# Patient Record
Sex: Male | Born: 1960 | Race: White | Hispanic: No | Smoking: Current every day smoker
Health system: Southern US, Community
[De-identification: ages and names within clinical notes are randomized; demographics above are authoritative.]

## PROBLEM LIST (undated history)

## (undated) DIAGNOSIS — F4310 Post-traumatic stress disorder, unspecified: Secondary | ICD-10-CM

## (undated) DIAGNOSIS — I639 Cerebral infarction, unspecified: Secondary | ICD-10-CM

## (undated) DIAGNOSIS — J449 Chronic obstructive pulmonary disease, unspecified: Secondary | ICD-10-CM

## (undated) DIAGNOSIS — F319 Bipolar disorder, unspecified: Secondary | ICD-10-CM

## (undated) HISTORY — PX: HIATAL HERNIA REPAIR: SHX195

## (undated) HISTORY — PX: BACK SURGERY: SHX140

## (undated) HISTORY — PX: APPENDECTOMY: SHX54

---

## 2000-09-18 ENCOUNTER — Emergency Department (HOSPITAL_COMMUNITY): Admission: EM | Admit: 2000-09-18 | Discharge: 2000-09-18 | Payer: Self-pay | Admitting: Emergency Medicine

## 2002-08-19 ENCOUNTER — Encounter: Payer: Self-pay | Admitting: Emergency Medicine

## 2002-08-19 ENCOUNTER — Emergency Department (HOSPITAL_COMMUNITY): Admission: EM | Admit: 2002-08-19 | Discharge: 2002-08-19 | Payer: Self-pay | Admitting: Emergency Medicine

## 2002-09-26 ENCOUNTER — Encounter: Payer: Self-pay | Admitting: Family Medicine

## 2002-09-26 ENCOUNTER — Ambulatory Visit (HOSPITAL_COMMUNITY): Admission: RE | Admit: 2002-09-26 | Discharge: 2002-09-26 | Payer: Self-pay | Admitting: Family Medicine

## 2003-05-04 ENCOUNTER — Inpatient Hospital Stay (HOSPITAL_COMMUNITY): Admission: AC | Admit: 2003-05-04 | Discharge: 2003-05-04 | Payer: Self-pay

## 2003-08-27 ENCOUNTER — Ambulatory Visit (HOSPITAL_COMMUNITY): Admission: RE | Admit: 2003-08-27 | Discharge: 2003-08-27 | Payer: Self-pay | Admitting: Family Medicine

## 2004-06-23 ENCOUNTER — Emergency Department (HOSPITAL_COMMUNITY): Admission: EM | Admit: 2004-06-23 | Discharge: 2004-06-23 | Payer: Self-pay | Admitting: Emergency Medicine

## 2005-02-04 ENCOUNTER — Emergency Department (HOSPITAL_COMMUNITY): Admission: EM | Admit: 2005-02-04 | Discharge: 2005-02-04 | Payer: Self-pay | Admitting: *Deleted

## 2006-12-02 ENCOUNTER — Emergency Department (HOSPITAL_COMMUNITY): Admission: EM | Admit: 2006-12-02 | Discharge: 2006-12-02 | Payer: Self-pay | Admitting: Emergency Medicine

## 2007-01-22 ENCOUNTER — Emergency Department (HOSPITAL_COMMUNITY): Admission: EM | Admit: 2007-01-22 | Discharge: 2007-01-22 | Payer: Self-pay | Admitting: Emergency Medicine

## 2007-09-22 ENCOUNTER — Emergency Department (HOSPITAL_COMMUNITY): Admission: EM | Admit: 2007-09-22 | Discharge: 2007-09-22 | Payer: Self-pay | Admitting: Emergency Medicine

## 2007-09-25 ENCOUNTER — Emergency Department (HOSPITAL_COMMUNITY): Admission: EM | Admit: 2007-09-25 | Discharge: 2007-09-26 | Payer: Self-pay | Admitting: Emergency Medicine

## 2007-09-26 ENCOUNTER — Inpatient Hospital Stay (HOSPITAL_COMMUNITY): Admission: AD | Admit: 2007-09-26 | Discharge: 2007-10-02 | Payer: Self-pay | Admitting: Psychiatry

## 2007-09-26 ENCOUNTER — Ambulatory Visit: Payer: Self-pay | Admitting: Psychiatry

## 2008-05-29 ENCOUNTER — Emergency Department (HOSPITAL_COMMUNITY): Admission: EM | Admit: 2008-05-29 | Discharge: 2008-05-29 | Payer: Self-pay | Admitting: Emergency Medicine

## 2008-06-02 ENCOUNTER — Emergency Department (HOSPITAL_COMMUNITY): Admission: EM | Admit: 2008-06-02 | Discharge: 2008-06-02 | Payer: Self-pay | Admitting: Emergency Medicine

## 2008-06-10 ENCOUNTER — Emergency Department (HOSPITAL_COMMUNITY): Admission: EM | Admit: 2008-06-10 | Discharge: 2008-06-11 | Payer: Self-pay | Admitting: Emergency Medicine

## 2008-07-01 ENCOUNTER — Emergency Department (HOSPITAL_COMMUNITY): Admission: EM | Admit: 2008-07-01 | Discharge: 2008-07-01 | Payer: Self-pay | Admitting: Emergency Medicine

## 2008-10-03 ENCOUNTER — Emergency Department (HOSPITAL_COMMUNITY): Admission: EM | Admit: 2008-10-03 | Discharge: 2008-10-03 | Payer: Self-pay | Admitting: Emergency Medicine

## 2009-02-05 ENCOUNTER — Emergency Department (HOSPITAL_COMMUNITY): Admission: EM | Admit: 2009-02-05 | Discharge: 2009-02-05 | Payer: Self-pay | Admitting: Emergency Medicine

## 2009-04-05 ENCOUNTER — Emergency Department (HOSPITAL_COMMUNITY): Admission: EM | Admit: 2009-04-05 | Discharge: 2009-04-05 | Payer: Self-pay | Admitting: Emergency Medicine

## 2009-04-30 ENCOUNTER — Emergency Department (HOSPITAL_COMMUNITY): Admission: EM | Admit: 2009-04-30 | Discharge: 2009-04-30 | Payer: Self-pay | Admitting: Emergency Medicine

## 2009-08-01 ENCOUNTER — Emergency Department (HOSPITAL_COMMUNITY): Admission: EM | Admit: 2009-08-01 | Discharge: 2009-08-01 | Payer: Self-pay | Admitting: Emergency Medicine

## 2009-08-01 ENCOUNTER — Ambulatory Visit (HOSPITAL_COMMUNITY): Admission: RE | Admit: 2009-08-01 | Discharge: 2009-08-01 | Payer: Self-pay | Admitting: Emergency Medicine

## 2009-09-29 ENCOUNTER — Inpatient Hospital Stay (HOSPITAL_COMMUNITY): Admission: AD | Admit: 2009-09-29 | Discharge: 2009-10-02 | Payer: Self-pay | Admitting: Family Medicine

## 2009-11-12 ENCOUNTER — Emergency Department (HOSPITAL_COMMUNITY): Admission: EM | Admit: 2009-11-12 | Discharge: 2009-11-12 | Payer: Self-pay | Admitting: Emergency Medicine
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
## 2010-03-26 ENCOUNTER — Emergency Department (HOSPITAL_COMMUNITY)
Admission: EM | Admit: 2010-03-26 | Discharge: 2010-03-26 | Payer: Self-pay | Source: Home / Self Care | Admitting: Emergency Medicine

## 2010-05-14 ENCOUNTER — Emergency Department (HOSPITAL_COMMUNITY): Admission: EM | Admit: 2010-05-14 | Discharge: 2010-04-04 | Payer: Self-pay | Admitting: Emergency Medicine

## 2010-06-02 ENCOUNTER — Emergency Department (HOSPITAL_COMMUNITY)
Admission: EM | Admit: 2010-06-02 | Discharge: 2010-06-02 | Payer: Self-pay | Source: Home / Self Care | Admitting: Emergency Medicine

## 2010-06-02 ENCOUNTER — Ambulatory Visit (HOSPITAL_COMMUNITY): Admission: RE | Admit: 2010-06-02 | Payer: Self-pay | Source: Home / Self Care | Admitting: Family Medicine

## 2010-06-28 ENCOUNTER — Encounter: Payer: Self-pay | Admitting: Family Medicine

## 2010-08-19 ENCOUNTER — Emergency Department (HOSPITAL_COMMUNITY)
Admission: EM | Admit: 2010-08-19 | Discharge: 2010-08-19 | Disposition: A | Discharge status: Other Acute Inpatient Hospital | Payer: Medicare Other | Attending: Emergency Medicine | Admitting: Emergency Medicine

## 2010-08-19 DIAGNOSIS — F1010 Alcohol abuse, uncomplicated: Secondary | ICD-10-CM | POA: Insufficient documentation

## 2010-08-19 DIAGNOSIS — G8929 Other chronic pain: Secondary | ICD-10-CM | POA: Insufficient documentation

## 2010-08-19 DIAGNOSIS — F3289 Other specified depressive episodes: Secondary | ICD-10-CM | POA: Insufficient documentation

## 2010-08-19 DIAGNOSIS — F329 Major depressive disorder, single episode, unspecified: Secondary | ICD-10-CM | POA: Insufficient documentation

## 2010-08-19 LAB — COMPREHENSIVE METABOLIC PANEL
ALT: 13 U/L (ref 0–53)
ALT: 24 U/L (ref 0–53)
AST: 17 U/L (ref 0–37)
AST: 28 U/L (ref 0–37)
Alkaline Phosphatase: 60 U/L (ref 39–117)
Alkaline Phosphatase: 62 U/L (ref 39–117)
CO2: 21 mEq/L (ref 19–32)
Calcium: 8.9 mg/dL (ref 8.4–10.5)
GFR calc Af Amer: >60 mL/min (ref >60)
GFR calc Af Amer: >60 mL/min (ref >60)
GFR calc non Af Amer: >60 mL/min (ref >60)
Glucose, Bld: 81 mg/dL (ref 70–99)
Glucose, Bld: 96 mg/dL (ref 70–99)
Potassium: 3.8 mEq/L (ref 3.5–5.1)
Potassium: 3.9 mEq/L (ref 3.5–5.1)
Sodium: 134 mEq/L — ABNORMAL LOW (ref 135–145)
Sodium: 140 mEq/L (ref 135–145)
Total Protein: 6.9 g/dL (ref 6.0–8.3)

## 2010-08-19 LAB — DIFFERENTIAL
Basophils Relative: 1 % (ref 0–1)
Eosinophils Absolute: 0.3 10*3/uL (ref 0.0–0.7)
Eosinophils Relative: 4 % (ref 0–5)
Lymphocytes Relative: 20 % (ref 12–46)
Lymphs Abs: 1.4 10*3/uL (ref 0.7–4.0)
Lymphs Abs: 1.8 10*3/uL (ref 0.7–4.0)
Monocytes Absolute: 0.6 10*3/uL (ref 0.1–1.0)
Monocytes Relative: 8 % (ref 3–12)
Neutro Abs: 6.3 10*3/uL (ref 1.7–7.7)
Neutrophils Relative %: 66 % (ref 43–77)
Neutrophils Relative %: 70 % (ref 43–77)

## 2010-08-19 LAB — URINALYSIS, ROUTINE W REFLEX MICROSCOPIC
Bilirubin Urine: NEGATIVE
Nitrite: NEGATIVE
Protein, ur: NEGATIVE mg/dL
Specific Gravity, Urine: 1.01 (ref 1.005–1.030)
Urobilinogen, UA: 0.2 mg/dL (ref 0.0–1.0)

## 2010-08-19 LAB — CBC
HCT: 35.2 % — ABNORMAL LOW (ref 39.0–52.0)
HCT: 43.2 % (ref 39.0–52.0)
Hemoglobin: 12.1 g/dL — ABNORMAL LOW (ref 13.0–17.0)
Hemoglobin: 15.4 g/dL (ref 13.0–17.0)
MCHC: 34.5 g/dL (ref 30.0–36.0)
MCV: 84 fL (ref 78.0–100.0)
RBC: 5.14 MIL/uL (ref 4.22–5.81)
RDW: 13.1 % (ref 11.5–15.5)
WBC: 6.9 10*3/uL (ref 4.0–10.5)
WBC: 9.1 10*3/uL (ref 4.0–10.5)

## 2010-08-19 LAB — RAPID URINE DRUG SCREEN, HOSP PERFORMED
Amphetamines: NOT DETECTED
Benzodiazepines: POSITIVE — AB
Benzodiazepines: NOT DETECTED
Opiates: NOT DETECTED
Tetrahydrocannabinol: POSITIVE — AB
Tetrahydrocannabinol: POSITIVE — AB

## 2010-08-19 LAB — LIPASE, BLOOD: Lipase: 40 U/L (ref 11–59)

## 2010-08-19 LAB — PROTIME-INR: Prothrombin Time: 14.3 seconds (ref 11.6–15.2)

## 2010-08-19 LAB — LITHIUM LEVEL: Lithium Lvl: <0.25 mEq/L — ABNORMAL LOW (ref 0.80–1.40)

## 2010-08-19 LAB — ETHANOL: Alcohol, Ethyl (B): <5 mg/dL (ref 0–10)

## 2010-08-25 LAB — CBC
Hemoglobin: 12.4 g/dL — ABNORMAL LOW (ref 13.0–17.0)
MCHC: 35.3 g/dL (ref 30.0–36.0)
RBC: 3.91 MIL/uL — ABNORMAL LOW (ref 4.22–5.81)
WBC: 13.6 10*3/uL — ABNORMAL HIGH (ref 4.0–10.5)

## 2010-08-25 LAB — COMPREHENSIVE METABOLIC PANEL
ALT: 17 U/L (ref 0–53)
AST: 23 U/L (ref 0–37)
Alkaline Phosphatase: 91 U/L (ref 39–117)
CO2: 28 mEq/L (ref 19–32)
Calcium: 8.6 mg/dL (ref 8.4–10.5)
Chloride: 90 mEq/L — ABNORMAL LOW (ref 96–112)
GFR calc Af Amer: >60 mL/min (ref >60)
GFR calc non Af Amer: >60 mL/min (ref >60)
Glucose, Bld: 114 mg/dL — ABNORMAL HIGH (ref 70–99)
Potassium: 3.5 mEq/L (ref 3.5–5.1)
Sodium: 128 mEq/L — ABNORMAL LOW (ref 135–145)

## 2010-08-25 LAB — DIFFERENTIAL
Basophils Relative: 0 % (ref 0–1)
Eosinophils Absolute: 0.2 10*3/uL (ref 0.0–0.7)
Eosinophils Relative: 1 % (ref 0–5)
Lymphs Abs: 0.8 10*3/uL (ref 0.7–4.0)
Monocytes Relative: 8 % (ref 3–12)
Neutrophils Relative %: 85 % — ABNORMAL HIGH (ref 43–77)

## 2010-08-25 LAB — CULTURE, BLOOD (ROUTINE X 2)
Culture: NO GROWTH
Report Status: 4302011
Report Status: 4302011

## 2010-08-25 LAB — TSH: TSH: 2.062 u[IU]/mL (ref 0.350–4.500)

## 2010-08-25 LAB — URINALYSIS, ROUTINE W REFLEX MICROSCOPIC
Glucose, UA: NEGATIVE mg/dL
Ketones, ur: NEGATIVE mg/dL
Leukocytes, UA: NEGATIVE
Nitrite: NEGATIVE
Specific Gravity, Urine: 1.025 (ref 1.005–1.030)
pH: 6 (ref 5.0–8.0)

## 2010-08-25 LAB — APTT: aPTT: 42 seconds — ABNORMAL HIGH (ref 24–37)

## 2010-08-25 LAB — URINE MICROSCOPIC-ADD ON

## 2010-08-26 LAB — BASIC METABOLIC PANEL
BUN: 23 mg/dL (ref 6–23)
Calcium: 8.6 mg/dL (ref 8.4–10.5)
Creatinine, Ser: 0.86 mg/dL (ref 0.4–1.5)
GFR calc non Af Amer: >60 mL/min (ref >60)
Glucose, Bld: 91 mg/dL (ref 70–99)

## 2010-08-26 LAB — DIFFERENTIAL
Basophils Absolute: 0.1 10*3/uL (ref 0.0–0.1)
Eosinophils Relative: 4 % (ref 0–5)
Lymphocytes Relative: 19 % (ref 12–46)
Neutro Abs: 6.4 10*3/uL (ref 1.7–7.7)
Neutrophils Relative %: 67 % (ref 43–77)

## 2010-08-26 LAB — CBC
Platelets: 236 10*3/uL (ref 150–400)
RDW: 14.1 % (ref 11.5–15.5)

## 2010-08-26 LAB — ETHANOL: Alcohol, Ethyl (B): <5 mg/dL (ref 0–10)

## 2010-08-26 LAB — RAPID URINE DRUG SCREEN, HOSP PERFORMED
Amphetamines: NOT DETECTED
Cocaine: NOT DETECTED
Opiates: POSITIVE — AB
Tetrahydrocannabinol: POSITIVE — AB
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
## 2011-03-02 LAB — CBC
Hemoglobin: 14.4
MCHC: 34.2
MCV: 91.4
RBC: 4.61

## 2011-03-02 LAB — PROTIME-INR
INR: 1.7 — ABNORMAL HIGH
INR: 1.1
INR: 1.1
INR: 1.3
Prothrombin Time: 14.3
Prothrombin Time: 14.8
Prothrombin Time: 16.3 — ABNORMAL HIGH

## 2011-03-02 LAB — RAPID URINE DRUG SCREEN, HOSP PERFORMED
Barbiturates: NOT DETECTED
Benzodiazepines: POSITIVE — AB
Cocaine: NOT DETECTED
Opiates: POSITIVE — AB

## 2011-03-02 LAB — DIFFERENTIAL
Basophils Absolute: 0
Basophils Relative: 0
Lymphocytes Relative: 9 — ABNORMAL LOW
Monocytes Absolute: 1.1 — ABNORMAL HIGH
Neutro Abs: 9.8 — ABNORMAL HIGH
Neutrophils Relative %: 81 — ABNORMAL HIGH

## 2011-03-02 LAB — BASIC METABOLIC PANEL
CO2: 26
Chloride: 99
Creatinine, Ser: 0.76
GFR calc Af Amer: >60

## 2011-11-08 ENCOUNTER — Emergency Department (HOSPITAL_COMMUNITY): Payer: Medicare Other

## 2011-11-08 ENCOUNTER — Encounter (HOSPITAL_COMMUNITY): Payer: Self-pay | Admitting: *Deleted

## 2011-11-08 ENCOUNTER — Emergency Department (HOSPITAL_COMMUNITY)
Admission: EM | Admit: 2011-11-08 | Discharge: 2011-11-09 | Disposition: A | Discharge status: Other Acute Inpatient Hospital | Payer: Medicare Other | Attending: Emergency Medicine | Admitting: Emergency Medicine

## 2011-11-08 DIAGNOSIS — R4182 Altered mental status, unspecified: Secondary | ICD-10-CM | POA: Insufficient documentation

## 2011-11-08 DIAGNOSIS — R45851 Suicidal ideations: Secondary | ICD-10-CM | POA: Insufficient documentation

## 2011-11-08 DIAGNOSIS — J3489 Other specified disorders of nose and nasal sinuses: Secondary | ICD-10-CM | POA: Insufficient documentation

## 2011-11-08 DIAGNOSIS — S51809A Unspecified open wound of unspecified forearm, initial encounter: Secondary | ICD-10-CM | POA: Insufficient documentation

## 2011-11-08 DIAGNOSIS — M79609 Pain in unspecified limb: Secondary | ICD-10-CM | POA: Insufficient documentation

## 2011-11-08 DIAGNOSIS — X789XXA Intentional self-harm by unspecified sharp object, initial encounter: Secondary | ICD-10-CM | POA: Insufficient documentation

## 2011-11-08 DIAGNOSIS — IMO0002 Reserved for concepts with insufficient information to code with codable children: Secondary | ICD-10-CM | POA: Insufficient documentation

## 2011-11-08 DIAGNOSIS — F22 Delusional disorders: Secondary | ICD-10-CM | POA: Insufficient documentation

## 2011-11-08 LAB — COMPREHENSIVE METABOLIC PANEL
ALT: 17 U/L (ref 0–53)
Alkaline Phosphatase: 85 U/L (ref 39–117)
BUN: 9 mg/dL (ref 6–23)
Chloride: 100 mEq/L (ref 96–112)
GFR calc Af Amer: >90 mL/min (ref >90)
Glucose, Bld: 111 mg/dL — ABNORMAL HIGH (ref 70–99)
Potassium: 3.5 mEq/L (ref 3.5–5.1)
Sodium: 136 mEq/L (ref 135–145)
Total Bilirubin: 0.5 mg/dL (ref 0.3–1.2)
Total Protein: 7.1 g/dL (ref 6.0–8.3)

## 2011-11-08 LAB — URINALYSIS, ROUTINE W REFLEX MICROSCOPIC
Ketones, ur: NEGATIVE mg/dL
Leukocytes, UA: NEGATIVE
Nitrite: NEGATIVE
Protein, ur: NEGATIVE mg/dL
pH: 6.5 (ref 5.0–8.0)

## 2011-11-08 LAB — DIFFERENTIAL
Eosinophils Absolute: 0.2 10*3/uL (ref 0.0–0.7)
Lymphocytes Relative: 23 % (ref 12–46)
Lymphs Abs: 1.7 10*3/uL (ref 0.7–4.0)
Monocytes Relative: 9 % (ref 3–12)
Neutro Abs: 4.7 10*3/uL (ref 1.7–7.7)
Neutrophils Relative %: 65 % (ref 43–77)

## 2011-11-08 LAB — URINE MICROSCOPIC-ADD ON

## 2011-11-08 LAB — CBC
Hemoglobin: 13.5 g/dL (ref 13.0–17.0)
Platelets: 269 10*3/uL (ref 150–400)
RBC: 4.53 MIL/uL (ref 4.22–5.81)
WBC: 7.3 10*3/uL (ref 4.0–10.5)

## 2011-11-08 LAB — RAPID URINE DRUG SCREEN, HOSP PERFORMED
Amphetamines: NOT DETECTED
Barbiturates: NOT DETECTED
Benzodiazepines: NOT DETECTED

## 2011-11-08 LAB — ACETAMINOPHEN LEVEL: Acetaminophen (Tylenol), Serum: <15 ug/mL (ref 10–30)

## 2011-11-08 MED ORDER — HALOPERIDOL LACTATE 5 MG/ML IJ SOLN
5.0000 mg | Freq: Once | INTRAMUSCULAR | Status: AC
  Administered 2011-11-08: 5 mg via INTRAMUSCULAR
  Filled 2011-11-08 (×2): qty 1

## 2011-11-08 MED ORDER — TETANUS-DIPHTH-ACELL PERTUSSIS 5-2.5-18.5 LF-MCG/0.5 IM SUSP
0.5000 mL | Freq: Once | INTRAMUSCULAR | Status: AC
  Administered 2011-11-08: 0.5 mL via INTRAMUSCULAR
  Filled 2011-11-08: qty 0.5

## 2011-11-08 MED ORDER — ZIPRASIDONE MESYLATE 20 MG IM SOLR
20.0000 mg | Freq: Once | INTRAMUSCULAR | Status: AC
  Administered 2011-11-08: 20 mg via INTRAMUSCULAR
  Filled 2011-11-08: qty 20

## 2011-11-08 MED ORDER — LORAZEPAM 2 MG/ML IJ SOLN
2.0000 mg | Freq: Once | INTRAMUSCULAR | Status: AC
  Administered 2011-11-08: 2 mg via INTRAMUSCULAR
  Filled 2011-11-08: qty 1

## 2011-11-08 MED ORDER — IBUPROFEN 400 MG PO TABS
600.0000 mg | ORAL_TABLET | Freq: Three times a day (TID) | ORAL | Status: DC | PRN
  Administered 2011-11-09: 600 mg via ORAL
  Filled 2011-11-08: qty 2

## 2011-11-08 MED ORDER — LORAZEPAM 1 MG PO TABS: 1.0000 mg | ORAL_TABLET | Freq: Three times a day (TID) | ORAL | Status: DC | PRN
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
## 2011-11-08 NOTE — ED Notes (Signed)
Pt calmer, aroused by voice and responds back at times, able to state that he is at hospital, states that month is May, when asked what year it is, pt will not answer
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
## 2011-11-08 NOTE — ED Notes (Signed)
Pt woke up and went into hallway into another pt's room, pt. Sat down in the other pt's room, [employer] assisted pt back to pt's room, pt became very verbal and combative towards officer, pt now back in room and is in bed

## 2011-11-08 NOTE — ED Notes (Signed)
Pt continuously talking nonstop, at times verbally threatening towards staff and to officer

## 2011-11-08 NOTE — ED Notes (Signed)
Pt much more calmer and resting in bed at this time, lab in room for blood work, [employer] remains at bedside
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
## 2011-11-08 NOTE — ED Notes (Signed)
Tommy with ACT in at pt's bedside
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
## 2011-11-09 LAB — PROTIME-INR: INR: 1.04 (ref 0.00–1.49)
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
## 2011-11-09 NOTE — ED Provider Notes (Signed)
Pt accepted to [facility].  Will transfer stable.   Laray Anger, DO 11/09/11 1045

## 2011-11-09 NOTE — Clinical Social Work Psych Note (Signed)
Craig [name] declined by Dr. Allena Katz as too acute for the unit at [facility].
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
## 2011-11-09 NOTE — Clinical Social Work Psych Note (Signed)
Mr. Grayling Congress accepted to [facility] by Dr. Darra Lis. Transport will be by [facility]. Dr. Clarene Duke in agreement with this disposition.

## 2011-11-09 NOTE — Clinical Social Work Psych Note (Signed)
Called [facility] and spoke with [place] in intake regarding the status of Craig [name]. Jill Alexanders informed Clinical research associate that Dr. Betti Cruz had requested more lab work concerning clotting  Values before he would make a decision. Information was obtained from the chart and faxed to [facility].

## 2013-01-27 DIAGNOSIS — M549 Dorsalgia, unspecified: Secondary | ICD-10-CM

## 2013-02-08 ENCOUNTER — Emergency Department (HOSPITAL_COMMUNITY)
Admission: EM | Admit: 2013-02-08 | Discharge: 2013-02-09 | Disposition: A | Discharge status: Home / Self Care | Payer: Medicare Other | Attending: Emergency Medicine | Admitting: Emergency Medicine

## 2013-02-08 ENCOUNTER — Encounter (HOSPITAL_COMMUNITY): Payer: Self-pay | Admitting: Emergency Medicine

## 2013-02-08 DIAGNOSIS — F17200 Nicotine dependence, unspecified, uncomplicated: Secondary | ICD-10-CM | POA: Insufficient documentation

## 2013-02-08 DIAGNOSIS — L237 Allergic contact dermatitis due to plants, except food: Secondary | ICD-10-CM

## 2013-02-08 DIAGNOSIS — L255 Unspecified contact dermatitis due to plants, except food: Secondary | ICD-10-CM | POA: Insufficient documentation

## 2013-02-08 DIAGNOSIS — R21 Rash and other nonspecific skin eruption: Secondary | ICD-10-CM | POA: Insufficient documentation

## 2013-02-08 DIAGNOSIS — Z79899 Other long term (current) drug therapy: Secondary | ICD-10-CM | POA: Insufficient documentation

## 2013-02-08 MED ORDER — PREDNISONE 10 MG PO TABS: 20.0000 mg | ORAL_TABLET | Freq: Every day | ORAL | Status: DC

## 2013-02-08 MED ORDER — DIPHENHYDRAMINE HCL 25 MG PO TABS: 25.0000 mg | ORAL_TABLET | Freq: Four times a day (QID) | ORAL | Status: DC

## 2013-02-08 MED ORDER — PREDNISONE 50 MG PO TABS
60.0000 mg | ORAL_TABLET | Freq: Once | ORAL | Status: AC
  Administered 2013-02-08: 60 mg via ORAL
  Filled 2013-02-08: qty 1

## 2013-02-08 MED ORDER — DIPHENHYDRAMINE HCL 25 MG PO CAPS
25.0000 mg | ORAL_CAPSULE | Freq: Once | ORAL | Status: AC
Start: 2013-02-08 — End: 2013-02-08
  Administered 2013-02-08: 25 mg via ORAL
  Filled 2013-02-08: qty 1

## 2013-02-08 MED ORDER — FAMOTIDINE 20 MG PO TABS: 20.0000 mg | ORAL_TABLET | Freq: Two times a day (BID) | ORAL | Status: DC

## 2013-02-08 MED ORDER — FAMOTIDINE 20 MG PO TABS
20.0000 mg | ORAL_TABLET | Freq: Once | ORAL | Status: AC
  Administered 2013-02-08: 20 mg via ORAL
  Filled 2013-02-08: qty 1

## 2013-02-08 NOTE — ED Notes (Signed)
Patient c/o poison ivy/oak x 1 week.  Patient has rash to neck, bilateral arms and legs.

## 2013-02-08 NOTE — ED Provider Notes (Signed)
CSN: [id number]     Arrival date & time 02/08/13  2312 History   First MD Initiated Contact with Patient 02/08/13 2319     Chief Complaint  Patient presents with  . Poison Ivy   (Consider location/radiation/quality/duration/timing/severity/associated sxs/prior Treatment) Patient is a 52 y.o. male presenting with poison ivy. The history is provided by the patient.  Poison Craig [name] This is a new problem. The current episode started in the past 7 days. The problem occurs constantly. The problem has been gradually worsening. Associated symptoms include a rash. Pertinent negatives include no chest pain, chills, coughing, fever, headaches, nausea, neck pain, sore throat, swollen glands or vomiting.   Craig [name] is a 52 y.o. male who presents to the ED with a rash that started a week ago. He states he was working in the yard cutting shrubs and got into poison ivy. He has been itching around his neck, on his arms and legs.   History reviewed. No pertinent past medical history. Past Surgical History  Procedure Laterality Date  . Back surgery     No family history on file. History  Substance Use Topics  . Smoking status: Current Every Day Smoker  . Smokeless tobacco: Not on file  . Alcohol Use: No    Review of Systems  Constitutional: Negative for fever and chills.  HENT: Negative for sore throat, trouble swallowing and neck pain.   Eyes: Negative for itching.  Respiratory: Negative for cough.   Cardiovascular: Negative for chest pain.  Gastrointestinal: Negative for nausea and vomiting.  Skin: Positive for rash.  Neurological: Negative for headaches.  Psychiatric/Behavioral: The patient is not nervous/anxious.     Allergies  Review of patient's allergies indicates no known allergies.  Home Medications   Current Outpatient Rx  Name  Route  Sig  Dispense  Refill  . Aspirin-Salicylamide-Caffeine (BC HEADACHE POWDER PO)   Oral   Take 1 Package by mouth as needed. For headache        . citalopram (CELEXA) 40 MG tablet   Oral   Take 40 mg by mouth 2 (two) times daily.         . clonazePAM (KLONOPIN) 1 MG tablet   Oral   Take 1 mg by mouth 2 (two) times daily as needed. For nerves         . fexofenadine (ALLEGRA) 60 MG tablet   Oral   Take 60 mg by mouth daily as needed. For allergies         . GABAPENTIN PO   Oral   Take 1 capsule by mouth 2 (two) times daily.         Marland Kitchen ibuprofen (ADVIL,MOTRIN) 200 MG tablet   Oral   Take 600 mg by mouth every 6 (six) hours as needed. For pain         . methadone (DOLOPHINE) 10 MG tablet   Oral   Take 10 mg by mouth 3 (three) times daily.         . methocarbamol (ROBAXIN) 500 MG tablet   Oral   Take 500 mg by mouth 2 (two) times daily.          BP 114/76  Pulse 79  Temp(Src) 97.3 F (36.3 C) (Oral)  Resp 18  Ht 5\' 7"  (1.702 m)  Wt 154 lb (69.854 kg)  BMI 24.11 kg/m2  SpO2 96% Physical Exam  Nursing note and vitals reviewed. Constitutional: He is oriented to person, place, and time. He appears well-developed and  well-nourished. No distress.  HENT:  Head: Normocephalic.  Mouth/Throat: Uvula is midline, oropharynx is clear and moist and mucous membranes are normal.  Eyes: Conjunctivae and EOM are normal.  Neck: Neck supple.  Cardiovascular: Normal rate and regular rhythm.   Pulmonary/Chest: Effort normal and breath sounds normal. He has no wheezes.  Abdominal: Soft. There is no tenderness.  Musculoskeletal: Normal range of motion.  Neurological: He is alert and oriented to person, place, and time. No cranial nerve deficit.  Skin: Rash noted.  Rash noted to neck, arms and legs. Linear and vesicular.   Psychiatric: He has a normal mood and affect.    ED Course: Patient given Prednisone, benadryl and Pepcid here in the ED  Procedures  MDM  52 y.o. male with rash and itching after exposure to poison ivy. Will treat for poison ivy. Discussed with the patient and all questioned fully answered.  He will return if any problems arise.    Medication List    TAKE these medications       diphenhydrAMINE 25 MG tablet  Commonly known as:  BENADRYL  Take 1 tablet (25 mg total) by mouth every 6 (six) hours.     famotidine 20 MG tablet  Commonly known as:  PEPCID  Take 1 tablet (20 mg total) by mouth 2 (two) times daily.     predniSONE 10 MG tablet  Commonly known as:  DELTASONE  Take 2 tablets (20 mg total) by mouth daily.      ASK your doctor about these medications       BC HEADACHE POWDER PO  Take 1 Package by mouth as needed. For headache     citalopram 40 MG tablet  Commonly known as:  CELEXA  Take 40 mg by mouth 2 (two) times daily.     clonazePAM 1 MG tablet  Commonly known as:  KLONOPIN  Take 1 mg by mouth 2 (two) times daily as needed. For nerves     fexofenadine 60 MG tablet  Commonly known as:  ALLEGRA  Take 60 mg by mouth daily as needed. For allergies     GABAPENTIN PO  Take 1 capsule by mouth 2 (two) times daily.     ibuprofen 200 MG tablet  Commonly known as:  ADVIL,MOTRIN  Take 600 mg by mouth every 6 (six) hours as needed. For pain     methadone 10 MG tablet  Commonly known as:  DOLOPHINE  Take 10 mg by mouth 3 (three) times daily.     methocarbamol 500 MG tablet  Commonly known as:  ROBAXIN  Take 500 mg by mouth 2 (two) times daily.           Janne Napoleon, [place] 02/08/13 [phone]

## 2013-02-08 NOTE — ED Provider Notes (Signed)
Medical screening examination/treatment/procedure(s) were performed by non-physician practitioner and as supervising physician I was immediately available for consultation/collaboration.   Joya Gaskins, MD 02/08/13 2351

## 2013-02-09 ENCOUNTER — Emergency Department (HOSPITAL_COMMUNITY)
Admission: EM | Admit: 2013-02-09 | Discharge: 2013-02-09 | Disposition: A | Discharge status: Home / Self Care | Payer: Medicare Other | Attending: Emergency Medicine | Admitting: Emergency Medicine

## 2013-02-09 ENCOUNTER — Encounter (HOSPITAL_COMMUNITY): Payer: Self-pay

## 2013-02-09 DIAGNOSIS — J449 Chronic obstructive pulmonary disease, unspecified: Secondary | ICD-10-CM | POA: Insufficient documentation

## 2013-02-09 DIAGNOSIS — F319 Bipolar disorder, unspecified: Secondary | ICD-10-CM | POA: Insufficient documentation

## 2013-02-09 DIAGNOSIS — Z79899 Other long term (current) drug therapy: Secondary | ICD-10-CM | POA: Insufficient documentation

## 2013-02-09 DIAGNOSIS — Z8673 Personal history of transient ischemic attack (TIA), and cerebral infarction without residual deficits: Secondary | ICD-10-CM | POA: Insufficient documentation

## 2013-02-09 DIAGNOSIS — J4489 Other specified chronic obstructive pulmonary disease: Secondary | ICD-10-CM | POA: Insufficient documentation

## 2013-02-09 DIAGNOSIS — IMO0002 Reserved for concepts with insufficient information to code with codable children: Secondary | ICD-10-CM | POA: Insufficient documentation

## 2013-02-09 DIAGNOSIS — F4310 Post-traumatic stress disorder, unspecified: Secondary | ICD-10-CM | POA: Insufficient documentation

## 2013-02-09 DIAGNOSIS — F17200 Nicotine dependence, unspecified, uncomplicated: Secondary | ICD-10-CM | POA: Insufficient documentation

## 2013-02-09 HISTORY — DX: Post-traumatic stress disorder, unspecified: F43.10

## 2013-02-09 HISTORY — DX: Chronic obstructive pulmonary disease, unspecified: J44.9

## 2013-02-09 HISTORY — DX: Bipolar disorder, unspecified: F31.9

## 2013-02-09 HISTORY — DX: Cerebral infarction, unspecified: I63.9

## 2013-02-09 LAB — CBC WITH DIFFERENTIAL/PLATELET
Basophils Absolute: 0 10*3/uL (ref 0.0–0.1)
HCT: 40.3 % (ref 39.0–52.0)
Lymphocytes Relative: 25 % (ref 12–46)
Lymphs Abs: 2.3 10*3/uL (ref 0.7–4.0)
Monocytes Absolute: 0.9 10*3/uL (ref 0.1–1.0)
Neutro Abs: 5.7 10*3/uL (ref 1.7–7.7)
RBC: 4.57 MIL/uL (ref 4.22–5.81)
RDW: 13.5 % (ref 11.5–15.5)
WBC: 9.1 10*3/uL (ref 4.0–10.5)

## 2013-02-09 LAB — COMPREHENSIVE METABOLIC PANEL
ALT: 19 U/L (ref 0–53)
AST: 26 U/L (ref 0–37)
CO2: 26 mEq/L (ref 19–32)
Chloride: 101 mEq/L (ref 96–112)
GFR calc non Af Amer: >90 mL/min (ref >90)
Sodium: 137 mEq/L (ref 135–145)
Total Bilirubin: 0.5 mg/dL (ref 0.3–1.2)

## 2013-02-09 MED ORDER — METHOCARBAMOL 500 MG PO TABS: 500.0000 mg | ORAL_TABLET | Freq: Two times a day (BID) | ORAL | Status: DC

## 2013-02-09 MED ORDER — LITHIUM CARBONATE 300 MG PO TABS: ORAL_TABLET | ORAL | Status: DC

## 2013-02-09 MED ORDER — CLONAZEPAM 1 MG PO TABS: ORAL_TABLET | ORAL | Status: DC

## 2013-02-09 MED ORDER — GABAPENTIN 300 MG PO CAPS: ORAL_CAPSULE | ORAL | Status: DC

## 2013-02-09 MED ORDER — CITALOPRAM HYDROBROMIDE 40 MG PO TABS: 40.0000 mg | ORAL_TABLET | Freq: Every day | ORAL | Status: DC

## 2013-02-09 MED ORDER — QUETIAPINE FUMARATE 100 MG PO TABS: 100.0000 mg | ORAL_TABLET | Freq: Two times a day (BID) | ORAL | Status: DC

## 2013-02-09 NOTE — ED Notes (Signed)
Pt given by d/c instructions given multiple scripts, pt left walking un-aided out of department.

## 2013-02-09 NOTE — ED Notes (Signed)
Discharge instructions given and reviewed with patient.  Patient verbalized understanding to take medications as directed and follow up with PMD as needed.  Patient ambulatory; discharged home in good condition.
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
## 2014-12-24 ENCOUNTER — Other Ambulatory Visit (HOSPITAL_COMMUNITY): Payer: Self-pay | Admitting: Family Medicine

## 2014-12-24 ENCOUNTER — Ambulatory Visit (HOSPITAL_COMMUNITY)
Admission: RE | Admit: 2014-12-24 | Discharge: 2014-12-24 | Disposition: A | Discharge status: Home / Self Care | Payer: Medicare Other | Source: Ambulatory Visit | Attending: Family Medicine | Admitting: Family Medicine

## 2014-12-24 DIAGNOSIS — R0602 Shortness of breath: Secondary | ICD-10-CM | POA: Insufficient documentation

## 2014-12-24 DIAGNOSIS — R079 Chest pain, unspecified: Secondary | ICD-10-CM

## 2015-04-29 NOTE — Patient Instructions (Addendum)
Your procedure is scheduled on: 05/05/2015  Report to [facility] at  1130  AM.  Call this number if you have problems the morning of surgery: [phone]   Do not eat food or drink liquids :After Midnight.      Take these medicines the morning of surgery with A SIP OF WATER: celexa, klonopin, pepcid, neurontin, lithium, methadone,robaxin, prednisone, seroquel.   Do not wear jewelry, make-up or nail polish.  Do not wear lotions, powders, or perfumes. You may wear deodorant.  Do not shave 48 hours prior to surgery.  Do not bring valuables to the hospital.  Contacts, dentures or bridgework may not be worn into surgery.  Leave suitcase in the car. After surgery it may be brought to your room.  For patients admitted to the hospital, checkout time is 11:00 AM the day of discharge.   Patients discharged the day of surgery will not be allowed to drive home.  :     Please read over the following fact sheets that you were given: Coughing and Deep Breathing, Surgical Site Infection Prevention, Anesthesia Post-op Instructions and Care and Recovery After Surgery    Cataract A cataract is a clouding of the lens of the eye. When a lens becomes cloudy, vision is reduced based on the degree and nature of the clouding. Many cataracts reduce vision to some degree. Some cataracts make people more near-sighted as they develop. Other cataracts increase glare. Cataracts that are ignored and become worse can sometimes look white. The white color can be seen through the pupil. CAUSES   Aging. However, cataracts may occur at any age, even in newborns.   Certain drugs.   Trauma to the eye.   Certain diseases such as diabetes.   Specific eye diseases such as chronic inflammation inside the eye or a sudden attack of a rare form of glaucoma.   Inherited or acquired medical problems.  SYMPTOMS   Gradual, progressive drop in vision in the affected eye.   Severe, rapid visual loss. This most often happens  when trauma is the cause.  DIAGNOSIS  To detect a cataract, an eye doctor examines the lens. Cataracts are best diagnosed with an exam of the eyes with the pupils enlarged (dilated) by drops.  TREATMENT  For an early cataract, vision may improve by using different eyeglasses or stronger lighting. If that does not help your vision, surgery is the only effective treatment. A cataract needs to be surgically removed when vision loss interferes with your everyday activities, such as driving, reading, or watching TV. A cataract may also have to be removed if it prevents examination or treatment of another eye problem. Surgery removes the cloudy lens and usually replaces it with a substitute lens (intraocular lens, IOL).  At a time when both you and your doctor agree, the cataract will be surgically removed. If you have cataracts in both eyes, only one is usually removed at a time. This allows the operated eye to heal and be out of danger from any possible problems after surgery (such as infection or poor wound healing). In rare cases, a cataract may be doing damage to your eye. In these cases, your caregiver may advise surgical removal right away. The vast majority of people who have cataract surgery have better vision afterward. HOME CARE INSTRUCTIONS  If you are not planning surgery, you may be asked to do the following:  Use different eyeglasses.   Use stronger or brighter lighting.   Ask  your eye doctor about reducing your medicine dose or changing medicines if it is thought that a medicine caused your cataract. Changing medicines does not make the cataract go away on its own.   Become familiar with your surroundings. Poor vision can lead to injury. Avoid bumping into things on the affected side. You are at a higher risk for tripping or falling.   Exercise extreme care when driving or operating machinery.   Wear sunglasses if you are sensitive to bright light or experiencing problems with glare.   SEEK IMMEDIATE MEDICAL CARE IF:   You have a worsening or sudden vision loss.   You notice redness, swelling, or increasing pain in the eye.   You have a fever.  Document Released: 05/24/2005 Document Revised: 05/13/2011 Document Reviewed: 01/15/2011 [facility] Patient Information 2012 [place], [place].PATIENT INSTRUCTIONS POST-ANESTHESIA  IMMEDIATELY FOLLOWING SURGERY:  Do not drive or operate machinery for the first twenty four hours after surgery.  Do not make any important decisions for twenty four hours after surgery or while taking narcotic pain medications or sedatives.  If you develop intractable nausea and vomiting or a severe headache please notify your doctor immediately.  FOLLOW-UP:  Please make an appointment with your surgeon as instructed. You do not need to follow up with anesthesia unless specifically instructed to do so.  WOUND CARE INSTRUCTIONS (if applicable):  Keep a dry clean dressing on the anesthesia/puncture wound site if there is drainage.  Once the wound has quit draining you may leave it open to air.  Generally you should leave the bandage intact for twenty four hours unless there is drainage.  If the epidural site drains for more than 36-48 hours please call the anesthesia department.  QUESTIONS?:  Please feel free to call your physician or the hospital operator if you have any questions, and they will be happy to assist you.

## 2015-04-30 ENCOUNTER — Encounter (HOSPITAL_COMMUNITY)
Admission: RE | Admit: 2015-04-30 | Discharge: 2015-04-30 | Disposition: A | Discharge status: Home / Self Care | Payer: Medicare Other | Source: Ambulatory Visit | Attending: Ophthalmology | Admitting: Ophthalmology

## 2015-05-05 ENCOUNTER — Ambulatory Visit (HOSPITAL_COMMUNITY): Admission: RE | Admit: 2015-05-05 | Payer: Medicare Other | Source: Ambulatory Visit | Admitting: Ophthalmology

## 2015-05-05 ENCOUNTER — Encounter (HOSPITAL_COMMUNITY): Admission: RE | Payer: Self-pay | Source: Ambulatory Visit

## 2015-05-05 SURGERY — PHACOEMULSIFICATION, CATARACT, WITH IOL INSERTION
Anesthesia: Monitor Anesthesia Care | Site: Eye | Laterality: Right

## 2015-05-05 MED ORDER — EPINEPHRINE HCL 1 MG/ML IJ SOLN
INTRAMUSCULAR | Status: AC
  Filled 2015-05-05: qty 1

## 2016-01-01 ENCOUNTER — Emergency Department (HOSPITAL_COMMUNITY)

## 2016-01-01 ENCOUNTER — Encounter (HOSPITAL_COMMUNITY): Payer: Self-pay

## 2016-01-01 ENCOUNTER — Inpatient Hospital Stay (HOSPITAL_COMMUNITY)
Admission: EM | Admit: 2016-01-01 | Discharge: 2016-01-06 | DRG: 190 | Attending: Family Medicine | Admitting: Family Medicine

## 2016-01-01 DIAGNOSIS — K5900 Constipation, unspecified: Secondary | ICD-10-CM | POA: Diagnosis present

## 2016-01-01 DIAGNOSIS — J9601 Acute respiratory failure with hypoxia: Secondary | ICD-10-CM | POA: Diagnosis present

## 2016-01-01 DIAGNOSIS — F4310 Post-traumatic stress disorder, unspecified: Secondary | ICD-10-CM | POA: Diagnosis present

## 2016-01-01 DIAGNOSIS — F319 Bipolar disorder, unspecified: Secondary | ICD-10-CM | POA: Diagnosis present

## 2016-01-01 DIAGNOSIS — Z8673 Personal history of transient ischemic attack (TIA), and cerebral infarction without residual deficits: Secondary | ICD-10-CM | POA: Diagnosis not present

## 2016-01-01 DIAGNOSIS — Z87891 Personal history of nicotine dependence: Secondary | ICD-10-CM

## 2016-01-01 DIAGNOSIS — J441 Chronic obstructive pulmonary disease with (acute) exacerbation: Secondary | ICD-10-CM | POA: Diagnosis present

## 2016-01-01 DIAGNOSIS — D72829 Elevated white blood cell count, unspecified: Secondary | ICD-10-CM | POA: Diagnosis present

## 2016-01-01 LAB — BASIC METABOLIC PANEL
ANION GAP: 4 — AB (ref 5–15)
BUN: 15 mg/dL (ref 6–20)
CHLORIDE: 104 mmol/L (ref 101–111)
CO2: 28 mmol/L (ref 22–32)
Calcium: 9.3 mg/dL (ref 8.9–10.3)
Creatinine, Ser: 0.69 mg/dL (ref 0.61–1.24)
GFR calc non Af Amer: >60 mL/min (ref >60)
Glucose, Bld: 102 mg/dL — ABNORMAL HIGH (ref 65–99)
POTASSIUM: 4 mmol/L (ref 3.5–5.1)
Sodium: 136 mmol/L (ref 135–145)

## 2016-01-01 LAB — HEPATIC FUNCTION PANEL
ALBUMIN: 4.4 g/dL (ref 3.5–5.0)
ALK PHOS: 83 U/L (ref 38–126)
ALT: 24 U/L (ref 17–63)
AST: 18 U/L (ref 15–41)
BILIRUBIN INDIRECT: 0.4 mg/dL (ref 0.3–0.9)
Bilirubin, Direct: 0.1 mg/dL (ref 0.1–0.5)
TOTAL PROTEIN: 7.9 g/dL (ref 6.5–8.1)
Total Bilirubin: 0.5 mg/dL (ref 0.3–1.2)

## 2016-01-01 LAB — CBC WITH DIFFERENTIAL/PLATELET
BASOS ABS: 0.1 10*3/uL (ref 0.0–0.1)
BASOS PCT: 1 %
Eosinophils Absolute: 1 10*3/uL — ABNORMAL HIGH (ref 0.0–0.7)
Eosinophils Relative: 9 %
HEMATOCRIT: 42.1 % (ref 39.0–52.0)
HEMOGLOBIN: 13.7 g/dL (ref 13.0–17.0)
LYMPHS PCT: 15 %
Lymphs Abs: 1.7 10*3/uL (ref 0.7–4.0)
MCH: 29.8 pg (ref 26.0–34.0)
MCHC: 32.5 g/dL (ref 30.0–36.0)
MCV: 91.5 fL (ref 78.0–100.0)
MONO ABS: 0.7 10*3/uL (ref 0.1–1.0)
Monocytes Relative: 7 %
NEUTROS ABS: 7.6 10*3/uL (ref 1.7–7.7)
NEUTROS PCT: 68 %
Platelets: 289 10*3/uL (ref 150–400)
RBC: 4.6 MIL/uL (ref 4.22–5.81)
RDW: 13.5 % (ref 11.5–15.5)
WBC: 11.1 10*3/uL — AB (ref 4.0–10.5)

## 2016-01-01 LAB — MRSA PCR SCREENING: MRSA BY PCR: NEGATIVE

## 2016-01-01 LAB — BRAIN NATRIURETIC PEPTIDE: B Natriuretic Peptide: 8 pg/mL (ref 0.0–100.0)

## 2016-01-01 LAB — PROCALCITONIN: Procalcitonin: <0.1 ng/mL

## 2016-01-01 LAB — TROPONIN I: Troponin I: <0.03 ng/mL (ref <0.03)

## 2016-01-01 MED ORDER — IPRATROPIUM-ALBUTEROL 0.5-2.5 (3) MG/3ML IN SOLN
3.0000 mL | RESPIRATORY_TRACT | Status: DC | PRN
  Administered 2016-01-02 – 2016-01-03 (×3): 3 mL via RESPIRATORY_TRACT
  Filled 2016-01-01 (×4): qty 3

## 2016-01-01 MED ORDER — ALBUTEROL SULFATE (2.5 MG/3ML) 0.083% IN NEBU
2.5000 mg | INHALATION_SOLUTION | Freq: Once | RESPIRATORY_TRACT | Status: AC
  Administered 2016-01-01: 2.5 mg via RESPIRATORY_TRACT
  Filled 2016-01-01: qty 3

## 2016-01-01 MED ORDER — SODIUM CHLORIDE 0.9 % IV SOLN: 250.0000 mL | INTRAVENOUS | Status: DC | PRN

## 2016-01-01 MED ORDER — PNEUMOCOCCAL VAC POLYVALENT 25 MCG/0.5ML IJ INJ
0.5000 mL | INJECTION | INTRAMUSCULAR | Status: AC
  Administered 2016-01-02: 0.5 mL via INTRAMUSCULAR
  Filled 2016-01-01: qty 0.5

## 2016-01-01 MED ORDER — LEVOFLOXACIN IN D5W 750 MG/150ML IV SOLN
750.0000 mg | Freq: Once | INTRAVENOUS | Status: AC
  Administered 2016-01-01: 750 mg via INTRAVENOUS
  Filled 2016-01-01: qty 150

## 2016-01-01 MED ORDER — GABAPENTIN 300 MG PO CAPS
300.0000 mg | ORAL_CAPSULE | Freq: Two times a day (BID) | ORAL | Status: DC
  Administered 2016-01-01 – 2016-01-06 (×10): 300 mg via ORAL
  Filled 2016-01-01 (×10): qty 1

## 2016-01-01 MED ORDER — ONDANSETRON HCL 4 MG/2ML IJ SOLN: 4.0000 mg | Freq: Four times a day (QID) | INTRAMUSCULAR | Status: DC | PRN

## 2016-01-01 MED ORDER — ACETAMINOPHEN 650 MG RE SUPP: 650.0000 mg | Freq: Four times a day (QID) | RECTAL | Status: DC | PRN

## 2016-01-01 MED ORDER — ACETAMINOPHEN 325 MG PO TABS
650.0000 mg | ORAL_TABLET | Freq: Four times a day (QID) | ORAL | Status: DC | PRN
  Administered 2016-01-03 – 2016-01-06 (×9): 650 mg via ORAL
  Filled 2016-01-01 (×9): qty 2

## 2016-01-01 MED ORDER — ENOXAPARIN SODIUM 40 MG/0.4ML ~~LOC~~ SOLN
SUBCUTANEOUS | Status: AC
  Filled 2016-01-01: qty 0.4

## 2016-01-01 MED ORDER — SODIUM CHLORIDE 0.9 % IV BOLUS (SEPSIS)
1000.0000 mL | Freq: Once | INTRAVENOUS | Status: AC
  Administered 2016-01-01: 1000 mL via INTRAVENOUS

## 2016-01-01 MED ORDER — ENOXAPARIN SODIUM 40 MG/0.4ML ~~LOC~~ SOLN
40.0000 mg | SUBCUTANEOUS | Status: DC
  Administered 2016-01-01 – 2016-01-05 (×5): 40 mg via SUBCUTANEOUS
  Filled 2016-01-01 (×4): qty 0.4

## 2016-01-01 MED ORDER — METHYLPREDNISOLONE SODIUM SUCC 125 MG IJ SOLR
60.0000 mg | Freq: Four times a day (QID) | INTRAMUSCULAR | Status: DC
  Administered 2016-01-01 – 2016-01-06 (×19): 60 mg via INTRAVENOUS
  Filled 2016-01-01 (×19): qty 2

## 2016-01-01 MED ORDER — ALBUTEROL (5 MG/ML) CONTINUOUS INHALATION SOLN
10.0000 mg/h | INHALATION_SOLUTION | Freq: Once | RESPIRATORY_TRACT | Status: AC
  Administered 2016-01-01: 10 mg/h via RESPIRATORY_TRACT
  Filled 2016-01-01: qty 20

## 2016-01-01 MED ORDER — IPRATROPIUM BROMIDE 0.02 % IN SOLN
1.0000 mg | Freq: Once | RESPIRATORY_TRACT | Status: AC
  Administered 2016-01-01: 1 mg via RESPIRATORY_TRACT
  Filled 2016-01-01: qty 5

## 2016-01-01 MED ORDER — METHYLPREDNISOLONE SODIUM SUCC 125 MG IJ SOLR
125.0000 mg | Freq: Once | INTRAMUSCULAR | Status: AC
Start: 2016-01-01 — End: 2016-01-01
  Administered 2016-01-01: 125 mg via INTRAVENOUS
  Filled 2016-01-01: qty 2

## 2016-01-01 MED ORDER — SODIUM CHLORIDE 0.9% FLUSH
3.0000 mL | INTRAVENOUS | Status: DC | PRN
  Administered 2016-01-03 (×2): 3 mL via INTRAVENOUS
  Filled 2016-01-01 (×2): qty 3

## 2016-01-01 MED ORDER — LEVOFLOXACIN IN D5W 750 MG/150ML IV SOLN
750.0000 mg | INTRAVENOUS | Status: DC
  Administered 2016-01-02 – 2016-01-05 (×4): 750 mg via INTRAVENOUS
  Filled 2016-01-01 (×4): qty 150

## 2016-01-01 MED ORDER — IOPAMIDOL (ISOVUE-370) INJECTION 76%
100.0000 mL | Freq: Once | INTRAVENOUS | Status: AC | PRN
  Administered 2016-01-01: 100 mL via INTRAVENOUS

## 2016-01-01 MED ORDER — SODIUM CHLORIDE 0.9% FLUSH
3.0000 mL | Freq: Two times a day (BID) | INTRAVENOUS | Status: DC
  Administered 2016-01-01 – 2016-01-06 (×10): 3 mL via INTRAVENOUS

## 2016-01-01 MED ORDER — ONDANSETRON HCL 4 MG PO TABS: 4.0000 mg | ORAL_TABLET | Freq: Four times a day (QID) | ORAL | Status: DC | PRN

## 2016-01-01 MED ORDER — LITHIUM CARBONATE 300 MG PO CAPS
300.0000 mg | ORAL_CAPSULE | Freq: Two times a day (BID) | ORAL | Status: DC
  Administered 2016-01-01 – 2016-01-06 (×10): 300 mg via ORAL
  Filled 2016-01-01 (×2): qty 1
  Filled 2016-01-01: qty 2
  Filled 2016-01-01: qty 1
  Filled 2016-01-01 (×4): qty 2
  Filled 2016-01-01: qty 1
  Filled 2016-01-01 (×2): qty 2
  Filled 2016-01-01 (×3): qty 1
  Filled 2016-01-01: qty 2
  Filled 2016-01-01: qty 1
  Filled 2016-01-01: qty 2
  Filled 2016-01-01 (×4): qty 1

## 2016-01-01 NOTE — Progress Notes (Signed)
Checked back with pt post continuous neb. Pt currently without audible wheeze, no obvious respiratory distress noted. Appears pt tolerated treatment well at this time. RT will continue to monitor.

## 2016-01-01 NOTE — H&P (Signed)
History and Physical    Craig [name]:[id number] DOB: [birth year] DOA: 01/01/2016  PCP: Isabella Stalling, MD   [facility]   Chief Complaint: SOB, cough   HPI: Craig [name] is a 55 y.o. male with medical history significant for bipolar 1 disorder and COPD who presents [facility] the ED from [facility] for evaluation of dyspnea and cough. Patient has history of tobacco abuse, having quit approximately 18 months ago, and is managed for COPD with albuterol MDI as needed only. He reports that over the past 2 months, he has noted insidious worsening in his dyspnea and has been using the inhaler with increasing frequency. Over this interval, he has completed 2 courses of antibiotics with prednisone, but with only minimal and transient relief. She last completed such a course approximately 2 weeks ago, and has been progressively worsening again over the past week and a half. Over the past 2 days, he has been requesting his inhaler almost every hour, and so he is brought into the ED for further evaluation. Patient denies any recent fevers, chills, chest pain, palpitations, abdominal pain, nausea, vomiting, or diarrhea. There is been no lower extremity edema, orthopnea, or PND.  ED Course: Upon arrival to the ED, patient is found to be afebrile, saturating low 90s on room air, and with vitals otherwise stable. EKG demonstrates sinus tachycardia with rate 101 and left anterior fascicular block. Troponin is undetectable and BNP is 8.0. Chest x-ray is negative for acute cardiopulmonary disease. Chemistry panel is unremarkable and CBC features a mild leukocytosis to 11,100. Patient was given nebulizer and 1 L normal saline in the emergency department but continued to be in significant respiratory distress. CT angiogram PE study was performed and negative for acute PE, but notable for bronchial wall thickening in the lower lungs suggestive of acute on chronic  bronchitis. Patient was treated with 125 mg IV Solu-Medrol and Levaquin and eventually began to improve from a respiratory standpoint in the emergency department. He remained hemodynamically stable and will be observed on the medical-surgical unit for ongoing evaluation and management of respiratory distress suspected secondary to acute exacerbation in COPD.  Review of Systems:  All other systems reviewed and apart from HPI, are negative.  Past Medical History:  Diagnosis Date  . Bipolar 1 disorder (HCC)   . COPD (chronic obstructive pulmonary disease) (HCC)   . PTSD (post-traumatic stress disorder)   . Stroke [facility])     Past Surgical History:  Procedure Laterality Date  . BACK SURGERY       reports that he has been smoking Cigarettes.  He has never used smokeless tobacco. He reports that he does not drink alcohol or use drugs.  No Known Allergies  History reviewed. No pertinent family history.   Prior to Admission medications   Medication Sig Start Date End Date Taking? Authorizing Provider  gabapentin (NEURONTIN) 300 MG capsule One tablet bid Patient taking differently: Take 300 mg by mouth 2 (two) times daily. One tablet bid 02/09/13  Yes Bethann Berkshire, MD  lithium carbonate 300 MG capsule Take 300 mg by mouth 2 (two) times daily with a meal.   Yes Historical Provider, MD    Physical Exam: Vitals:   01/01/16 1630 01/01/16 1632 01/01/16 1700 01/01/16 1748  BP: 106/86  125/74 140/72  Pulse: 104  107 103  Resp: (!) 27  24 24   Temp:      TempSrc:      SpO2: 93% 95%  93% 99%  Weight:      Height:          Constitutional: In mild respiratory distress with increased work of breathing. No pallor. Appears well-nourished, well-hydrated.  Eyes: PERTLA, lids and conjunctivae normal ENMT: Mucous membranes are moist. Posterior pharynx clear of any exudate or lesions.   Neck: normal, supple, no masses, no thyromegaly Respiratory: Diminished b/l with coarse rhonchi and occasional  expiratory wheeze. Accessory muscle recruitment.  Cardiovascular: S1 & S2 heard, regular rate and rhythm, no significant murmurs. No extremity edema. No significant JVD. Abdomen: No distension, no tenderness, no masses palpated. Bowel sounds normal.  Musculoskeletal: no clubbing / cyanosis. No joint deformity upper and lower extremities. Normal muscle tone.  Skin: no significant rashes, lesions, ulcers. Warm, dry, well-perfused. Neurologic: CN 2-12 grossly intact. Sensation intact, DTR normal. Strength 5/5 in all 4 limbs.  Psychiatric: Normal judgment and insight. Alert and oriented x 3. Normal mood and affect.     Labs on Admission: I have personally reviewed following labs and imaging studies  CBC:  Recent Labs Lab 01/01/16 1143  WBC 11.1*  NEUTROABS 7.6  HGB 13.7  HCT 42.1  MCV 91.5  PLT 289   Basic Metabolic Panel:  Recent Labs Lab 01/01/16 1143  NA 136  K 4.0  CL 104  CO2 28  GLUCOSE 102*  BUN 15  CREATININE 0.69  CALCIUM 9.3   GFR: Estimated Creatinine Clearance: 105.9 mL/min (by C-G formula based on SCr of 0.8 mg/dL). Liver Function Tests:  Recent Labs Lab 01/01/16 1219  AST 18  ALT 24  ALKPHOS 83  BILITOT 0.5  PROT 7.9  ALBUMIN 4.4   No results for input(s): LIPASE, AMYLASE in the last 168 hours. No results for input(s): AMMONIA in the last 168 hours. Coagulation Profile: No results for input(s): INR, PROTIME in the last 168 hours. Cardiac Enzymes:  Recent Labs Lab 01/01/16 1219  TROPONINI <0.03   BNP (last 3 results) No results for input(s): PROBNP in the last 8760 hours. HbA1C: No results for input(s): HGBA1C in the last 72 hours. CBG: No results for input(s): GLUCAP in the last 168 hours. Lipid Profile: No results for input(s): CHOL, HDL, LDLCALC, TRIG, CHOLHDL, LDLDIRECT in the last 72 hours. Thyroid Function Tests: No results for input(s): TSH, T4TOTAL, FREET4, T3FREE, THYROIDAB in the last 72 hours. Anemia Panel: No results for  input(s): VITAMINB12, FOLATE, FERRITIN, TIBC, IRON, RETICCTPCT in the last 72 hours. Urine analysis:    Component Value Date/Time   COLORURINE YELLOW 11/08/2011 1330   APPEARANCEUR CLEAR 11/08/2011 1330   LABSPEC 1.015 11/08/2011 1330   PHURINE 6.5 11/08/2011 1330   GLUCOSEU NEGATIVE 11/08/2011 1330   HGBUR SMALL (A) 11/08/2011 1330   BILIRUBINUR NEGATIVE 11/08/2011 1330   KETONESUR NEGATIVE 11/08/2011 1330   PROTEINUR NEGATIVE 11/08/2011 1330   UROBILINOGEN 0.2 11/08/2011 1330   NITRITE NEGATIVE 11/08/2011 1330   LEUKOCYTESUR NEGATIVE 11/08/2011 1330   Sepsis Labs: (procalcitonin:4,lacticidven:4) )No results found for this or any previous visit (from the past 240 hour(s)).   Radiological Exams on Admission: Ct Angio Chest Pe W And/or Wo Contrast  Result Date: 01/01/2016 CLINICAL DATA:  ANTERIOR CHEST PAIN X 2 DAYS WITH SHORTNESS OF BREATH AND COUGH, HX COPD EXAM: CT ANGIOGRAPHY CHEST WITH CONTRAST TECHNIQUE: Multidetector CT imaging of the chest was performed using the standard protocol during bolus administration of intravenous contrast. Multiplanar CT image reconstructions and MIPs were obtained to evaluate the vascular anatomy. CONTRAST:  100 mL of Isovue  370 intravenous contrast COMPARISON:  Current chest radiograph FINDINGS: Angiographic study: Study is degraded by motion which limits the assessment of the segmental and subsegmental vessels for pulmonary emboli. Allowing for this, there is no convincing pulmonary embolus. The great vessels are normal in caliber. No aortic dissection or plaque. Aortic branch vessels are widely patent. Neck base and axilla:  No mass or adenopathy. Mediastinum and hila: Heart is normal in size and configuration. There are minor coronary artery calcifications. Small hiatal hernia is noted. No mediastinal or hilar masses. There are several prominent shotty mediastinal and hilar lymph nodes with no discrete pathologically enlarged lymph nodes. Lungs  and pleura: There is mild bronchial wall thickening in the lower lobes, right middle lobe and left upper lobe lingula. There is no lung consolidation to suggest pneumonia. No evidence of pulmonary edema. Mild scarring is noted at the apices. No lung mass or suspicious nodule. No pleural effusion or pneumothorax. Limited upper abdomen: Low-density upper pole right renal mass measuring 5.3 cm consistent with a cyst. Otherwise unremarkable. Musculoskeletal: Moderate compression fracture of a lower thoracic vertebra with marked narrowing and partial acquired fusion across the interspace. Posterior fusion hardware noted from the visualized thoracolumbar spine to below the included field of view. No osteoblastic or osteolytic lesions. Review of the MIP images confirms the above findings. IMPRESSION: 1. Somewhat limited exam due to respiratory motion. No convincing pulmonary embolus. 2. Mild bronchial wall thickening in the lower lungs. This may be chronic. Acute bronchitis should be considered given the patient's history. 3. No evidence of pneumonia or pulmonary edema. Electronically Signed   By: Amie Portland M.D.   On: 01/01/2016 17:47  Dg Chest Portable 1 View  Result Date: 01/01/2016 CLINICAL DATA:  Shortness of breath and cough for 2 months EXAM: PORTABLE CHEST 1 VIEW COMPARISON:  December 24, 2014 FINDINGS: There is no appreciable edema or consolidation. The heart size and pulmonary vascularity are normal. No adenopathy. There is lower thoracic dextroscoliosis. There is postoperative change in the visualized upper lumbar region. IMPRESSION: No edema or consolidation. Electronically Signed   By: Bretta Bang III M.D.   On: 01/01/2016 11:55   EKG: Independently reviewed. Sinus tachycardia (rate 101), LaFB  Assessment/Plan  1. COPD with acute exacerbation  - Quit smoking in November 2015 and commended for this; managed with albuterol MDI prn only at home   - Increased dyspnea and cough over past 2 months,  only transiently improved with courses or abx and steroid, most recently 2 wks ago - CXR no infiltrate or edema; CTA PE study findings consistent with acute on chronic bronchitis  - Treated with DuoNeb, 125 mg IV Solu-Medrol, and Levaquin in ED - Continue neb treatments q4h prn, Solu-Medrol 60 mg IV q6h, and Levaquin 750 mg IV q24h  - Monitor with continuous pulse oximetry and titration FiO2 to maintain sat >92%    2. Bipolar 1 disorder  - Stable, pt denies SI, HI, or hallucinations  - Continue current management with lithium    DVT prophylaxis: sq Lovenox  Code Status: Full  Family Communication: Discussed with patient Disposition Plan: Admit to med-surg  Consults called: None  Admission status: Inpatient    Briscoe Deutscher, MD [facility] Pager [phone]  If 7PM-7AM, please contact night-coverage [email or web address] Password [facility]  01/01/2016, 6:43 PM

## 2016-01-01 NOTE — ED Notes (Signed)
Report to Morrison Old, RN third floor

## 2016-01-01 NOTE — Progress Notes (Signed)
Albuterol 2.5mg  tx currently being administered. Pt still has bilateral exp wheeze, but breath sounds are much improved from previously. No obvious respiratory distress noted at this time. RT will continue to monitor.

## 2016-01-01 NOTE — Progress Notes (Signed)
Audible wheezing heard, pt reports productive cough, white/clear thick mucus. Pt tolerating CAT at this time. RT Will continue to monitor.

## 2016-01-01 NOTE — ED Provider Notes (Signed)
[facility]-EMERGENCY DEPT Provider Note   CSN: [id number] Arrival date & time: 01/01/16  1107  First Provider Contact:  None    By signing my name below, I, Doreatha Martin, attest that this documentation has been prepared under the direction and in the presence of Shaune Pollack, MD. Electronically Signed: Doreatha Martin, ED Scribe. 01/01/16. 12:01 PM.    History   Chief Complaint Chief Complaint  Patient presents with  . Shortness of Breath    HPI Craig [name] is a 55 y.o. male with h/o COPD who presents to the Emergency Department complaining of moderate, gradually worsening productive cough for 2 months with associated rhinorrhea, SOB, RUQ pain, right-sided chest discomfort, wheezing, post-tussive emesis. Pt is incarcerated and was placed on a Z-pac and steroids, and reports this treatment provided temporary relief. He also has a PRN inhaler which provides him temporary relief. Per pt, his CP and abdominal pain is worsened with coughing. He denies leg swelling, fever.     The history is provided by the patient. No language interpreter was used.    Past Medical History:  Diagnosis Date  . Bipolar 1 disorder (HCC)   . COPD (chronic obstructive pulmonary disease) (HCC)   . PTSD (post-traumatic stress disorder)   . Stroke [facility])     Patient Active Problem List   Diagnosis Date Noted  . COPD with acute exacerbation (HCC) 01/01/2016  . Bipolar 1 disorder (HCC) 01/01/2016  . Leukocytosis 01/01/2016    Past Surgical History:  Procedure Laterality Date  . BACK SURGERY         Home Medications    Prior to Admission medications   Medication Sig Start Date End Date Taking? Authorizing Provider  gabapentin (NEURONTIN) 300 MG capsule One tablet bid Patient taking differently: Take 300 mg by mouth 2 (two) times daily. One tablet bid 02/09/13  Yes Bethann Berkshire, MD  lithium carbonate 300 MG capsule Take 300 mg by mouth 2 (two) times daily with a meal.   Yes Historical Provider, MD     Family History History reviewed. No pertinent family history.  Social History Social History  Substance Use Topics  . Smoking status: Current Every Day Smoker    Types: Cigarettes  . Smokeless tobacco: Never Used  . Alcohol use No     Comment: occ     Allergies   Review of patient's allergies indicates no known allergies.   Review of Systems Review of Systems  Constitutional: Negative for chills, fatigue and fever.  HENT: Positive for rhinorrhea. Negative for congestion.   Eyes: Negative for visual disturbance.  Respiratory: Positive for cough, shortness of breath and wheezing.   Cardiovascular: Positive for chest pain. Negative for leg swelling.  Gastrointestinal: Positive for abdominal pain and vomiting. Negative for diarrhea and nausea.  Genitourinary: Negative for dysuria and flank pain.  Musculoskeletal: Negative for neck pain and neck stiffness.  Skin: Negative for rash and wound.  Allergic/Immunologic: Negative for immunocompromised state.  Neurological: Negative for syncope, weakness and headaches.     Physical Exam Updated Vital Signs BP 138/76 (BP Location: Right Arm)   Pulse 96   Temp 98.3 F (36.8 C) (Oral)   Resp 20   Ht  (1.676 m)   Wt 186 lb 9.6 oz (84.6 kg)   SpO2 93%   BMI 30.12 kg/m   Physical Exam  Constitutional: He appears well-developed and well-nourished.  HENT:  Head: Normocephalic.  Mouth/Throat: Oropharynx is clear and moist.  Eyes: Conjunctivae are normal.  Neck: Normal range of motion.  Cardiovascular: Normal rate, regular rhythm and normal heart sounds.   No murmur heard. Pulmonary/Chest: No respiratory distress. He has wheezes. He has rhonchi.  Increased work of breathing; suprasternal retraction; diffuse wheezing and bilateral rhonchi.   Abdominal: Soft. He exhibits no distension. There is no tenderness.  Musculoskeletal: Normal range of motion.  Neurological: He is alert.  Skin: Skin is warm and dry.   Psychiatric: He has a normal mood and affect. His behavior is normal.  Nursing note and vitals reviewed.    ED Treatments / Results  Labs (all labs ordered are listed, but only abnormal results are displayed) Labs Reviewed  CBC WITH DIFFERENTIAL/PLATELET - Abnormal; Notable for the following:       Result Value   WBC 11.1 (*)    Eosinophils Absolute 1.0 (*)    All other components within normal limits  BASIC METABOLIC PANEL - Abnormal; Notable for the following:    Glucose, Bld 102 (*)    Anion gap 4 (*)    All other components within normal limits  HEPATIC FUNCTION PANEL  BRAIN NATRIURETIC PEPTIDE  TROPONIN I  PROCALCITONIN  BASIC METABOLIC PANEL    EKG  EKG Interpretation None       Radiology Ct Angio Chest Pe W And/or Wo Contrast  Result Date: 01/01/2016 CLINICAL DATA:  ANTERIOR CHEST PAIN X 2 DAYS WITH SHORTNESS OF BREATH AND COUGH, HX COPD EXAM: CT ANGIOGRAPHY CHEST WITH CONTRAST TECHNIQUE: Multidetector CT imaging of the chest was performed using the standard protocol during bolus administration of intravenous contrast. Multiplanar CT image reconstructions and MIPs were obtained to evaluate the vascular anatomy. CONTRAST:  100 mL of Isovue 370 intravenous contrast COMPARISON:  Current chest radiograph FINDINGS: Angiographic study: Study is degraded by motion which limits the assessment of the segmental and subsegmental vessels for pulmonary emboli. Allowing for this, there is no convincing pulmonary embolus. The great vessels are normal in caliber. No aortic dissection or plaque. Aortic branch vessels are widely patent. Neck base and axilla:  No mass or adenopathy. Mediastinum and hila: Heart is normal in size and configuration. There are minor coronary artery calcifications. Small hiatal hernia is noted. No mediastinal or hilar masses. There are several prominent shotty mediastinal and hilar lymph nodes with no discrete pathologically enlarged lymph nodes. Lungs and  pleura: There is mild bronchial wall thickening in the lower lobes, right middle lobe and left upper lobe lingula. There is no lung consolidation to suggest pneumonia. No evidence of pulmonary edema. Mild scarring is noted at the apices. No lung mass or suspicious nodule. No pleural effusion or pneumothorax. Limited upper abdomen: Low-density upper pole right renal mass measuring 5.3 cm consistent with a cyst. Otherwise unremarkable. Musculoskeletal: Moderate compression fracture of a lower thoracic vertebra with marked narrowing and partial acquired fusion across the interspace. Posterior fusion hardware noted from the visualized thoracolumbar spine to below the included field of view. No osteoblastic or osteolytic lesions. Review of the MIP images confirms the above findings. IMPRESSION: 1. Somewhat limited exam due to respiratory motion. No convincing pulmonary embolus. 2. Mild bronchial wall thickening in the lower lungs. This may be chronic. Acute bronchitis should be considered given the patient's history. 3. No evidence of pneumonia or pulmonary edema. Electronically Signed   By: Amie Portland M.D.   On: 01/01/2016 17:47  Dg Chest Portable 1 View  Result Date: 01/01/2016 CLINICAL DATA:  Shortness of breath and cough for 2 months EXAM: PORTABLE CHEST  1 VIEW COMPARISON:  December 24, 2014 FINDINGS: There is no appreciable edema or consolidation. The heart size and pulmonary vascularity are normal. No adenopathy. There is lower thoracic dextroscoliosis. There is postoperative change in the visualized upper lumbar region. IMPRESSION: No edema or consolidation. Electronically Signed   By: Bretta Bang III M.D.   On: 01/01/2016 11:55   Procedures Procedures (including critical care time)  DIAGNOSTIC STUDIES: Oxygen Saturation is 93% on RA, adequate by my interpretation.    COORDINATION OF CARE: 11:59 AM Discussed treatment plan with pt at bedside which includes CXR, lab work and pt agreed to plan.     Medications Ordered in ED Medications  gabapentin (NEURONTIN) capsule 300 mg (not administered)  lithium carbonate capsule 300 mg (not administered)  enoxaparin (LOVENOX) injection 40 mg (40 mg Subcutaneous Given 01/01/16 1920)  sodium chloride flush (NS) 0.9 % injection 3 mL (not administered)  sodium chloride flush (NS) 0.9 % injection 3 mL (not administered)  0.9 %  sodium chloride infusion (not administered)  acetaminophen (TYLENOL) tablet 650 mg (not administered)    Or  acetaminophen (TYLENOL) suppository 650 mg (not administered)  ondansetron (ZOFRAN) tablet 4 mg (not administered)    Or  ondansetron (ZOFRAN) injection 4 mg (not administered)  ipratropium-albuterol (DUONEB) 0.5-2.5 (3) MG/3ML nebulizer solution 3 mL (not administered)  methylPREDNISolone sodium succinate (SOLU-MEDROL) 125 mg/2 mL injection 60 mg (not administered)  levofloxacin (LEVAQUIN) IVPB 750 mg (not administered)  enoxaparin (LOVENOX) 40 MG/0.4ML injection (  Not Given 01/01/16 1930)  pneumococcal 23 valent vaccine (PNU-IMMUNE) injection 0.5 mL (not administered)  albuterol (PROVENTIL,VENTOLIN) solution continuous neb (10 mg/hr Nebulization Given 01/01/16 1224)  ipratropium (ATROVENT) nebulizer solution 1 mg (1 mg Nebulization Given 01/01/16 1223)  methylPREDNISolone sodium succinate (SOLU-MEDROL) 125 mg/2 mL injection 125 mg (125 mg Intravenous Given 01/01/16 1235)  levofloxacin (LEVAQUIN) IVPB 750 mg (0 mg Intravenous Stopped 01/01/16 1405)  albuterol (PROVENTIL) (2.5 MG/3ML) 0.083% nebulizer solution 2.5 mg (2.5 mg Nebulization Given 01/01/16 1631)  sodium chloride 0.9 % bolus 1,000 mL (0 mLs Intravenous Stopped 01/01/16 1909)  iopamidol (ISOVUE-370) 76 % injection 100 mL (100 mLs Intravenous Contrast Given 01/01/16 1720)     Initial Impression / Assessment and Plan / ED Course  I have reviewed the triage vital signs and the nursing notes.  Pertinent labs & imaging results that were available during my care  of the patient were reviewed by me and considered in my medical decision making (see chart for details).  Clinical Course    55 year old male with past medical history of COPD who presents with a several week history of progressively worsening cough and shortness of breath. Patient had mild improvement on steroids and antibiotics several weeks ago but did not have complete resolution. On arrival, patient is tachypneic with suprasternal retractions and bilateral wheezing and rhonchi. Concern for ongoing COPD exacerbation versus pneumonia. Patient also has reported pleuritic chest pain concerning for possible PE. Will start continuous nebulizer, steroids, and plan for CTA PE. Patient is in agreement with this plan.   Labs, imaging reviewed. CXR clear. CTA pe shows no PE or PNA. Pt remains tachypneic despite IV steroids, nebs. Labs otherwise unremarkable and EKG is non-ischemic. Will admit for COPD exacerbation. Levaquin given for COPD exacerbation with increased sputum production, SOB.  Final Clinical Impressions(s) / ED Diagnoses   Final diagnoses:  COPD exacerbation [facility])    New Prescriptions Current Discharge Medication List     I personally performed the services described in this  documentation, which was scribed in my presence. The recorded information has been reviewed and is accurate.      Shaune Pollack, MD 01/01/16 2017

## 2016-01-01 NOTE — ED Triage Notes (Signed)
Pt incarcerated and reports has had sob and cough x 2 months.  Reports has been taking steroids and z pack without relief.

## 2016-01-01 NOTE — Progress Notes (Signed)
Continuous pulse ox machine set up at bedside, O2 SAT 94% RA at this time.

## 2016-01-01 NOTE — ED Notes (Addendum)
Report received 

## 2016-01-01 NOTE — ED Notes (Signed)
Given Patient a Coke.

## 2016-01-02 LAB — BASIC METABOLIC PANEL
Anion gap: 9 (ref 5–15)
BUN: 11 mg/dL (ref 6–20)
CALCIUM: 9 mg/dL (ref 8.9–10.3)
CO2: 24 mmol/L (ref 22–32)
CREATININE: 0.59 mg/dL — AB (ref 0.61–1.24)
Chloride: 105 mmol/L (ref 101–111)
GFR calc non Af Amer: >60 mL/min (ref >60)
Glucose, Bld: 145 mg/dL — ABNORMAL HIGH (ref 65–99)
Potassium: 3.9 mmol/L (ref 3.5–5.1)
SODIUM: 138 mmol/L (ref 135–145)

## 2016-01-02 LAB — GLUCOSE, CAPILLARY: GLUCOSE-CAPILLARY: 231 mg/dL — AB (ref 65–99)

## 2016-01-02 MED ORDER — ALUM & MAG HYDROXIDE-SIMETH 200-200-20 MG/5ML PO SUSP
30.0000 mL | ORAL | Status: DC | PRN
  Administered 2016-01-02 – 2016-01-05 (×6): 30 mL via ORAL
  Filled 2016-01-02 (×6): qty 30

## 2016-01-02 MED ORDER — IPRATROPIUM-ALBUTEROL 0.5-2.5 (3) MG/3ML IN SOLN
3.0000 mL | Freq: Three times a day (TID) | RESPIRATORY_TRACT | Status: DC
  Administered 2016-01-02 – 2016-01-06 (×13): 3 mL via RESPIRATORY_TRACT
  Filled 2016-01-02 (×12): qty 3

## 2016-01-02 MED ORDER — LORAZEPAM 1 MG PO TABS
1.0000 mg | ORAL_TABLET | Freq: Every day | ORAL | Status: DC
  Administered 2016-01-02 – 2016-01-05 (×4): 1 mg via ORAL
  Filled 2016-01-02 (×4): qty 1

## 2016-01-02 NOTE — Progress Notes (Signed)
Patient incarcerated 7 months.smoking a COPD exacerbation with severe COPD no infiltrates currently on IV Levaquin and DuoNeb nebulizer and IV Solu-Medrol CT negative for PE Craig [name] [id number] DOB: [birth year] DOA: 01/01/2016 PCP: Isabella Stalling, MD   Physical Exam: Blood pressure 134/65, pulse (!) 106, temperature 99.4 F (37.4 C), temperature source Tympanic, resp. rate 20, height  (1.676 m), weight 84.6 kg (186 lb 9.6 oz), SpO2 92 %. Lungs show coarse rhonchi bilaterally moderate inspiratory and expiratory wheezes diminished breath sounds in the bases no rales audible heart regular rhythm no S3 or S4 no heaves thrills rubs   Investigations:  Recent Results (from the past 240 hour(s))  MRSA PCR Screening     Status: None   Collection Time: 01/01/16  8:00 PM  Result Value Ref Range Status   MRSA by PCR NEGATIVE NEGATIVE Final    Comment:        The GeneXpert MRSA Assay (FDA approved for NASAL specimens only), is one component of a comprehensive MRSA colonization surveillance program. It is not intended to diagnose MRSA infection nor to guide or monitor treatment for MRSA infections.      Basic Metabolic Panel:  Recent Labs  57/84/69 1143 01/02/16 0538  NA 136 138  K 4.0 3.9  CL 104 105  CO2 28 24  GLUCOSE 102* 145*  BUN 15 11  CREATININE 0.69 0.59*  CALCIUM 9.3 9.0   Liver Function Tests:  Recent Labs  01/01/16 1219  AST 18  ALT 24  ALKPHOS 83  BILITOT 0.5  PROT 7.9  ALBUMIN 4.4     CBC:  Recent Labs  01/01/16 1143  WBC 11.1*  NEUTROABS 7.6  HGB 13.7  HCT 42.1  MCV 91.5  PLT 289    Ct Angio Chest Pe W And/or Wo Contrast  Result Date: 01/01/2016 CLINICAL DATA:  ANTERIOR CHEST PAIN X 2 DAYS WITH SHORTNESS OF BREATH AND COUGH, HX COPD EXAM: CT ANGIOGRAPHY CHEST WITH CONTRAST TECHNIQUE: Multidetector CT imaging of the chest was performed using the standard protocol during bolus administration of intravenous contrast.  Multiplanar CT image reconstructions and MIPs were obtained to evaluate the vascular anatomy. CONTRAST:  100 mL of Isovue 370 intravenous contrast COMPARISON:  Current chest radiograph FINDINGS: Angiographic study: Study is degraded by motion which limits the assessment of the segmental and subsegmental vessels for pulmonary emboli. Allowing for this, there is no convincing pulmonary embolus. The great vessels are normal in caliber. No aortic dissection or plaque. Aortic branch vessels are widely patent. Neck base and axilla:  No mass or adenopathy. Mediastinum and hila: Heart is normal in size and configuration. There are minor coronary artery calcifications. Small hiatal hernia is noted. No mediastinal or hilar masses. There are several prominent shotty mediastinal and hilar lymph nodes with no discrete pathologically enlarged lymph nodes. Lungs and pleura: There is mild bronchial wall thickening in the lower lobes, right middle lobe and left upper lobe lingula. There is no lung consolidation to suggest pneumonia. No evidence of pulmonary edema. Mild scarring is noted at the apices. No lung mass or suspicious nodule. No pleural effusion or pneumothorax. Limited upper abdomen: Low-density upper pole right renal mass measuring 5.3 cm consistent with a cyst. Otherwise unremarkable. Musculoskeletal: Moderate compression fracture of a lower thoracic vertebra with marked narrowing and partial acquired fusion across the interspace. Posterior fusion hardware noted from the visualized thoracolumbar spine to below the included field of view. No osteoblastic or osteolytic lesions. Review of the MIP  images confirms the above findings. IMPRESSION: 1. Somewhat limited exam due to respiratory motion. No convincing pulmonary embolus. 2. Mild bronchial wall thickening in the lower lungs. This may be chronic. Acute bronchitis should be considered given the patient's history. 3. No evidence of pneumonia or pulmonary edema.  Electronically Signed   By: Amie Portland M.D.   On: 01/01/2016 17:47  Dg Chest Portable 1 View  Result Date: 01/01/2016 CLINICAL DATA:  Shortness of breath and cough for 2 months EXAM: PORTABLE CHEST 1 VIEW COMPARISON:  December 24, 2014 FINDINGS: There is no appreciable edema or consolidation. The heart size and pulmonary vascularity are normal. No adenopathy. There is lower thoracic dextroscoliosis. There is postoperative change in the visualized upper lumbar region. IMPRESSION: No edema or consolidation. Electronically Signed   By: Bretta Bang III M.D.   On: 01/01/2016 11:55     Medications:   Impression:  Principal Problem:   COPD with acute exacerbation (HCC) Active Problems:   Bipolar 1 disorder (HCC)   Leukocytosis     Plan: Add Ativan 1 mg at bedtime for sleep continue above therapy   Consultants:    Procedures   Antibiotics: Levaquin 750 IV daily         Time spent: 30 minutes   LOS: 1 day   [name] M   01/02/2016, 5:58 PM

## 2016-01-02 NOTE — Care Management Note (Signed)
Case Management Note  Patient Details  Name: Craig [name] MRN: [id number] Date of Birth: 1961/01/24  Subjective/Objective:                  Pt admitted with COPD exacerbation. Pt is from [facility] jail and will return at DC. When DC [facility] jail will need to be contacted and updated on pt needs and have DC summary faxed with updated pt meds.   Action/Plan: Will contact [facility] jail when DC'd.   Expected Discharge Date:    01/04/2016              Expected Discharge Plan:  [facility]rections Facility  In-House Referral:  NA  Discharge planning Services  CM Consult  Post Acute Care Choice:    Choice offered to:     DME Arranged:    DME Agency:     HH Arranged:    HH Agency:     Status of Service:  Completed, signed off  If discussed at [employer], dates discussed:    Additional Comments:  Malcolm Metro, RN 01/02/2016, 10:26 AM

## 2016-01-03 LAB — GLUCOSE, CAPILLARY: GLUCOSE-CAPILLARY: 155 mg/dL — AB (ref 65–99)

## 2016-01-03 LAB — PROCALCITONIN

## 2016-01-03 MED ORDER — POLYETHYLENE GLYCOL 3350 17 G PO PACK
17.0000 g | PACK | Freq: Every day | ORAL | Status: DC | PRN
  Administered 2016-01-03 – 2016-01-05 (×2): 17 g via ORAL
  Filled 2016-01-03 (×2): qty 1

## 2016-01-03 MED ORDER — DOCUSATE SODIUM 100 MG PO CAPS
100.0000 mg | ORAL_CAPSULE | Freq: Two times a day (BID) | ORAL | Status: DC
Start: 2016-01-03 — End: 2016-01-06
  Administered 2016-01-03 – 2016-01-06 (×6): 100 mg via ORAL
  Filled 2016-01-03 (×6): qty 1

## 2016-01-03 NOTE — Progress Notes (Signed)
Patient with COPD exacerbation and a 2-1/2 patient has been incarcerated not smoking as coarse rhonchi bilaterally has inspiratory and expiratory wheezes of moderate degree diminished breath sounds in the bases no rales audible patient currently improving upon Solu-Medrol 125 every 8 Levaquin 750 daily and DuoNeb nebulizer every 4 hours Will continue current therapy Craig [name] [id number] DOB: 10/09/1960 DOA: 01/01/2016 PCP: Craig Stalling, MD   Physical Exam: Blood pressure 114/73, pulse 90, temperature 98.5 F (36.9 C), temperature source Oral, resp. rate 20, height 5\' 6"  (1.676 [name]), weight 83.8 kg (184 lb 11.9 oz), SpO2 91 %. Lungs reveal coarse rhonchi bilaterally throughout lung fields moderate inspiratory and expiratory wheezes bilaterally diminished breath sounds in the bases no rales audible heart regular rhythm no S3-S4 no heaves thrills rubs   Investigations:  Recent Results (from the past 240 hour(s))  MRSA PCR Screening     Status: None   Collection Time: 01/01/16  8:00 PM  Result Value Ref Range Status   MRSA by PCR NEGATIVE NEGATIVE Final    Comment:        The GeneXpert MRSA Assay (FDA approved for NASAL specimens only), is one component of a comprehensive MRSA colonization surveillance program. It is not intended to diagnose MRSA infection nor to guide or monitor treatment for MRSA infections.      Basic Metabolic Panel:  Recent Labs  62/83/15 1143 01/02/16 0538  NA 136 138  K 4.0 3.9  CL 104 105  CO2 28 24  GLUCOSE 102* 145*  BUN 15 11  CREATININE 0.69 0.59*  CALCIUM 9.3 9.0   Liver Function Tests:  Recent Labs  01/01/16 1219  AST 18  ALT 24  ALKPHOS 83  BILITOT 0.5  PROT 7.9  ALBUMIN 4.4     CBC:  Recent Labs  01/01/16 1143  WBC 11.1*  NEUTROABS 7.6  HGB 13.7  HCT 42.1  MCV 91.5  PLT 289    Ct Angio Chest Pe W And/or Wo Contrast  Result Date: 01/01/2016 CLINICAL DATA:  ANTERIOR CHEST PAIN X 2 DAYS WITH SHORTNESS  OF BREATH AND COUGH, HX COPD EXAM: CT ANGIOGRAPHY CHEST WITH CONTRAST TECHNIQUE: Multidetector CT imaging of the chest was performed using the standard protocol during bolus administration of intravenous contrast. Multiplanar CT image reconstructions and MIPs were obtained to evaluate the vascular anatomy. CONTRAST:  100 mL of Isovue 370 intravenous contrast COMPARISON:  Current chest radiograph FINDINGS: Angiographic study: Study is degraded by motion which limits the assessment of the segmental and subsegmental vessels for pulmonary emboli. Allowing for this, there is no convincing pulmonary embolus. The great vessels are normal in caliber. No aortic dissection or plaque. Aortic branch vessels are widely patent. Neck base and axilla:  No mass or adenopathy. Mediastinum and hila: Heart is normal in size and configuration. There are minor coronary artery calcifications. Small hiatal hernia is noted. No mediastinal or hilar masses. There are several prominent shotty mediastinal and hilar lymph nodes with no discrete pathologically enlarged lymph nodes. Lungs and pleura: There is mild bronchial wall thickening in the lower lobes, right middle lobe and left upper lobe lingula. There is no lung consolidation to suggest pneumonia. No evidence of pulmonary edema. Mild scarring is noted at the apices. No lung mass or suspicious nodule. No pleural effusion or pneumothorax. Limited upper abdomen: Low-density upper pole right renal mass measuring 5.3 cm consistent with a cyst. Otherwise unremarkable. Musculoskeletal: Moderate compression fracture of a lower thoracic vertebra with marked narrowing and partial  acquired fusion across the interspace. Posterior fusion hardware noted from the visualized thoracolumbar spine to below the included field of view. No osteoblastic or osteolytic lesions. Review of the MIP images confirms the above findings. IMPRESSION: 1. Somewhat limited exam due to respiratory motion. No convincing  pulmonary embolus. 2. Mild bronchial wall thickening in the lower lungs. This may be chronic. Acute bronchitis should be considered given the patient's history. 3. No evidence of pneumonia or pulmonary edema. Electronically Signed   By: Amie Portland [name].D.   On: 01/01/2016 17:47  Dg Chest Portable 1 View  Result Date: 01/01/2016 CLINICAL DATA:  Shortness of breath and cough for 2 months EXAM: PORTABLE CHEST 1 VIEW COMPARISON:  December 24, 2014 FINDINGS: There is no appreciable edema or consolidation. The heart size and pulmonary vascularity are normal. No adenopathy. There is lower thoracic dextroscoliosis. There is postoperative change in the visualized upper lumbar region. IMPRESSION: No edema or consolidation. Electronically Signed   By: Bretta Bang III [name].D.   On: 01/01/2016 11:55     Medications:  Impression: COPD exacerbation Principal Problem:   COPD with acute exacerbation (HCC) Active Problems:   Bipolar 1 disorder (HCC)   Leukocytosis     Plan: Continue Solu-Medrol 125 IV every 8 hours. Continue DuoNeb nebulizer every 4 hours when necessary. Continue Levaquin 750 IV daily assess degree of bronchospasm and taper medicines accordingly  Consultants:    Procedures   Antibiotics: Levaquin 750 IV daily         Time spent:   LOS: 2 days   Craig [name]   01/03/2016, 8:06 AM

## 2016-01-04 LAB — GLUCOSE, CAPILLARY: Glucose-Capillary: 135 mg/dL — ABNORMAL HIGH (ref 65–99)

## 2016-01-04 NOTE — Progress Notes (Signed)
Subjective: This is a man who is in the hospital with COPD exacerbation. He says he feels a little better. He is on Solu-Medrol and Levaquin. He is still using nasal oxygen although it's off at this minute. He is still coughing but not coughing anything up. He complains of constipation. I am seeing him today for Dr. Delbert Harness  Objective: Vital signs in last 24 hours: Temp:  [98 F (36.7 C)-98.6 F (37 C)] 98.6 F (37 C) (07/29 2003) Pulse Rate:  [88-97] 97 (07/29 2003) Resp:  [20] 20 (07/29 2003) BP: (128-136)/(78-81) 136/78 (07/29 2003) SpO2:  [90 %-96 %] 92 % (07/30 0742) Weight:  [82.4 kg (181 lb 11.2 oz)] 82.4 kg (181 lb 11.2 oz) (07/30 0304) Weight change: -1.381 kg (-3 lb 0.7 oz) Last BM Date: 01/03/16  Intake/Output from previous day: 07/29 0701 - 07/30 0700 In: 630 [P.O.:480; IV Piggyback:150] Out: 1175 [Urine:1175]  PHYSICAL EXAM General appearance: alert, cooperative and mild distress Resp: rhonchi bilaterally Cardio: regular rate and rhythm, S1, S2 normal, no murmur, click, rub or gallop GI: soft, non-tender; bowel sounds normal; no masses,  no organomegaly Extremities: extremities normal, atraumatic, no cyanosis or edema  Lab Results:  Results for orders placed or performed during the hospital encounter of 01/01/16 (from the past 48 hour(s))  Procalcitonin     Status: None   Collection Time: 01/03/16  6:27 AM  Result Value Ref Range   Procalcitonin <0.10 ng/mL    Comment:        Interpretation: PCT (Procalcitonin) <= 0.5 ng/mL: Systemic infection (sepsis) is not likely. Local bacterial infection is possible. (NOTE)         ICU PCT Algorithm               Non ICU PCT Algorithm    ----------------------------     ------------------------------         PCT < 0.25 ng/mL                 PCT < 0.1 ng/mL     Stopping of antibiotics            Stopping of antibiotics       strongly encouraged.               strongly encouraged.    ----------------------------      ------------------------------       PCT level decrease by               PCT < 0.25 ng/mL       >= 80% from peak PCT       OR PCT 0.25 - 0.5 ng/mL          Stopping of antibiotics                                             encouraged.     Stopping of antibiotics           encouraged.    ----------------------------     ------------------------------       PCT level decrease by              PCT >= 0.25 ng/mL       < 80% from peak PCT        AND PCT >= 0.5 ng/mL            Continuin  g antibiotics                                              encouraged.       Continuing antibiotics            encouraged.    ----------------------------     ------------------------------     PCT level increase compared          PCT > 0.5 ng/mL         with peak PCT AND          PCT >= 0.5 ng/mL             Escalation of antibiotics                                          strongly encouraged.      Escalation of antibiotics        strongly encouraged.   Glucose, capillary     Status: Abnormal   Collection Time: 01/03/16  7:30 AM  Result Value Ref Range   Glucose-Capillary 155 (H) 65 - 99 mg/dL   Comment 1 Notify RN   Glucose, capillary     Status: Abnormal   Collection Time: 01/04/16  7:41 AM  Result Value Ref Range   Glucose-Capillary 135 (H) 65 - 99 mg/dL   Comment 1 Notify RN     ABGS No results for input(s): PHART, PO2ART, TCO2, HCO3 in the last 72 hours.  Invalid input(s): PCO2 CULTURES Recent Results (from the past 240 hour(s))  MRSA PCR Screening     Status: None   Collection Time: 01/01/16  8:00 PM  Result Value Ref Range Status   MRSA by PCR NEGATIVE NEGATIVE Final    Comment:        The GeneXpert MRSA Assay (FDA approved for NASAL specimens only), is one component of a comprehensive MRSA colonization surveillance program. It is not intended to diagnose MRSA infection nor to guide or monitor treatment for MRSA infections.    Studies/Results: No results  found.  Medications:  Prior to Admission:  Prescriptions Prior to Admission  Medication Sig Dispense Refill Last Dose  . gabapentin (NEURONTIN) 300 MG capsule One tablet bid (Patient taking differently: Take 300 mg by mouth 2 (two) times daily. One tablet bid) 60 capsule 0 01/01/2016 at Unknown time  . lithium carbonate 300 MG capsule Take 300 mg by mouth 2 (two) times daily with a meal.   01/01/2016 at Unknown time   Scheduled: . docusate sodium  100 mg Oral BID  . enoxaparin (LOVENOX) injection  40 mg Subcutaneous Q24H  . gabapentin  300 mg Oral BID  . ipratropium-albuterol  3 mL Nebulization TID  . levofloxacin (LEVAQUIN) IV  750 mg Intravenous Q24H  . lithium carbonate  300 mg Oral BID WC  . LORazepam  1 mg Oral QHS  . methylPREDNISolone (SOLU-MEDROL) injection  60 mg Intravenous Q6H  . sodium chloride flush  3 mL Intravenous Q12H   Continuous:  ZOX:WRUEAV chloride, acetaminophen **OR** acetaminophen, alum & mag hydroxide-simeth, ipratropium-albuterol, ondansetron **OR** ondansetron (ZOFRAN) IV, polyethylene glycol, sodium chloride flush  Assesment: He has COPD with exacerbation. He is doing better but not ready for discharge yet. He has acute hypoxic respiratory failure and  that's doing better as well.  His bipolar disease is stable  He complains of constipation and medications of been ordered Principal Problem:   COPD with acute exacerbation (HCC) Active Problems:   Bipolar 1 disorder (HCC)   Leukocytosis    Plan: Continue current treatments    LOS: 3 days   Craig [name] L 01/04/2016, 9:47 AM

## 2016-01-05 LAB — GLUCOSE, CAPILLARY: Glucose-Capillary: 118 mg/dL — ABNORMAL HIGH (ref 65–99)

## 2016-01-05 LAB — PROCALCITONIN

## 2016-01-05 MED ORDER — LINACLOTIDE 145 MCG PO CAPS
145.0000 ug | ORAL_CAPSULE | Freq: Every day | ORAL | Status: DC
  Administered 2016-01-06: 145 ug via ORAL
  Filled 2016-01-05: qty 1

## 2016-01-05 NOTE — Progress Notes (Signed)
Patient has improved by about 50% regarding bronchospasm has constipation we'll add Linzess appears anxious remeasured on 60 IV every 6 Craig [name] IRW:[id number] DOB: 05-Jul-1960 DOA: 01/01/2016 PCP: Isabella Stalling, MD   Physical Exam: Blood pressure 132/78, pulse 84, temperature 98.4 F (36.9 C), temperature source Oral, resp. rate 20, height 5\' 6"  (1.676 [name]), weight 81.9 kg (180 lb 9.6 oz), SpO2 (!) 89 %. Lung showed minimal to moderate intrauterine x-ray wheezes bilaterally instructor and expiratory scattered rhonchi no rales audible heart regular rhythm strips 1 rubs   Investigations:  Recent Results (from the past 240 hour(s))  MRSA PCR Screening     Status: None   Collection Time: 01/01/16  8:00 PM  Result Value Ref Range Status   MRSA by PCR NEGATIVE NEGATIVE Final    Comment:        The GeneXpert MRSA Assay (FDA approved for NASAL specimens only), is one component of a comprehensive MRSA colonization surveillance program. It is not intended to diagnose MRSA infection nor to guide or monitor treatment for MRSA infections.      Basic Metabolic Panel: No results for input(s): NA, K, CL, CO2, GLUCOSE, BUN, CREATININE, CALCIUM, MG, PHOS in the last 72 hours. Liver Function Tests: No results for input(s): AST, ALT, ALKPHOS, BILITOT, PROT, ALBUMIN in the last 72 hours.   CBC: No results for input(s): WBC, NEUTROABS, HGB, HCT, MCV, PLT in the last 72 hours.  No results found.    Medications:   Impression:  Principal Problem:   COPD with acute exacerbation (HCC) Active Problems:   Bipolar 1 disorder (HCC)   Leukocytosis     Plan: Continue Solu-Medrol 60 IV every 6 hours consider discharge in 24 hours  Consultants:    Procedures   Antibiotics: Levaquin     Time spent: 30 minutes   LOS: 4 days   Craig [name]   01/05/2016, 2:15 PM

## 2016-01-06 LAB — GLUCOSE, CAPILLARY: GLUCOSE-CAPILLARY: 130 mg/dL — AB (ref 65–99)

## 2016-01-06 MED ORDER — LEVOFLOXACIN 750 MG PO TABS
750.0000 mg | ORAL_TABLET | Freq: Every day | ORAL | Status: DC
  Administered 2016-01-06: 750 mg via ORAL
  Filled 2016-01-06: qty 1

## 2016-01-06 MED ORDER — PREDNISONE 20 MG PO TABS: ORAL_TABLET | ORAL | 1 refills | Status: DC

## 2016-01-06 MED ORDER — ALBUTEROL SULFATE HFA 108 (90 BASE) MCG/ACT IN AERS: 2.0000 | INHALATION_SPRAY | Freq: Four times a day (QID) | RESPIRATORY_TRACT | 2 refills | Status: AC | PRN

## 2016-01-06 MED ORDER — IPRATROPIUM-ALBUTEROL 0.5-2.5 (3) MG/3ML IN SOLN: 3.0000 mL | RESPIRATORY_TRACT | 1 refills | Status: DC | PRN

## 2016-01-06 NOTE — Progress Notes (Signed)
Pt ambulated approximately 500 ft without assistive device. Pt denied pain or discomfort during ambulation. O2 Saturation at rest 93% on room air and during ambulation 95%.

## 2016-01-06 NOTE — Progress Notes (Signed)
SATURATION QUALIFICATIONS: (This note is used to comply with regulatory documentation for home oxygen)  Patient Saturations on Room Air at Rest = 93%  Patient Saturations on Room Air while Ambulating = 95%   

## 2016-01-06 NOTE — Progress Notes (Signed)
PHARMACIST - PHYSICIAN COMMUNICATION DR:   Janna Arch CONCERNING: Antibiotic IV to Oral Route Change Policy  RECOMMENDATION: This patient is receiving LEVAQUIN by the intravenous route.  Based on criteria approved by the Pharmacy and Therapeutics Committee, the antibiotic(s) is/are being converted to the equivalent oral dose form(s).   DESCRIPTION: These criteria include:  Patient being treated for a respiratory tract infection, urinary tract infection, cellulitis or clostridium difficile associated diarrhea if on metronidazole  The patient is not neutropenic and does not exhibit a GI malabsorption state  The patient is eating (either orally or via tube) and/or has been taking other orally administered medications for a least 24 hours  The patient is improving clinically and has a Tmax < 100.5  If you have questions about this conversion, please contact the Pharmacy Department  [x]   [phone] )  Jeani Hawking    Valrie Hart, PharmD Clinical Pharmacist Pager:  [phone] 01/06/2016

## 2016-01-06 NOTE — Progress Notes (Signed)
Pt's IV catheter removed and intact. Pt's IV site clean dry and intact. Discharge instructions including medications were discussed with officer [facility] Department). Officer verbalized understanding of discharge instructions including medications. All questions were answered and no further questions at this time. AVS was given to officer and informed by case manager that discharge summary was faxed to the nurse at [facility]

## 2016-01-06 NOTE — Discharge Summary (Signed)
Physician Discharge Summary  Craig [name] QIH:[id number] DOB: [birth year] DOA: 01/01/2016  PCP: Isabella Stalling, MD  Admit date: 01/01/2016 Discharge date: 01/06/2016   Recommendations for Outpatient Follow-up:  Use pro-air HFA inhaler 2 puffs 4-5 times per day when necessary likewise he is instructed to take prednisone 20 mg tablets 2 tablets daily for 7 days then decrease to one tablet daily for 7 days then discontinue he likewise has DuoNeb nebulizer ordered to use 4 times a day when necessary as a supplement or a back up to the albuterol nebulizer should that not be sufficient. He is also likewise instructed to follow-up with the nurse in the jail to assess degree of bronchospasm. Discharge Diagnoses:  Principal Problem:   COPD with acute exacerbation [facility]) Active Problems:   Bipolar 1 disorder (HCC)   Leukocytosis   Discharge Condition: Good  Filed Weights   01/04/16 0304 01/05/16 0500 01/06/16 0519  Weight: 82.4 kg (181 lb 11.2 oz) 81.9 kg (180 lb 9.6 oz) 82.1 kg (181 lb)    History of present illness:  Patient is a 55 year old white male with extensive medical history fractured pelvis lumbar fusion cervical and lumbosacral 4 levels each he was admitted with a degree of significant bronchospasm has underlying severe COPD he had not been smoking for several months in incarceration he was treated with aggressive nebulizer therapy DuoNeb intravenous Solu-Medrol 125 IV every 6 hours and empiric IV Levaquin other was no distinct infiltrate on x-ray he continue improvement over 5-6 day period his steroids were weaned he was discharged on oral Proventil HFA inhaler 2 puffs 4-5 times per day when necessary with a backup of DuoNeb nebulizer should this become necessary as well as prednisone 20 mg tablets 2 tablets daily for 7 days then decrease to one tablet daily for the ensuing 7 days then discontinue is to follow-up with the nurse who sees patients while incarcerated  Hospital  Course:  See history of present illness  Procedures:    Consultations:    Discharge Instructions  Discharge Instructions    Discharge instructions    Complete by:  As directed   Discharge patient    Complete by:  As directed       Medication List    TAKE these medications   albuterol 108 (90 Base) MCG/ACT inhaler Commonly known as:  PROVENTIL HFA;VENTOLIN HFA Inhale 2 puffs into the lungs every 6 (six) hours as needed for wheezing or shortness of breath.   gabapentin 300 MG capsule Commonly known as:  NEURONTIN One tablet bid What changed:  how much to take  how to take this  when to take this  additional instructions   ipratropium-albuterol 0.5-2.5 (3) MG/3ML Soln Commonly known as:  DUONEB Take 3 mLs by nebulization every 4 (four) hours as needed.   lithium carbonate 300 MG capsule Take 300 mg by mouth 2 (two) times daily with a meal.   predniSONE 20 MG tablet Commonly known as:  DELTASONE Take 2 tabs P.O. Daily for 7 days then decrease to 1 tab P.O. Daily for 7 days then discontinue   predniSONE 20 MG tablet Commonly known as:  DELTASONE Two tabs P.O. Daily for 7 days then One tab P.O. Daily for the next 7 days then discontinue.      No Known Allergies    The results of significant diagnostics from this hospitalization (including imaging, microbiology, ancillary and laboratory) are listed below for reference.    Significant Diagnostic Studies: Ct Angio Chest  Pe W And/or Wo Contrast  Result Date: 01/01/2016 CLINICAL DATA:  ANTERIOR CHEST PAIN X 2 DAYS WITH SHORTNESS OF BREATH AND COUGH, HX COPD EXAM: CT ANGIOGRAPHY CHEST WITH CONTRAST TECHNIQUE: Multidetector CT imaging of the chest was performed using the standard protocol during bolus administration of intravenous contrast. Multiplanar CT image reconstructions and MIPs were obtained to evaluate the vascular anatomy. CONTRAST:  100 mL of Isovue 370 intravenous contrast COMPARISON:  Current chest  radiograph FINDINGS: Angiographic study: Study is degraded by motion which limits the assessment of the segmental and subsegmental vessels for pulmonary emboli. Allowing for this, there is no convincing pulmonary embolus. The great vessels are normal in caliber. No aortic dissection or plaque. Aortic branch vessels are widely patent. Neck base and axilla:  No mass or adenopathy. Mediastinum and hila: Heart is normal in size and configuration. There are minor coronary artery calcifications. Small hiatal hernia is noted. No mediastinal or hilar masses. There are several prominent shotty mediastinal and hilar lymph nodes with no discrete pathologically enlarged lymph nodes. Lungs and pleura: There is mild bronchial wall thickening in the lower lobes, right middle lobe and left upper lobe lingula. There is no lung consolidation to suggest pneumonia. No evidence of pulmonary edema. Mild scarring is noted at the apices. No lung mass or suspicious nodule. No pleural effusion or pneumothorax. Limited upper abdomen: Low-density upper pole right renal mass measuring 5.3 cm consistent with a cyst. Otherwise unremarkable. Musculoskeletal: Moderate compression fracture of a lower thoracic vertebra with marked narrowing and partial acquired fusion across the interspace. Posterior fusion hardware noted from the visualized thoracolumbar spine to below the included field of view. No osteoblastic or osteolytic lesions. Review of the MIP images confirms the above findings. IMPRESSION: 1. Somewhat limited exam due to respiratory motion. No convincing pulmonary embolus. 2. Mild bronchial wall thickening in the lower lungs. This may be chronic. Acute bronchitis should be considered given the patient's history. 3. No evidence of pneumonia or pulmonary edema. Electronically Signed   By: Amie Portland M.D.   On: 01/01/2016 17:47  Dg Chest Portable 1 View  Result Date: 01/01/2016 CLINICAL DATA:  Shortness of breath and cough for 2 months  EXAM: PORTABLE CHEST 1 VIEW COMPARISON:  December 24, 2014 FINDINGS: There is no appreciable edema or consolidation. The heart size and pulmonary vascularity are normal. No adenopathy. There is lower thoracic dextroscoliosis. There is postoperative change in the visualized upper lumbar region. IMPRESSION: No edema or consolidation. Electronically Signed   By: Bretta Bang III M.D.   On: 01/01/2016 11:55   Microbiology: Recent Results (from the past 240 hour(s))  MRSA PCR Screening     Status: None   Collection Time: 01/01/16  8:00 PM  Result Value Ref Range Status   MRSA by PCR NEGATIVE NEGATIVE Final    Comment:        The GeneXpert MRSA Assay (FDA approved for NASAL specimens only), is one component of a comprehensive MRSA colonization surveillance program. It is not intended to diagnose MRSA infection nor to guide or monitor treatment for MRSA infections.      Labs: Basic Metabolic Panel:  Recent Labs Lab 01/01/16 1143 01/02/16 0538  NA 136 138  K 4.0 3.9  CL 104 105  CO2 28 24  GLUCOSE 102* 145*  BUN 15 11  CREATININE 0.69 0.59*  CALCIUM 9.3 9.0   Liver Function Tests:  Recent Labs Lab 01/01/16 1219  AST 18  ALT 24  ALKPHOS 83  BILITOT 0.5  PROT 7.9  ALBUMIN 4.4   No results for input(s): LIPASE, AMYLASE in the last 168 hours. No results for input(s): AMMONIA in the last 168 hours. CBC:  Recent Labs Lab 01/01/16 1143  WBC 11.1*  NEUTROABS 7.6  HGB 13.7  HCT 42.1  MCV 91.5  PLT 289   Cardiac Enzymes:  Recent Labs Lab 01/01/16 1219  TROPONINI <0.03   BNP: BNP (last 3 results)  Recent Labs  01/01/16 1219  BNP 8.0    ProBNP (last 3 results) No results for input(s): PROBNP in the last 8760 hours.  CBG:  Recent Labs Lab 01/02/16 0908 01/03/16 0730 01/04/16 0741 01/05/16 0745 01/06/16 0752  GLUCAP 231* 155* 135* 118* 130*       Signed:  Alyanah Elliott M  [facility] Pager: [phone] 01/06/2016, 11:44 AM

## 2016-01-06 NOTE — Care Management Note (Signed)
Case Management Note  Patient Details  Name: Craig [name] MRN: [id number] Date of Birth: 1960-10-31  Expected Discharge Date:     01/06/2016             Expected Discharge Plan:  [facility]  In-House Referral:  NA  Discharge planning Services  CM Consult  Post Acute Care Choice:    Choice offered to:     DME Arranged:    DME Agency:     HH Arranged:    HH Agency:     Status of Service:  Completed, signed off  If discussed at [employer] of Stay Meetings, dates discussed:    Additional Comments: Pt discharging back to [facility]. Dennie Bible, RN at [facility] contacted, provided DC summary and gave clearance for return to jail. Offer in room will transport pt. No further CM needs.   Malcolm Metro, RN 01/06/2016, 12:30 PM

## 2017-02-22 ENCOUNTER — Encounter (HOSPITAL_COMMUNITY): Payer: Self-pay | Admitting: *Deleted

## 2017-02-22 ENCOUNTER — Emergency Department (HOSPITAL_COMMUNITY): Payer: Medicare Other

## 2017-02-22 ENCOUNTER — Inpatient Hospital Stay (HOSPITAL_COMMUNITY)
Admission: EM | Admit: 2017-02-22 | Discharge: 2017-03-03 | DRG: 418 | Payer: Medicare Other | Attending: Family Medicine | Admitting: Family Medicine

## 2017-02-22 DIAGNOSIS — E871 Hypo-osmolality and hyponatremia: Secondary | ICD-10-CM | POA: Diagnosis present

## 2017-02-22 DIAGNOSIS — Z79899 Other long term (current) drug therapy: Secondary | ICD-10-CM

## 2017-02-22 DIAGNOSIS — K811 Chronic cholecystitis: Secondary | ICD-10-CM

## 2017-02-22 DIAGNOSIS — I69951 Hemiplegia and hemiparesis following unspecified cerebrovascular disease affecting right dominant side: Secondary | ICD-10-CM

## 2017-02-22 DIAGNOSIS — R112 Nausea with vomiting, unspecified: Secondary | ICD-10-CM | POA: Diagnosis not present

## 2017-02-22 DIAGNOSIS — J449 Chronic obstructive pulmonary disease, unspecified: Secondary | ICD-10-CM | POA: Diagnosis present

## 2017-02-22 DIAGNOSIS — K859 Acute pancreatitis without necrosis or infection, unspecified: Secondary | ICD-10-CM

## 2017-02-22 DIAGNOSIS — F4310 Post-traumatic stress disorder, unspecified: Secondary | ICD-10-CM | POA: Diagnosis present

## 2017-02-22 DIAGNOSIS — Z981 Arthrodesis status: Secondary | ICD-10-CM

## 2017-02-22 DIAGNOSIS — R748 Abnormal levels of other serum enzymes: Secondary | ICD-10-CM | POA: Diagnosis present

## 2017-02-22 DIAGNOSIS — R740 Nonspecific elevation of levels of transaminase and lactic acid dehydrogenase [LDH]: Secondary | ICD-10-CM | POA: Diagnosis present

## 2017-02-22 DIAGNOSIS — K8010 Calculus of gallbladder with chronic cholecystitis without obstruction: Secondary | ICD-10-CM | POA: Diagnosis present

## 2017-02-22 DIAGNOSIS — F17210 Nicotine dependence, cigarettes, uncomplicated: Secondary | ICD-10-CM | POA: Diagnosis present

## 2017-02-22 DIAGNOSIS — Z23 Encounter for immunization: Secondary | ICD-10-CM

## 2017-02-22 DIAGNOSIS — R7401 Elevation of levels of liver transaminase levels: Secondary | ICD-10-CM

## 2017-02-22 DIAGNOSIS — K851 Biliary acute pancreatitis without necrosis or infection: Principal | ICD-10-CM | POA: Diagnosis present

## 2017-02-22 DIAGNOSIS — F319 Bipolar disorder, unspecified: Secondary | ICD-10-CM | POA: Diagnosis present

## 2017-02-22 DIAGNOSIS — R17 Unspecified jaundice: Secondary | ICD-10-CM

## 2017-02-22 DIAGNOSIS — K219 Gastro-esophageal reflux disease without esophagitis: Secondary | ICD-10-CM | POA: Diagnosis present

## 2017-02-22 DIAGNOSIS — R109 Unspecified abdominal pain: Secondary | ICD-10-CM | POA: Diagnosis present

## 2017-02-22 DIAGNOSIS — R944 Abnormal results of kidney function studies: Secondary | ICD-10-CM | POA: Diagnosis present

## 2017-02-22 DIAGNOSIS — J441 Chronic obstructive pulmonary disease with (acute) exacerbation: Secondary | ICD-10-CM

## 2017-02-22 LAB — URINALYSIS, ROUTINE W REFLEX MICROSCOPIC
Bacteria, UA: NONE SEEN
Bilirubin Urine: NEGATIVE
Glucose, UA: NEGATIVE mg/dL
Hgb urine dipstick: NEGATIVE
Ketones, ur: 80 mg/dL — AB
LEUKOCYTES UA: NEGATIVE
Nitrite: NEGATIVE
PH: 6 (ref 5.0–8.0)
Protein, ur: 100 mg/dL — AB
SPECIFIC GRAVITY, URINE: 1.027 (ref 1.005–1.030)

## 2017-02-22 LAB — COMPREHENSIVE METABOLIC PANEL
ALK PHOS: 115 U/L (ref 38–126)
ALT: 253 U/L — AB (ref 17–63)
AST: 127 U/L — AB (ref 15–41)
Albumin: 3.9 g/dL (ref 3.5–5.0)
Anion gap: 11 (ref 5–15)
BUN: 23 mg/dL — AB (ref 6–20)
CHLORIDE: 99 mmol/L — AB (ref 101–111)
CO2: 23 mmol/L (ref 22–32)
CREATININE: 0.74 mg/dL (ref 0.61–1.24)
Calcium: 8.1 mg/dL — ABNORMAL LOW (ref 8.9–10.3)
GFR calc Af Amer: >60 mL/min (ref >60)
GFR calc non Af Amer: >60 mL/min (ref >60)
Glucose, Bld: 116 mg/dL — ABNORMAL HIGH (ref 65–99)
Potassium: 3.6 mmol/L (ref 3.5–5.1)
Sodium: 133 mmol/L — ABNORMAL LOW (ref 135–145)
Total Bilirubin: 1.7 mg/dL — ABNORMAL HIGH (ref 0.3–1.2)
Total Protein: 7.3 g/dL (ref 6.5–8.1)

## 2017-02-22 LAB — LITHIUM LEVEL: Lithium Lvl: 0.25 mmol/L — ABNORMAL LOW (ref 0.60–1.20)

## 2017-02-22 LAB — LIPASE, BLOOD: Lipase: 212 U/L — ABNORMAL HIGH (ref 11–51)

## 2017-02-22 LAB — DIFFERENTIAL
Basophils Absolute: 0 10*3/uL (ref 0.0–0.1)
Basophils Relative: 0 %
EOS ABS: 0.1 10*3/uL (ref 0.0–0.7)
EOS PCT: 0 %
LYMPHS ABS: 1.4 10*3/uL (ref 0.7–4.0)
Lymphocytes Relative: 6 %
Monocytes Absolute: 1.6 10*3/uL — ABNORMAL HIGH (ref 0.1–1.0)
Monocytes Relative: 7 %
NEUTROS ABS: 21.8 10*3/uL — AB (ref 1.7–7.7)
NEUTROS PCT: 87 %

## 2017-02-22 LAB — CBC
HCT: 44.8 % (ref 39.0–52.0)
Hemoglobin: 15.2 g/dL (ref 13.0–17.0)
MCH: 30.1 pg (ref 26.0–34.0)
MCHC: 33.9 g/dL (ref 30.0–36.0)
MCV: 88.7 fL (ref 78.0–100.0)
PLATELETS: 247 10*3/uL (ref 150–400)
RBC: 5.05 MIL/uL (ref 4.22–5.81)
RDW: 13.3 % (ref 11.5–15.5)
WBC: 25.2 10*3/uL — AB (ref 4.0–10.5)

## 2017-02-22 LAB — BILIRUBIN, DIRECT: Bilirubin, Direct: 0.4 mg/dL (ref 0.1–0.5)

## 2017-02-22 LAB — MRSA PCR SCREENING: MRSA BY PCR: NEGATIVE

## 2017-02-22 MED ORDER — MORPHINE SULFATE (PF) 4 MG/ML IV SOLN
4.0000 mg | Freq: Once | INTRAVENOUS | Status: AC
Start: 2017-02-22 — End: 2017-02-22
  Administered 2017-02-22: 4 mg via INTRAVENOUS
  Filled 2017-02-22: qty 1

## 2017-02-22 MED ORDER — SODIUM CHLORIDE 0.9 % IV SOLN
INTRAVENOUS | Status: DC
  Administered 2017-02-22 – 2017-03-03 (×14): via INTRAVENOUS

## 2017-02-22 MED ORDER — INFLUENZA VAC SPLIT QUAD 0.5 ML IM SUSY
0.5000 mL | PREFILLED_SYRINGE | INTRAMUSCULAR | Status: AC
  Administered 2017-02-23: 0.5 mL via INTRAMUSCULAR
  Filled 2017-02-22: qty 0.5

## 2017-02-22 MED ORDER — HYDROMORPHONE HCL 1 MG/ML IJ SOLN
0.5000 mg | Freq: Four times a day (QID) | INTRAMUSCULAR | Status: DC
  Administered 2017-02-22 – 2017-03-01 (×26): 0.5 mg via INTRAVENOUS
  Filled 2017-02-22 (×26): qty 1

## 2017-02-22 MED ORDER — IOPAMIDOL (ISOVUE-300) INJECTION 61%
100.0000 mL | Freq: Once | INTRAVENOUS | Status: AC | PRN
  Administered 2017-02-22: 100 mL via INTRAVENOUS

## 2017-02-22 MED ORDER — IPRATROPIUM-ALBUTEROL 0.5-2.5 (3) MG/3ML IN SOLN: 3.0000 mL | RESPIRATORY_TRACT | Status: DC | PRN

## 2017-02-22 MED ORDER — LITHIUM CARBONATE 150 MG PO CAPS
300.0000 mg | ORAL_CAPSULE | Freq: Two times a day (BID) | ORAL | Status: DC
  Administered 2017-02-22 – 2017-03-03 (×17): 300 mg via ORAL
  Filled 2017-02-22 (×17): qty 2

## 2017-02-22 MED ORDER — SODIUM CHLORIDE 0.9 % IV BOLUS (SEPSIS)
1000.0000 mL | Freq: Once | INTRAVENOUS | Status: AC
  Administered 2017-02-22: 1000 mL via INTRAVENOUS

## 2017-02-22 MED ORDER — MORPHINE SULFATE (PF) 2 MG/ML IV SOLN
1.0000 mg | INTRAVENOUS | Status: DC | PRN
  Administered 2017-02-22 – 2017-02-23 (×2): 2 mg via INTRAVENOUS
  Administered 2017-02-23: 1 mg via INTRAVENOUS
  Administered 2017-02-25 – 2017-03-01 (×5): 2 mg via INTRAVENOUS
  Filled 2017-02-22 (×8): qty 1

## 2017-02-22 MED ORDER — OXYCODONE HCL 5 MG PO TABS
10.0000 mg | ORAL_TABLET | Freq: Four times a day (QID) | ORAL | Status: DC | PRN
  Administered 2017-02-23 – 2017-02-27 (×14): 10 mg via ORAL
  Filled 2017-02-22 (×15): qty 2

## 2017-02-22 MED ORDER — ONDANSETRON HCL 4 MG/2ML IJ SOLN
4.0000 mg | Freq: Four times a day (QID) | INTRAMUSCULAR | Status: DC | PRN
  Administered 2017-02-28: 4 mg via INTRAVENOUS
  Filled 2017-02-22: qty 2

## 2017-02-22 MED ORDER — ONDANSETRON HCL 4 MG/2ML IJ SOLN
4.0000 mg | Freq: Four times a day (QID) | INTRAMUSCULAR | Status: DC
  Administered 2017-02-22 – 2017-03-03 (×33): 4 mg via INTRAVENOUS
  Filled 2017-02-22 (×34): qty 2

## 2017-02-22 MED ORDER — PIPERACILLIN-TAZOBACTAM 3.375 G IVPB
3.3750 g | Freq: Three times a day (TID) | INTRAVENOUS | Status: DC
  Administered 2017-02-22 – 2017-03-02 (×24): 3.375 g via INTRAVENOUS
  Filled 2017-02-22 (×24): qty 50

## 2017-02-22 MED ORDER — ENOXAPARIN SODIUM 40 MG/0.4ML ~~LOC~~ SOLN
40.0000 mg | SUBCUTANEOUS | Status: DC
  Administered 2017-02-22 – 2017-03-03 (×9): 40 mg via SUBCUTANEOUS
  Filled 2017-02-22 (×10): qty 0.4

## 2017-02-22 MED ORDER — TRAMADOL HCL 50 MG PO TABS
50.0000 mg | ORAL_TABLET | Freq: Four times a day (QID) | ORAL | Status: DC | PRN
  Administered 2017-02-22 – 2017-03-03 (×3): 50 mg via ORAL
  Filled 2017-02-22 (×4): qty 1

## 2017-02-22 MED ORDER — ONDANSETRON HCL 4 MG/2ML IJ SOLN
4.0000 mg | Freq: Once | INTRAMUSCULAR | Status: AC
  Administered 2017-02-22: 4 mg via INTRAVENOUS
  Filled 2017-02-22: qty 2

## 2017-02-22 NOTE — H&P (Addendum)
History and Physical    Craig [name] ZOX:[id number] DOB: 11-29-1960 DOA: 02/22/2017  Referring MD/NP/PA: Dr. Preston Fleeting  PCP: Oval Linsey, MD   Patient coming from: jail   Chief Complaint: abd pain   HPI: Craig [name] is a 56 y.o. male with known bipolar disorder, PTSD, COPD, presented to [facility] emergency department with main concern of progressively worsening generalized abdominal pain, 3 days in duration. Patient reports pain initially intermittent and started in epigastric area however, progressed to constant and sharp pain in the past 24 hours, associated with nausea and nonbloody vomiting, inability to keep food down. Patient reports pain is occasionally radiating to the back and he denies any specific alleviating factors. Patient denies chest pain or shortness of breath, no recent similar episodes.  ED Course: Patient clinically stable, vital signs notable for temperature 99.6 F, heart rate up to 1 16 bpm, otherwise vital signs unremarkable. Blood work notable for WBC. 25K, sodium 133, BUN 23. LFTs elevated, lipase greater than 200. TRH asked to admit for further evaluation.  Review of Systems:  Constitutional: Negative for fever, chills, diaphoresis, activity change HENT: Negative for ear pain, nosebleeds, congestion, facial swelling, rhinorrhea, neck pain, neck stiffness and ear discharge.   Eyes: Negative for pain, discharge, redness, itching and visual disturbance.  Respiratory: Negative for cough, choking, chest tightness, shortness of breath, wheezing and stridor.   Cardiovascular: Negative for chest pain, palpitations and leg swelling.  Gastrointestinal: Per HPI Genitourinary: Negative for dysuria, urgency, frequency, hematuria, flank pain, decreased urine volume, difficulty urinating and dyspareunia.  Musculoskeletal: Negative for back pain, joint swelling, arthralgias and gait problem.  Neurological: Negative for dizziness, tremors, seizures, syncope, facial  asymmetry, speech difficulty, weakness, light-headedness, numbness and headaches.  Hematological: Negative for adenopathy. Does not bruise/bleed easily.  Psychiatric/Behavioral: Negative for hallucinations, behavioral problems, confusion, dysphoric mood, decreased concentration and agitation.   Past Medical History:  Diagnosis Date  . Bipolar 1 disorder (HCC)   . COPD (chronic obstructive pulmonary disease) (HCC)   . PTSD (post-traumatic stress disorder)   . Stroke [facility])     Past Surgical History:  Procedure Laterality Date  . BACK SURGERY     Social Hx:  reports that he has been smoking Cigarettes.  He has never used smokeless tobacco. He reports that he does not drink alcohol or use drugs.  No Known Allergies  Patient denise any family history of cardiac illnesses.   Prior to Admission medications   Medication Sig Start Date End Date Taking? Authorizing Provider  albuterol (PROVENTIL HFA;VENTOLIN HFA) 108 (90 Base) MCG/ACT inhaler Inhale 2 puffs into the lungs every 6 (six) hours as needed for wheezing or shortness of breath. 01/06/16  Yes Oval Linsey, MD  gabapentin (NEURONTIN) 300 MG capsule One tablet bid Patient taking differently: Take 300 mg by mouth 2 (two) times daily. One tablet bid 02/09/13  Yes Bethann Berkshire, MD  ipratropium-albuterol (DUONEB) 0.5-2.5 (3) MG/3ML SOLN Take 3 mLs by nebulization every 4 (four) hours as needed. 01/06/16  Yes Dondiego, Gerlene Burdock, MD  lithium carbonate 300 MG capsule Take 300 mg by mouth 2 (two) times daily with a meal.   Yes [provider]    Physical Exam: Vitals:   02/22/17 0630 02/22/17 0830 02/22/17 0854 02/22/17 0908  BP: 116/82 103/69 111/72   Pulse: (!) 107 100 (!) 106   Resp:   17   Temp:   98.2 F (36.8 C) 98.3 F (36.8 C)  TempSrc:   Oral Oral  SpO2:  96% 93% 93%   Weight:      Height:        Constitutional: NAD, calm, pleasant but disheveled  Vitals:   02/22/17 0630 02/22/17 0830 02/22/17 0854 02/22/17 0908   BP: 116/82 103/69 111/72   Pulse: (!) 107 100 (!) 106   Resp:   17   Temp:   98.2 F (36.8 C) 98.3 F (36.8 C)  TempSrc:   Oral Oral  SpO2: 96% 93% 93%   Weight:      Height:       Eyes: PERRL, lids and conjunctivae normal ENMT: Mucous membranes are dry. Posterior pharynx clear of any exudate or lesions.Normal dentition.  Neck: normal, supple, no masses, no thyromegaly Respiratory: clear to auscultation bilaterally, no wheezing, no crackles. Normal respiratory effort. No accessory muscle use.  Cardiovascular: Regular rate and rhythm, no murmurs / rubs / gallops. No carotid bruits.  Abdomen: tender in epigastric area with voluntary guarding, normal BS  Musculoskeletal: no clubbing / cyanosis. No joint deformity upper and lower extremities. Good ROM, no contractures. Normal muscle tone.  Skin: normal turgor  Neurologic: CN 2-12 grossly intact. Sensation intact, DTR normal. Strength 5/5 in all 4.  Psychiatric: Alert and oriented x 3. Normal mood.   Labs on Admission: I have personally reviewed following labs and imaging studies  CBC:  Recent Labs Lab 02/22/17 0205  WBC 25.2*  NEUTROABS 21.8*  HGB 15.2  HCT 44.8  MCV 88.7  PLT 247   Basic Metabolic Panel:  Recent Labs Lab 02/22/17 0205  NA 133*  K 3.6  CL 99*  CO2 23  GLUCOSE 116*  BUN 23*  CREATININE 0.74  CALCIUM 8.1*   Liver Function Tests:  Recent Labs Lab 02/22/17 0205  AST 127*  ALT 253*  ALKPHOS 115  BILITOT 1.7*  PROT 7.3  ALBUMIN 3.9    Recent Labs Lab 02/22/17 0205  LIPASE 212*   Urine analysis:    Component Value Date/Time   COLORURINE AMBER (A) 02/22/2017 0206   APPEARANCEUR CLEAR 02/22/2017 0206   LABSPEC 1.027 02/22/2017 0206   PHURINE 6.0 02/22/2017 0206   GLUCOSEU NEGATIVE 02/22/2017 0206   HGBUR NEGATIVE 02/22/2017 0206   BILIRUBINUR NEGATIVE 02/22/2017 0206   KETONESUR 80 (A) 02/22/2017 0206   PROTEINUR 100 (A) 02/22/2017 0206   UROBILINOGEN 0.2 11/08/2011 1330   NITRITE  NEGATIVE 02/22/2017 0206   LEUKOCYTESUR NEGATIVE 02/22/2017 0206   Radiological Exams on Admission: Ct Abdomen Pelvis W Contrast Result Date: 02/22/2017 Findings most consistent with pancreatitis. No evidence of calcified obstructing common bile duct stone or pancreatic mass. Evaluation for pancreatic lesion limited secondary to the degree of inflammation. Fluid throughout portions of the abdomen and pelvis felt most likely related to the pancreatitis. Secondary moderate inflammation of the duodenum. No venous thrombosis noted. Aortic Atherosclerosis (ICD10-I70.0). Right renal cysts largest 6 cm. Renal scarring greater on the right. No hydronephrosis. Small hiatal hernia. Circumferential thickening distal esophagus may be related to under distension/reflux. Limited for evaluating for mass secondary to under distension. Extensive surgery with fusion T12 through S1 and corpectomy L4 level. Remote T10 compression fracture with anterior wedge configuration with 90% loss height anteriorly with kyphosis centered at this level with the cord draped over this region. Prominent T11-12 degenerative changes.   EKG: pending  Assessment/Plan Active Problems:   Acute pancreatitis, leukocytosis - unclear etiology at this time, patient denies alcohol use, no clear acute findings on CT abdomen that would explain this  - I wonder  if abdominal ultrasound would offer better view didn't CT abdomen with contrast - I am worried with significantly elevated WBC 25,000 as well as low-grade fevers 99.6 F - I will consult with gastroenterologist on call - Will also place on empiric antibiotic Zosyn for now and narrow down as clinically indicated - We'll place on IV fluids, provide analgesia and antiemetics as needed - Slowly advance diet based on clinical progress - Repeat lipase, CBC, LFTs in the morning      Transaminitis - Suspect this is related to the above processes but etiology remains unclear - We'll check  hepatitis panel, repeat CMP in the morning - Follow-up on gastroenterologist recommendations    Hyponatremia, elevated BUN - Suspect prerenal etiology in the setting of dehydration, poor oral intake, acute pancreatitis - Place on IV fluids and slowly advance diet as patient able to tolerate - Repeat BMP in the morning    Bipolar disorder - continue Lithium, check level  DVT prophylaxis: Lovenox SQ Code Status: Full  Family Communication: Pt updated at bedside Disposition Plan: admit to medical unit, will likely go back to jail  Consults called: GI  Admission status: Inpatient    Debbora Presto MD [facility] Pager [phone]  If 7PM-7AM, please contact night-coverage [email or web address] Password [facility]  02/22/2017, 9:36 AM

## 2017-02-22 NOTE — ED Provider Notes (Signed)
[facility]ENCY DEPT Provider Note   CSN: [id number] Arrival date & time: 02/22/17  0154     History   Chief Complaint Chief Complaint  Patient presents with  . Emesis    HPI Craig [name] is a 56 y.o. male.  The history is provided by the patient.  Emesis    He Complains of generalized abdominal pain with vomiting for the last 3 days. He has not been having a hold anything down. He denies fever or chills. Denies constipation or diarrhea. Abdominal pain is severe and he rates it at 8/10. Pain does radiate to the back, but not to the chest or shoulder. He has never had anything like this before. He has not treated it with anything.  Past Medical History:  Diagnosis Date  . Bipolar 1 disorder (HCC)   . COPD (chronic obstructive pulmonary disease) (HCC)   . PTSD (post-traumatic stress disorder)   . Stroke [facility])     Patient Active Problem List   Diagnosis Date Noted  . COPD with acute exacerbation (HCC) 01/01/2016  . Bipolar 1 disorder (HCC) 01/01/2016  . Leukocytosis 01/01/2016    Past Surgical History:  Procedure Laterality Date  . BACK SURGERY         Home Medications    Prior to Admission medications   Medication Sig Start Date End Date Taking? Authorizing Provider  gabapentin (NEURONTIN) 300 MG capsule One tablet bid Patient taking differently: Take 300 mg by mouth 2 (two) times daily. One tablet bid 02/09/13  Yes Bethann Berkshire, MD  lithium carbonate 300 MG capsule Take 300 mg by mouth 2 (two) times daily with a meal.   Yes [provider]  albuterol (PROVENTIL HFA;VENTOLIN HFA) 108 (90 Base) MCG/ACT inhaler Inhale 2 puffs into the lungs every 6 (six) hours as needed for wheezing or shortness of breath. 01/06/16   Oval Linsey, MD  ipratropium-albuterol (DUONEB) 0.5-2.5 (3) MG/3ML SOLN Take 3 mLs by nebulization every 4 (four) hours as needed. 01/06/16   Oval Linsey, MD    Family History History reviewed. No pertinent family  history.  Social History Social History  Substance Use Topics  . Smoking status: Current Every Day Smoker    Types: Cigarettes  . Smokeless tobacco: Never Used  . Alcohol use No     Comment: occ     Allergies   Patient has no known allergies.   Review of Systems Review of Systems  Gastrointestinal: Positive for vomiting.  All other systems reviewed and are negative.    Physical Exam Updated Vital Signs BP 111/87   Pulse (!) 112   Temp 99.6 F (37.6 C) (Oral)   Resp 18   Ht  (1.676 m)   Wt 95.7 kg (211 lb)   SpO2 98%   BMI 34.06 kg/m   Physical Exam  Nursing note and vitals reviewed.  56 year old male, resting comfortably and in no acute distress. Vital signs are significant for tachycardia. Oxygen saturation is 98%, which is normal. Head is normocephalic and atraumatic. PERRLA, EOMI. Oropharynx is clear. Neck is nontender and supple without adenopathy or JVD. Back is nontender and there is no CVA tenderness. Lungs are clear without rales, wheezes, or rhonchi. Chest is nontender. Heart has regular rate and rhythm without murmur. Abdomen is soft, flat, with diffuse tenderness. There is no rebound or guarding. There are no masses or hepatosplenomegaly and peristalsis is hypoactive. Extremities have no cyanosis or edema, full range of motion is present. Skin is  warm and dry without rash. Neurologic: Mental status is normal, cranial nerves are intact, there are no motor or sensory deficits.  ED Treatments / Results  Labs (all labs ordered are listed, but only abnormal results are displayed) Labs Reviewed  LIPASE, BLOOD - Abnormal; Notable for the following:       Result Value   Lipase 212 (*)    All other components within normal limits  COMPREHENSIVE METABOLIC PANEL - Abnormal; Notable for the following:    Sodium 133 (*)    Chloride 99 (*)    Glucose, Bld 116 (*)    BUN 23 (*)    Calcium 8.1 (*)    AST 127 (*)    ALT 253 (*)    Total Bilirubin 1.7  (*)    All other components within normal limits  CBC - Abnormal; Notable for the following:    WBC 25.2 (*)    All other components within normal limits  URINALYSIS, ROUTINE W REFLEX MICROSCOPIC - Abnormal; Notable for the following:    Color, Urine AMBER (*)    Ketones, ur 80 (*)    Protein, ur 100 (*)    Squamous Epithelial / LPF 0-5 (*)    All other components within normal limits  DIFFERENTIAL - Abnormal; Notable for the following:    Neutro Abs 21.8 (*)    Monocytes Absolute 1.6 (*)    All other components within normal limits  BILIRUBIN, DIRECT    Radiology Ct Abdomen Pelvis W Contrast  Result Date: 02/22/2017 CLINICAL DATA:  56 year old male with abdominal pain and vomiting for 4 days. Elevated lipase. Suspect pancreatitis. Prior appendectomy and back surgery (as on prior CT report). Initial encounter. EXAM: CT ABDOMEN AND PELVIS WITH CONTRAST TECHNIQUE: Multidetector CT imaging of the abdomen and pelvis was performed using the standard protocol following bolus administration of intravenous contrast. CONTRAST:  ISOVUE-300 IOPAMIDOL (ISOVUE-300) INJECTION 61% COMPARISON:  01/01/2016 chest CT. 04/19/2005 abdominal/pelvic CT. Lumbar spine plain film examination 12/07/2010. FINDINGS: Lower chest: Basilar atelectasis/scarring.  Heart top-normal size. Hepatobiliary: No worrisome hepatic lesion. Focal fatty infiltration adjacent to the fissure for the falciform ligament. No calcified gallstone or common bile duct stone. Pancreas: Diffuse inflammation most prominent pancreatic head level. Slightly heterogeneous pancreatic head/ uncinate process without discrete mass identified. Pancreas can be evaluated after pancreatitis is cleared. Spleen: No mass or enlargement. Adrenals/Urinary Tract: Adrenal glands unremarkable. Right renal cysts measuring up to 6 cm. Scarred kidneys greater on the right. No obstructing stone or hydronephrosis. Extra renal pelvis on the right. Noncontrast filled views  of the urinary bladder unremarkable. Stomach/Bowel: Diffuse inflammation surrounds the duodenum which is most likely secondary to the pancreatitis rather than primary duodenal abnormality. Small hiatal hernia. Circumferential thickening distal esophagus may be related to under distension/reflux. Limited for evaluating for mass secondary to under distension. Vascular/Lymphatic: Mild atherosclerotic changes aorta, iliac artery is an right femoral artery. No abdominal aortic aneurysm or large vessel occlusion. Portal vein, splenic vein and superior mesenteric vein are patent. Small upper abdominal lymph nodes possibly reactive in origin. Reproductive: No worrisome abnormality. Other: Free fluid felt to be most consistent with fluid secondary to pancreatitis extends throughout the abdomen and upper pelvis Musculoskeletal: Extensive surgery with fusion T12 through S1 and corpectomy L4 level. Remote T10 compression fracture with anterior wedge configuration with 90% loss height anteriorly with kyphosis centered at this level with the cord draped over this region. Prominent T11-12 degenerative changes. IMPRESSION: Findings most consistent with pancreatitis. No evidence of  calcified obstructing common bile duct stone or pancreatic mass. Evaluation for pancreatic lesion limited secondary to the degree of inflammation. Fluid throughout portions of the abdomen and pelvis felt most likely related to the pancreatitis. Secondary moderate inflammation of the duodenum. No venous thrombosis noted. Aortic Atherosclerosis (ICD10-I70.0). Right renal cysts largest 6 cm. Renal scarring greater on the right. No hydronephrosis. Small hiatal hernia. Circumferential thickening distal esophagus may be related to under distension/reflux. Limited for evaluating for mass secondary to under distension. Extensive surgery with fusion T12 through S1 and corpectomy L4 level. Remote T10 compression fracture with anterior wedge configuration with 90%  loss height anteriorly with kyphosis centered at this level with the cord draped over this region. Prominent T11-12 degenerative changes. Electronically Signed   By: Lacy Duverney M.D.   On: 02/22/2017 07:18    Procedures Procedures (including critical care time)  Medications Ordered in ED Medications  morphine 4 MG/ML injection 4 mg (4 mg Intravenous Given 02/22/17 0539)  ondansetron (ZOFRAN) injection 4 mg (4 mg Intravenous Given 02/22/17 0539)  sodium chloride 0.9 % bolus 1,000 mL (1,000 mLs Intravenous New Bag/Given 02/22/17 0539)  iopamidol (ISOVUE-300) 61 % injection 100 mL (100 mLs Intravenous Contrast Given 02/22/17 4098)     Initial Impression / Assessment and Plan / ED Course  I have reviewed the triage vital signs and the nursing notes.  Pertinent labs & imaging results that were available during my care of the patient were reviewed by me and considered in my medical decision making (see chart for details).  Abdominal pain with vomiting. Laboratory workup had been ordered prior to my seeing the patient, and is significant for marked leukocytosis with WBC 25.2, elevated lipase at 212, which is 4 times upper limit of normal. Also, moderate elevation of AST and ALT and bilirubin. Patient denies history of ethanol abuse, and there is no report of lipemic serum. Cause of pancreatitis is unclear. With elevated transaminases and bilirubin, I am worried about possible gallstone pancreatitis. He will be sent for CT of abdomen and pelvis. Ultrasound will need to be obtained later today. In the meantime, he is given IV fluids, ondansetron, morphine. Old records are reviewed, and he has no relevant past visits. Last CT of abdomen and pelvis was in 2005, and was unremarkable.  I have evaluated his CT of abdomen and pelvis, and there appears to be some edema of the head of the pancreas, but I do not see any biliary or hepatic pathology. Final report is pending. Case is discussed with Dr. Izola Price of  [facility], who agrees to admit the patient.  Final Clinical Impressions(s) / ED Diagnoses   Final diagnoses:  Acute pancreatitis without infection or necrosis, unspecified pancreatitis type  Elevated transaminase level  Total bilirubin, elevated    New Prescriptions New Prescriptions   No medications on file     Dione Booze, MD 02/22/17 [id number]

## 2017-02-22 NOTE — Progress Notes (Signed)
Pharmacy Antibiotic Note  Craig [name] is a 56 y.o. male admitted on 02/22/2017 with intra-abdominal infection.  Pharmacy has been consulted for ZOSYN dosing.  Plan: Zosyn 3.375gm IV q8h, EID Monitor labs, progress, c/s  Height:  (167.6 cm) Weight: 210 lb 15.7 oz (95.7 kg) IBW/kg (Calculated) : 63.8  Temp (24hrs), Avg:98.5 F (36.9 C), Min:97.8 F (36.6 C), Max:99.6 F (37.6 C)   Recent Labs Lab 02/22/17 0205  WBC 25.2*  CREATININE 0.74    Estimated Creatinine Clearance: 113 mL/min (by C-G formula based on SCr of 0.74 mg/dL).   No Known Allergies  Antimicrobials this admission: Zosyn 9/18 >>   Microbiology results:  MRSA PCR: pending  Thank you for allowing pharmacy to be a part of this patient's care.  Valrie Hart A 02/22/2017 1:09 PM

## 2017-02-22 NOTE — Consult Note (Signed)
Referring Provider: Dr. Danie Binder  Primary Care Physician:  Oval Linsey, MD Primary Gastroenterologist:  Dr. Darrick Penna   Date of Admission: 02/22/17 Date of Consultation: 02/22/17  Reason for Consultation:  Pancreatitis   HPI:  Craig [name] is a 56 y.o. year old male presenting with worsening abdominal pain for the past several days, associated nausea and vomiting, and found to have pancreatitis on CT, elevated transaminases, and lipase 212. His WBC count was 25.2 on admission and started on Zosyn.   States abdominal pain was scaring him, thinking it may be a heart attack. Poor historian. Pain in upper back and "all across abdomen". States he has had this pain off and on for years. This episode started about 3 days ago, worsening in severity. Associated N/V. Denies any ETOH use currently. Quit drinking alcohol "years ago". History of drug use (marijuana) about 2 years ago. No IV drug use historically. Fever last few days per patient. Tmax on admission 99.6. Afebrile currently.  Pain improved since admission but "still bad".   No prior colonoscopy or EGD. Brother diagnosed with colon cancer less than age 36 and succumbed to disease. Chronic GERD. CT also noted circumferential thickening distal esophagus that could be secondary to underdistension vs reflux.     Past Medical History:  Diagnosis Date  . Bipolar 1 disorder (HCC)   . COPD (chronic obstructive pulmonary disease) (HCC)   . PTSD (post-traumatic stress disorder)   . Stroke [facility])    left-sided weakness, around 2008    Past Surgical History:  Procedure Laterality Date  . APPENDECTOMY    . BACK SURGERY      Prior to Admission medications   Medication Sig Start Date End Date Taking? Authorizing Provider  albuterol (PROVENTIL HFA;VENTOLIN HFA) 108 (90 Base) MCG/ACT inhaler Inhale 2 puffs into the lungs every 6 (six) hours as needed for wheezing or shortness of breath. 01/06/16  Yes Oval Linsey, MD  gabapentin  (NEURONTIN) 300 MG capsule One tablet bid Patient taking differently: Take 300 mg by mouth 2 (two) times daily. One tablet bid 02/09/13  Yes Bethann Berkshire, MD  ipratropium-albuterol (DUONEB) 0.5-2.5 (3) MG/3ML SOLN Take 3 mLs by nebulization every 4 (four) hours as needed. 01/06/16  Yes Dondiego, Gerlene Burdock, MD  lithium carbonate 300 MG capsule Take 300 mg by mouth 2 (two) times daily with a meal.   Yes [provider]    Current Facility-Administered Medications  Medication Dose Route Frequency Provider Last Rate Last Dose  . 0.9 %  sodium chloride infusion   Intravenous Continuous Dorothea Ogle, MD 125 mL/hr at 02/22/17 1154    . enoxaparin (LOVENOX) injection 40 mg  40 mg Subcutaneous Q24H Dorothea Ogle, MD   40 mg at 02/22/17 1155  . [START ON 02/23/2017] Influenza vac split quadrivalent PF (FLUARIX) injection 0.5 mL  0.5 mL Intramuscular Tomorrow-1000 Dorothea Ogle, MD      . ipratropium-albuterol (DUONEB) 0.5-2.5 (3) MG/3ML nebulizer solution 3 mL  3 mL Nebulization Q4H PRN Dorothea Ogle, MD      . lithium carbonate capsule 300 mg  300 mg Oral BID WC Dorothea Ogle, MD   300 mg at 02/22/17 1702  . morphine 2 MG/ML injection 1-2 mg  1-2 mg Intravenous Q4H PRN Dorothea Ogle, MD   2 mg at 02/22/17 1410  . ondansetron (ZOFRAN) injection 4 mg  4 mg Intravenous Q6H PRN Dorothea Ogle, MD      . piperacillin-tazobactam [facility])  IVPB 3.375 g  3.375 g Intravenous Q8H Dorothea Ogle, MD 12.5 mL/hr at 02/22/17 1410 3.375 g at 02/22/17 1410  . traMADol (ULTRAM) tablet 50 mg  50 mg Oral Q6H PRN Dorothea Ogle, MD   50 mg at 02/22/17 1233    Allergies as of 02/22/2017  . (No Known Allergies)    Family History  Problem Relation Age of Onset  . Colon cancer Brother        diagnosed around age 109   . Pancreatic cancer Neg Hx   . Pancreatic disease Neg Hx     Social History   Social History  . Marital status: Widowed    Spouse name: N/A  . Number of children: N/A  . Years of education:  N/A   Occupational History  . Not on file.   Social History Main Topics  . Smoking status: Former Smoker    Types: Cigarettes  . Smokeless tobacco: Never Used  . Alcohol use No     Comment: occ  . Drug use: No  . Sexual activity: Yes   Other Topics Concern  . Not on file   Social History Narrative  . No narrative on file    Review of Systems: Gen: see HPI  CV: Denies chest pain, heart palpitations, syncope, edema  Resp:  GI: see HPI  GU : Denies urinary burning, urinary frequency, urinary incontinence.  MS: Denies joint pain,swelling, cramping Derm: Denies rash, itching, dry skin Psych: Denies depression, anxiety,confusion, or memory loss Heme: Denies bruising, bleeding, and enlarged lymph nodes.  Physical Exam: Vital signs in last 24 hours: Temp:  [97.8 F (36.6 C)-99.6 F (37.6 C)] 97.8 F (36.6 C) (09/18 1042) Pulse Rate:  [100-116] 112 (09/18 1042) Resp:  [17-18] 18 (09/18 1042) BP: (103-131)/(69-87) 131/77 (09/18 1042) SpO2:  [91 %-98 %] 96 % (09/18 1042) Weight:  [210 lb 15.7 oz (95.7 kg)-211 lb (95.7 kg)] 210 lb 15.7 oz (95.7 kg) (09/18 1042) Last BM Date: 02/20/17 General:   Alert,  Appears uncomfortable Head:  Normocephalic and atraumatic. Eyes:  Sclera clear, no icterus.   Ears:  Normal auditory acuity. Nose:  No deformity, discharge,  or lesions. Mouth:  No deformity or lesions Lungs: coarse, diminished bases   Heart:  S1 S2 present, mild tachycardia  Abdomen:  Soft, hypoactive bowel sounds, TTP mainly epigastric region and nondistended. Midline incision with umbilicus to right of incision  Rectal:  Deferred until time of colonoscopy.   Msk:  Symmetrical without gross deformities. Normal posture. Extremities:  Without  edema. Neurologic:  Alert and  oriented x4 Psych:  Alert and cooperative. Normal mood and affect.  Intake/Output from previous day: No intake/output data recorded. Intake/Output this shift: Total I/O In: -  Out: 300  [Urine:300]  Lab Results:  Recent Labs  02/22/17 0205  WBC 25.2*  HGB 15.2  HCT 44.8  PLT 247   BMET  Recent Labs  02/22/17 0205  NA 133*  K 3.6  CL 99*  CO2 23  GLUCOSE 116*  BUN 23*  CREATININE 0.74  CALCIUM 8.1*   LFT  Recent Labs  02/22/17 0205  PROT 7.3  ALBUMIN 3.9  AST 127*  ALT 253*  ALKPHOS 115  BILITOT 1.7*  BILIDIR 0.4   Lab Results  Component Value Date   LIPASE 212 (H) 02/22/2017    Studies/Results: Ct Abdomen Pelvis W Contrast  Result Date: 02/22/2017 CLINICAL DATA:  56 year old male with abdominal pain and vomiting for 4 days. Elevated  lipase. Suspect pancreatitis. Prior appendectomy and back surgery (as on prior CT report). Initial encounter. EXAM: CT ABDOMEN AND PELVIS WITH CONTRAST TECHNIQUE: Multidetector CT imaging of the abdomen and pelvis was performed using the standard protocol following bolus administration of intravenous contrast. CONTRAST:  ISOVUE-300 IOPAMIDOL (ISOVUE-300) INJECTION 61% COMPARISON:  01/01/2016 chest CT. 04/19/2005 abdominal/pelvic CT. Lumbar spine plain film examination 12/07/2010. FINDINGS: Lower chest: Basilar atelectasis/scarring.  Heart top-normal size. Hepatobiliary: No worrisome hepatic lesion. Focal fatty infiltration adjacent to the fissure for the falciform ligament. No calcified gallstone or common bile duct stone. Pancreas: Diffuse inflammation most prominent pancreatic head level. Slightly heterogeneous pancreatic head/ uncinate process without discrete mass identified. Pancreas can be evaluated after pancreatitis is cleared. Spleen: No mass or enlargement. Adrenals/Urinary Tract: Adrenal glands unremarkable. Right renal cysts measuring up to 6 cm. Scarred kidneys greater on the right. No obstructing stone or hydronephrosis. Extra renal pelvis on the right. Noncontrast filled views of the urinary bladder unremarkable. Stomach/Bowel: Diffuse inflammation surrounds the duodenum which is most likely secondary  to the pancreatitis rather than primary duodenal abnormality. Small hiatal hernia. Circumferential thickening distal esophagus may be related to under distension/reflux. Limited for evaluating for mass secondary to under distension. Vascular/Lymphatic: Mild atherosclerotic changes aorta, iliac artery is an right femoral artery. No abdominal aortic aneurysm or large vessel occlusion. Portal vein, splenic vein and superior mesenteric vein are patent. Small upper abdominal lymph nodes possibly reactive in origin. Reproductive: No worrisome abnormality. Other: Free fluid felt to be most consistent with fluid secondary to pancreatitis extends throughout the abdomen and upper pelvis Musculoskeletal: Extensive surgery with fusion T12 through S1 and corpectomy L4 level. Remote T10 compression fracture with anterior wedge configuration with 90% loss height anteriorly with kyphosis centered at this level with the cord draped over this region. Prominent T11-12 degenerative changes. IMPRESSION: Findings most consistent with pancreatitis. No evidence of calcified obstructing common bile duct stone or pancreatic mass. Evaluation for pancreatic lesion limited secondary to the degree of inflammation. Fluid throughout portions of the abdomen and pelvis felt most likely related to the pancreatitis. Secondary moderate inflammation of the duodenum. No venous thrombosis noted. Aortic Atherosclerosis (ICD10-I70.0). Right renal cysts largest 6 cm. Renal scarring greater on the right. No hydronephrosis. Small hiatal hernia. Circumferential thickening distal esophagus may be related to under distension/reflux. Limited for evaluating for mass secondary to under distension. Extensive surgery with fusion T12 through S1 and corpectomy L4 level. Remote T10 compression fracture with anterior wedge configuration with 90% loss height anteriorly with kyphosis centered at this level with the cord draped over this region. Prominent T11-12 degenerative  changes. Electronically Signed   By: Lacy Duverney M.D.   On: 02/22/2017 07:18    Impression: 56 year old male admitted with acute pancreatitis, elevated transaminases, unclear etiology. Reviewed medications and no obvious offenders taking as outpatient. CT without gallstones or biliary dilatation, with ultrasound being more sensitive for gallstones. Leukocytosis noted on admission and is afebrile currently, empirically started on Zosyn. Will follow LFTs and may need additional imaging while inpatient if no improvement. Would favor EUS likely as outpatient for further evaluation once acute illness resolved. With history of COPD, will not increase IVFs and keep at 125 ml/hour.   As separate issue: brother with history of colon cancer < 65 years of age, succumbing to disease. No prior colonoscopy. Esophageal thickening on CT could be due to underdistension vs reflux, but even if related to underdistension, would favor EGD as outpatient due to chronic GERD.  Plan: NPO Repeat LFTs, lipase in am Repeat CBC Supportive measures Keep IVFs at current rate Will continue to follow with you  Gelene Mink, PhD, ANP-BC [facility]     LOS: 0 days    02/22/2017, 5:15 PM

## 2017-02-22 NOTE — ED Triage Notes (Signed)
[facility]/o abdominal pain and vomiting x 4 days; pt's cbg with ems 120; pt states he feels weak and has trouble walking which is a new problem

## 2017-02-23 ENCOUNTER — Inpatient Hospital Stay (HOSPITAL_COMMUNITY): Payer: Medicare Other

## 2017-02-23 DIAGNOSIS — R748 Abnormal levels of other serum enzymes: Secondary | ICD-10-CM | POA: Diagnosis present

## 2017-02-23 LAB — COMPREHENSIVE METABOLIC PANEL
ALBUMIN: 3.4 g/dL — AB (ref 3.5–5.0)
ALT: 117 U/L — ABNORMAL HIGH (ref 17–63)
AST: 52 U/L — AB (ref 15–41)
Alkaline Phosphatase: 86 U/L (ref 38–126)
Anion gap: 9 (ref 5–15)
BILIRUBIN TOTAL: 1.5 mg/dL — AB (ref 0.3–1.2)
BUN: 19 mg/dL (ref 6–20)
CO2: 22 mmol/L (ref 22–32)
Calcium: 7.7 mg/dL — ABNORMAL LOW (ref 8.9–10.3)
Chloride: 102 mmol/L (ref 101–111)
Creatinine, Ser: 0.7 mg/dL (ref 0.61–1.24)
GFR calc Af Amer: >60 mL/min (ref >60)
GFR calc non Af Amer: >60 mL/min (ref >60)
GLUCOSE: 93 mg/dL (ref 65–99)
POTASSIUM: 3.7 mmol/L (ref 3.5–5.1)
Sodium: 133 mmol/L — ABNORMAL LOW (ref 135–145)
TOTAL PROTEIN: 6.6 g/dL (ref 6.5–8.1)

## 2017-02-23 LAB — CBC
HEMATOCRIT: 39.1 % (ref 39.0–52.0)
Hemoglobin: 12.9 g/dL — ABNORMAL LOW (ref 13.0–17.0)
MCH: 29.9 pg (ref 26.0–34.0)
MCHC: 33 g/dL (ref 30.0–36.0)
MCV: 90.5 fL (ref 78.0–100.0)
Platelets: 257 10*3/uL (ref 150–400)
RBC: 4.32 MIL/uL (ref 4.22–5.81)
RDW: 13 % (ref 11.5–15.5)
WBC: 26.7 10*3/uL — ABNORMAL HIGH (ref 4.0–10.5)

## 2017-02-23 LAB — LIPID PANEL
CHOLESTEROL: 115 mg/dL (ref 0–200)
HDL: 34 mg/dL — ABNORMAL LOW (ref >40)
LDL CALC: 67 mg/dL (ref 0–99)
Total CHOL/HDL Ratio: 3.4 RATIO
Triglycerides: 69 mg/dL (ref <150)
VLDL: 14 mg/dL (ref 0–40)

## 2017-02-23 LAB — HIV ANTIBODY (ROUTINE TESTING W REFLEX): HIV SCREEN 4TH GENERATION: NONREACTIVE

## 2017-02-23 LAB — HEPATITIS PANEL, ACUTE
HCV Ab: <0.1 s/co ratio (ref 0.0–0.9)
HEP A IGM: NEGATIVE
Hep B C IgM: NEGATIVE
Hepatitis B Surface Ag: NEGATIVE

## 2017-02-23 LAB — LIPASE, BLOOD: Lipase: 61 U/L — ABNORMAL HIGH (ref 11–51)

## 2017-02-23 NOTE — Progress Notes (Signed)
[facility]  Full Consult to Follow tomorrow AM.  Reviewed patient's chart. Will see in AM. Agree with gallstone pancreatitis. Looking at CT scan left lateral abdominal wall appears to have some laxity, surgical history in chart not indicating surgery on this side.  Will discuss with patient tomorrow.   Can plan for inpatient cholecystectomy once patient's abdominal pain greatly improved.  Will assess tomorrow for possibly OR Friday at earliest.   Algis Greenhouse, MD [facility] [address] [place], [place] [address] [phone] (office)

## 2017-02-23 NOTE — Progress Notes (Addendum)
Subjective:  Pain and nausea persistent but improved.   Objective: Vital signs in last 24 hours: Temp:  [97.8 F (36.6 C)-98.8 F (37.1 C)] 98.8 F (37.1 C) (09/19 0538) Pulse Rate:  [100-113] 105 (09/19 0538) Resp:  [17-20] 20 (09/19 0538) BP: (103-131)/(69-77) 130/73 (09/19 0538) SpO2:  [92 %-96 %] 95 % (09/19 0538) Weight:  [210 lb 15.7 oz (95.7 kg)] 210 lb 15.7 oz (95.7 kg) (09/18 1042) Last BM Date: 02/20/17 General:   Alert,  Well-developed, well-nourished, pleasant and cooperative in NAD but appears uncomfortable.  Head:  Normocephalic and atraumatic. Eyes:  Sclera clear, no icterus.  Abdomen:  Soft, moderate epigastric tenderness, nondistended. Normal bowel sounds, without guarding, and without rebound.   Extremities:  Without clubbing, deformity or edema. Neurologic:  Alert and  oriented x4;  grossly normal neurologically. Skin:  Intact without significant lesions or rashes. Psych:  Alert and cooperative. Normal mood and affect.  Intake/Output from previous day: 09/18 0701 - 09/19 0700 In: 1577.1 [I.V.:1477.1; IV Piggyback:100] Out: 400 [Urine:400] Intake/Output this shift: No intake/output data recorded.  Lab Results: CBC  Recent Labs  02/22/17 0205 02/23/17 0615  WBC 25.2* 26.7*  HGB 15.2 12.9*  HCT 44.8 39.1  MCV 88.7 90.5  PLT 247 257   BMET  Recent Labs  02/22/17 0205 02/23/17 0615  NA 133* 133*  K 3.6 3.7  CL 99* 102  CO2 23 22  GLUCOSE 116* 93  BUN 23* 19  CREATININE 0.74 0.70  CALCIUM 8.1* 7.7*   LFTs  Recent Labs  02/22/17 0205 02/23/17 0615  BILITOT 1.7* 1.5*  BILIDIR 0.4  --   ALKPHOS 115 86  AST 127* 52*  ALT 253* 117*  PROT 7.3 6.6  ALBUMIN 3.9 3.4*    Recent Labs  02/22/17 0205 02/23/17 0615  LIPASE 212* 61*   PT/INR No results for input(s): LABPROT, INR in the last 72 hours.    Imaging Studies: Ct Abdomen Pelvis W Contrast  Result Date: 02/22/2017 CLINICAL DATA:  56 year old male with abdominal pain and  vomiting for 4 days. Elevated lipase. Suspect pancreatitis. Prior appendectomy and back surgery (as on prior CT report). Initial encounter. EXAM: CT ABDOMEN AND PELVIS WITH CONTRAST TECHNIQUE: Multidetector CT imaging of the abdomen and pelvis was performed using the standard protocol following bolus administration of intravenous contrast. CONTRAST:  138m ISOVUE-300 IOPAMIDOL (ISOVUE-300) INJECTION 61% COMPARISON:  01/01/2016 chest CT. 04/19/2005 abdominal/pelvic CT. Lumbar spine plain film examination 12/07/2010. FINDINGS: Lower chest: Basilar atelectasis/scarring.  Heart top-normal size. Hepatobiliary: No worrisome hepatic lesion. Focal fatty infiltration adjacent to the fissure for the falciform ligament. No calcified gallstone or common bile duct stone. Pancreas: Diffuse inflammation most prominent pancreatic head level. Slightly heterogeneous pancreatic head/ uncinate process without discrete mass identified. Pancreas can be evaluated after pancreatitis is cleared. Spleen: No mass or enlargement. Adrenals/Urinary Tract: Adrenal glands unremarkable. Right renal cysts measuring up to 6 cm. Scarred kidneys greater on the right. No obstructing stone or hydronephrosis. Extra renal pelvis on the right. Noncontrast filled views of the urinary bladder unremarkable. Stomach/Bowel: Diffuse inflammation surrounds the duodenum which is most likely secondary to the pancreatitis rather than primary duodenal abnormality. Small hiatal hernia. Circumferential thickening distal esophagus may be related to under distension/reflux. Limited for evaluating for mass secondary to under distension. Vascular/Lymphatic: Mild atherosclerotic changes aorta, iliac artery is an right femoral artery. No abdominal aortic aneurysm or large vessel occlusion. Portal vein, splenic vein and superior mesenteric vein are patent. Small upper abdominal lymph nodes  possibly reactive in origin. Reproductive: No worrisome abnormality. Other: Free fluid  felt to be most consistent with fluid secondary to pancreatitis extends throughout the abdomen and upper pelvis Musculoskeletal: Extensive surgery with fusion T12 through S1 and corpectomy L4 level. Remote T10 compression fracture with anterior wedge configuration with 90% loss height anteriorly with kyphosis centered at this level with the cord draped over this region. Prominent T11-12 degenerative changes. IMPRESSION: Findings most consistent with pancreatitis. No evidence of calcified obstructing common bile duct stone or pancreatic mass. Evaluation for pancreatic lesion limited secondary to the degree of inflammation. Fluid throughout portions of the abdomen and pelvis felt most likely related to the pancreatitis. Secondary moderate inflammation of the duodenum. No venous thrombosis noted. Aortic Atherosclerosis (ICD10-I70.0). Right renal cysts largest 6 cm. Renal scarring greater on the right. No hydronephrosis. Small hiatal hernia. Circumferential thickening distal esophagus may be related to under distension/reflux. Limited for evaluating for mass secondary to under distension. Extensive surgery with fusion T12 through S1 and corpectomy L4 level. Remote T10 compression fracture with anterior wedge configuration with 90% loss height anteriorly with kyphosis centered at this level with the cord draped over this region. Prominent T11-12 degenerative changes. Electronically Signed   By: Genia Del M.D.   On: 02/22/2017 07:18  [2 weeks]   Assessment: 56 year old male admitted with acute pancreatitis, elevated transaminases and total bili (normal alkphos), unclear etiology. ?biliary pancreatitis. Reviewed medications and no obvious offenders taking as outpatient. Lipid panel pending. CT without gallstones or biliary dilatation. U/S this morning confirmed cholelithiasis, no acute cholecystitis but dilated CBD to 8.3 mm without obvious CBD stone. Leukocytosis noted on admission and is afebrile currently,  empirically started on Zosyn.  LFTs improved.   As separate issue: brother with history of colon cancer < 34 years of age, succumbing to disease. No prior colonoscopy. Esophageal thickening on CT could be due to underdistension vs reflux, but even if related to underdistension, would favor EGD as outpatient due to chronic GERD.   Plan: 1. Labs tomorrow.  2. Consider surgical consultation for cholecystectomy.  3. EGD/TCS with MAC as outpatient. 4. Continue supportive measures.   Laureen Ochs. Bernarda Caffey [facility] [phone] 9/19/20189:59 AM     LOS: 1 day    Patient seen and examined. Agree with above assessment and recommendations as outlined.

## 2017-02-23 NOTE — Progress Notes (Signed)
Craig [name]:[id number] DOB: 1960/06/26 DOA: 02/22/2017 PCP: Oval Linsey, MD   Physical Exam: Blood pressure 130/73, pulse (!) 105, temperature 98.8 F (37.1 C), temperature source Oral, resp. rate 20, height  (1.676 m), weight 95.7 kg (210 lb 15.7 oz), SpO2 95 %. Lungs show prolonged expiratory phase scattered rhonchi mild end expiratory wheeze noted no rales audible heart regular rhythm no S3 or S4 no heaves thrills rubs   Investigations:  Recent Results (from the past 240 hour(s))  MRSA PCR Screening     Status: None   Collection Time: 02/22/17 10:10 AM  Result Value Ref Range Status   MRSA by PCR NEGATIVE NEGATIVE Final    Comment:        The GeneXpert MRSA Assay (FDA approved for NASAL specimens only), is one component of a comprehensive MRSA colonization surveillance program. It is not intended to diagnose MRSA infection nor to guide or monitor treatment for MRSA infections.   Culture, blood (routine x 2)     Status: None (Preliminary result)   Collection Time: 02/22/17  2:16 PM  Result Value Ref Range Status   Specimen Description BLOOD LEFT HAND  Final   Special Requests   Final    BOTTLES DRAWN AEROBIC AND ANAEROBIC Blood Culture adequate volume   Culture NO GROWTH < 24 HOURS  Final   Report Status PENDING  Incomplete  Culture, blood (routine x 2)     Status: None (Preliminary result)   Collection Time: 02/22/17  2:17 PM  Result Value Ref Range Status   Specimen Description RIGHT ANTECUBITAL  Final   Special Requests   Final    BOTTLES DRAWN AEROBIC AND ANAEROBIC Blood Culture adequate volume   Culture NO GROWTH < 24 HOURS  Final   Report Status PENDING  Incomplete     Basic Metabolic Panel:  Recent Labs  81/19/14 0205 02/23/17 0615  NA 133* 133*  K 3.6 3.7  CL 99* 102  CO2 23 22  GLUCOSE 116* 93  BUN 23* 19  CREATININE 0.74 0.70  CALCIUM 8.1* 7.7*   Liver Function Tests:  Recent Labs  02/22/17 0205 02/23/17 0615  AST 127*  52*  ALT 253* 117*  ALKPHOS 115 86  BILITOT 1.7* 1.5*  PROT 7.3 6.6  ALBUMIN 3.9 3.4*     CBC:  Recent Labs  02/22/17 0205 02/23/17 0615  WBC 25.2* 26.7*  NEUTROABS 21.8*  --   HGB 15.2 12.9*  HCT 44.8 39.1  MCV 88.7 90.5  PLT 247 257    Ct Abdomen Pelvis W Contrast  Result Date: 02/22/2017 CLINICAL DATA:  56 year old male with abdominal pain and vomiting for 4 days. Elevated lipase. Suspect pancreatitis. Prior appendectomy and back surgery (as on prior CT report). Initial encounter. EXAM: CT ABDOMEN AND PELVIS WITH CONTRAST TECHNIQUE: Multidetector CT imaging of the abdomen and pelvis was performed using the standard protocol following bolus administration of intravenous contrast. CONTRAST:  ISOVUE-300 IOPAMIDOL (ISOVUE-300) INJECTION 61% COMPARISON:  01/01/2016 chest CT. 04/19/2005 abdominal/pelvic CT. Lumbar spine plain film examination 12/07/2010. FINDINGS: Lower chest: Basilar atelectasis/scarring.  Heart top-normal size. Hepatobiliary: No worrisome hepatic lesion. Focal fatty infiltration adjacent to the fissure for the falciform ligament. No calcified gallstone or common bile duct stone. Pancreas: Diffuse inflammation most prominent pancreatic head level. Slightly heterogeneous pancreatic head/ uncinate process without discrete mass identified. Pancreas can be evaluated after pancreatitis is cleared. Spleen: No mass or enlargement. Adrenals/Urinary Tract: Adrenal glands unremarkable. Right renal cysts measuring up to  6 cm. Scarred kidneys greater on the right. No obstructing stone or hydronephrosis. Extra renal pelvis on the right. Noncontrast filled views of the urinary bladder unremarkable. Stomach/Bowel: Diffuse inflammation surrounds the duodenum which is most likely secondary to the pancreatitis rather than primary duodenal abnormality. Small hiatal hernia. Circumferential thickening distal esophagus may be related to under distension/reflux. Limited for evaluating for mass  secondary to under distension. Vascular/Lymphatic: Mild atherosclerotic changes aorta, iliac artery is an right femoral artery. No abdominal aortic aneurysm or large vessel occlusion. Portal vein, splenic vein and superior mesenteric vein are patent. Small upper abdominal lymph nodes possibly reactive in origin. Reproductive: No worrisome abnormality. Other: Free fluid felt to be most consistent with fluid secondary to pancreatitis extends throughout the abdomen and upper pelvis Musculoskeletal: Extensive surgery with fusion T12 through S1 and corpectomy L4 level. Remote T10 compression fracture with anterior wedge configuration with 90% loss height anteriorly with kyphosis centered at this level with the cord draped over this region. Prominent T11-12 degenerative changes. IMPRESSION: Findings most consistent with pancreatitis. No evidence of calcified obstructing common bile duct stone or pancreatic mass. Evaluation for pancreatic lesion limited secondary to the degree of inflammation. Fluid throughout portions of the abdomen and pelvis felt most likely related to the pancreatitis. Secondary moderate inflammation of the duodenum. No venous thrombosis noted. Aortic Atherosclerosis (ICD10-I70.0). Right renal cysts largest 6 cm. Renal scarring greater on the right. No hydronephrosis. Small hiatal hernia. Circumferential thickening distal esophagus may be related to under distension/reflux. Limited for evaluating for mass secondary to under distension. Extensive surgery with fusion T12 through S1 and corpectomy L4 level. Remote T10 compression fracture with anterior wedge configuration with 90% loss height anteriorly with kyphosis centered at this level with the cord draped over this region. Prominent T11-12 degenerative changes. Electronically Signed   By: Lacy Duverney M.D.   On: 02/22/2017 07:18   US Abdomen Limited  Result Date: 02/23/2017 CLINICAL DATA:  Evaluate for gallstones. Increased liver function tests.  Abdominal pain and vomiting. Elevated lipase. EXAM: ULTRASOUND ABDOMEN LIMITED RIGHT UPPER QUADRANT COMPARISON:  CT abdomen pelvis 02/22/2017. FINDINGS: Gallbladder: Gallbladder calculi are present with layering sludge. At least one stone measures 10 mm in longest dimension. Gallbladder wall thickness 3 mm. Negative sonographic Murphy's sign. Common bile duct: Diameter: Dilated, up to 8.3 mm. No common duct stones could be identified. Liver: Increased echogenicity consistent with steatosis. Portal vein is patent on color Doppler imaging with normal direction of blood flow towards the liver. IMPRESSION: Cholelithiasis and gallbladder sludge. Extrahepatic biliary ductal dilatation, common bile duct diameter greater than 8 mm. Electronically Signed   By: Elsie Stain M.D.   On: 02/23/2017 09:32      Medications:   Impression:  Active Problems:   Acute pancreatitis   Elevated liver enzymes     Plan: Surgical consult to see if cholecystectomy is advisable for prevention of possible recurrence. Monitor lipase and liver function tests daily 3 days   Consultants: Gastroenterology and Gen. surgery requested   Procedures   Antibiotics:           Time spent: 30 minutes   LOS: 1 day   Ole Lafon M   02/23/2017, 1:28 PM

## 2017-02-23 NOTE — Consult Note (Signed)
[facility]  Reason for Consult: Gallstone pancreatitis  Referring Physician:  Dr. Cindie Laroche Dr. Valentina [name] is an 56 y.o. male.  HPI: Craig [name] is a gentleman with COPD, PTSD, bipolar, chronic back pain on methadone previously per his report who presented to the ED with abdominal pain that radiated to his back. The pain has been constant and has improved slightly since is admission. He denies any nausea or vomiting but does state he is fatigued.  He denies any alcohol use as he is a prisoner and was found to have gallstones on his ultrasound. He was evaluated by the GI physicians who agree with likely diagnosis of gallstone pancreatitis and has been receiving zosyn for a WBC but has no evidence of cholecystitis on his ultrasound.    Past Medical History:  Diagnosis Date  . Bipolar 1 disorder ([place])   . COPD (chronic obstructive pulmonary disease) ([place])   . PTSD (post-traumatic stress disorder)   . Stroke [facility])    left-sided weakness, around 2008    Past Surgical History:  Procedure Laterality Date  . APPENDECTOMY    . BACK SURGERY      Family History  Problem Relation Age of Onset  . Colon cancer Brother        diagnosed around age 29   . Pancreatic cancer Neg Hx   . Pancreatic disease Neg Hx     Social History:  reports that he has quit smoking. His smoking use included Cigarettes. He has never used smokeless tobacco. He reports that he does not drink alcohol or use drugs.  Allergies: No Known Allergies  Medications:  Scheduled: . enoxaparin (LOVENOX) injection  40 mg Subcutaneous Q24H  .  HYDROmorphone (DILAUDID) injection  0.5 mg Intravenous Q6H  . lithium carbonate  300 mg Oral BID WC  . ondansetron (ZOFRAN) IV  4 mg Intravenous Q6H   Continuous: . sodium chloride 125 mL/hr at 02/22/17 1802  . piperacillin-tazobactam (ZOSYN)  IV Stopped (02/23/17 1614)   OZH:YQMVHQIONGE-[id number], morphine injection, ondansetron (ZOFRAN) IV,  oxyCODONE, traMADol  Results for orders placed or performed during the hospital encounter of 02/22/17 (from the past 48 hour(s))  Lipase, blood     Status: Abnormal   Collection Time: 02/22/17  2:05 AM  Result Value Ref Range   Lipase 212 (H) 11 - 51 U/L  Comprehensive metabolic panel     Status: Abnormal   Collection Time: 02/22/17  2:05 AM  Result Value Ref Range   Sodium 133 (L) 135 - 145 mmol/L   Potassium 3.6 3.5 - 5.1 mmol/L   Chloride 99 (L) 101 - 111 mmol/L   CO2 23 22 - 32 mmol/L   Glucose, Bld 116 (H) 65 - 99 mg/dL   BUN 23 (H) 6 - 20 mg/dL   Creatinine, Ser 0.74 0.61 - 1.24 mg/dL   Calcium 8.1 (L) 8.9 - 10.3 mg/dL   Total Protein 7.3 6.5 - 8.1 g/dL   Albumin 3.9 3.5 - 5.0 g/dL   AST 127 (H) 15 - 41 U/L   ALT 253 (H) 17 - 63 U/L   Alkaline Phosphatase 115 38 - 126 U/L   Total Bilirubin 1.7 (H) 0.3 - 1.2 mg/dL   GFR calc non Af Amer >60 >60 mL/min   GFR calc Af Amer >60 >60 mL/min    Comment: (NOTE) The eGFR has been calculated using the CKD EPI equation. This calculation has not been validated in all clinical situations. eGFR's persistently <60  mL/min signify possible Chronic Kidney Disease.    Anion gap 11 5 - 15  CBC     Status: Abnormal   Collection Time: 02/22/17  2:05 AM  Result Value Ref Range   WBC 25.2 (H) 4.0 - 10.5 K/uL   RBC 5.05 4.22 - 5.81 MIL/uL   Hemoglobin 15.2 13.0 - 17.0 g/dL   HCT 44.8 39.0 - 52.0 %   MCV 88.7 78.0 - 100.0 fL   MCH 30.1 26.0 - 34.0 pg   MCHC 33.9 30.0 - 36.0 g/dL   RDW 13.3 11.5 - 15.5 %   Platelets 247 150 - 400 K/uL  Bilirubin, direct     Status: None   Collection Time: 02/22/17  2:05 AM  Result Value Ref Range   Bilirubin, Direct 0.4 0.1 - 0.5 mg/dL  Differential     Status: Abnormal   Collection Time: 02/22/17  2:05 AM  Result Value Ref Range   Neutrophils Relative % 87 %   Neutro Abs 21.8 (H) 1.7 - 7.7 K/uL   Lymphocytes Relative 6 %   Lymphs Abs 1.4 0.7 - 4.0 K/uL   Monocytes Relative 7 %   Monocytes Absolute  1.6 (H) 0.1 - 1.0 K/uL   Eosinophils Relative 0 %   Eosinophils Absolute 0.1 0.0 - 0.7 K/uL   Basophils Relative 0 %   Basophils Absolute 0.0 0.0 - 0.1 K/uL  Urinalysis, Routine w reflex microscopic     Status: Abnormal   Collection Time: 02/22/17  2:06 AM  Result Value Ref Range   Color, Urine AMBER (A) YELLOW    Comment: BIOCHEMICALS MAY BE AFFECTED BY COLOR   APPearance CLEAR CLEAR   Specific Gravity, Urine 1.027 1.005 - 1.030   pH 6.0 5.0 - 8.0   Glucose, UA NEGATIVE NEGATIVE mg/dL   Hgb urine dipstick NEGATIVE NEGATIVE   Bilirubin Urine NEGATIVE NEGATIVE   Ketones, ur 80 (A) NEGATIVE mg/dL   Protein, ur 100 (A) NEGATIVE mg/dL   Nitrite NEGATIVE NEGATIVE   Leukocytes, UA NEGATIVE NEGATIVE   RBC / HPF 6-30 0 - 5 RBC/hpf   WBC, UA 0-5 0 - 5 WBC/hpf   Bacteria, UA NONE SEEN NONE SEEN   Squamous Epithelial / LPF 0-5 (A) NONE SEEN   Mucus PRESENT   MRSA PCR Screening     Status: None   Collection Time: 02/22/17 10:10 AM  Result Value Ref Range   MRSA by PCR NEGATIVE NEGATIVE    Comment:        The GeneXpert MRSA Assay (FDA approved for NASAL specimens only), is one component of a comprehensive MRSA colonization surveillance program. It is not intended to diagnose MRSA infection nor to guide or monitor treatment for MRSA infections.   HIV antibody (Routine Testing)     Status: None   Collection Time: 02/22/17 11:22 AM  Result Value Ref Range   HIV Screen 4th Generation wRfx Non Reactive Non Reactive    Comment: (NOTE) Performed At: [facility] [place], [place] [id number] Lindon Romp MD OE:[id number]   Culture, blood (routine x 2)     Status: None (Preliminary result)   Collection Time: 02/22/17  2:16 PM  Result Value Ref Range   Specimen Description BLOOD LEFT HAND    Special Requests      BOTTLES DRAWN AEROBIC AND ANAEROBIC Blood Culture adequate volume   Culture NO GROWTH < 24 HOURS    Report Status PENDING   Hepatitis panel, acute  Status: None   Collection Time: 02/22/17  2:17 PM  Result Value Ref Range   Hepatitis B Surface Ag Negative Negative   HCV Ab <0.1 0.0 - 0.9 s/co ratio    Comment: (NOTE)                                  Negative:     < 0.8                             Indeterminate: 0.8 - 0.9                                  Positive:     > 0.9 The CDC recommends that a positive HCV antibody result be followed up with a HCV Nucleic Acid Amplification test (694854). Performed At: [facility] [place], [place] [id number] Lindon Romp MD FG:[id number]    Hep A IgM Negative Negative   Hep B C IgM Negative Negative  Culture, blood (routine x 2)     Status: None (Preliminary result)   Collection Time: 02/22/17  2:17 PM  Result Value Ref Range   Specimen Description RIGHT ANTECUBITAL    Special Requests      BOTTLES DRAWN AEROBIC AND ANAEROBIC Blood Culture adequate volume   Culture NO GROWTH < 24 HOURS    Report Status PENDING   Lithium level     Status: Abnormal   Collection Time: 02/22/17  2:17 PM  Result Value Ref Range   Lithium Lvl 0.25 (L) 0.60 - 1.20 mmol/L  Comprehensive metabolic panel     Status: Abnormal   Collection Time: 02/23/17  6:15 AM  Result Value Ref Range   Sodium 133 (L) 135 - 145 mmol/L   Potassium 3.7 3.5 - 5.1 mmol/L   Chloride 102 101 - 111 mmol/L   CO2 22 22 - 32 mmol/L   Glucose, Bld 93 65 - 99 mg/dL   BUN 19 6 - 20 mg/dL   Creatinine, Ser 0.70 0.61 - 1.24 mg/dL   Calcium 7.7 (L) 8.9 - 10.3 mg/dL   Total Protein 6.6 6.5 - 8.1 g/dL   Albumin 3.4 (L) 3.5 - 5.0 g/dL   AST 52 (H) 15 - 41 U/L   ALT 117 (H) 17 - 63 U/L   Alkaline Phosphatase 86 38 - 126 U/L   Total Bilirubin 1.5 (H) 0.3 - 1.2 mg/dL   GFR calc non Af Amer >60 >60 mL/min   GFR calc Af Amer >60 >60 mL/min    Comment: (NOTE) The eGFR has been calculated using the CKD EPI equation. This calculation has not been validated in all clinical situations. eGFR's persistently <60  mL/min signify possible Chronic Kidney Disease.    Anion gap 9 5 - 15  CBC     Status: Abnormal   Collection Time: 02/23/17  6:15 AM  Result Value Ref Range   WBC 26.7 (H) 4.0 - 10.5 K/uL   RBC 4.32 4.22 - 5.81 MIL/uL   Hemoglobin 12.9 (L) 13.0 - 17.0 g/dL   HCT 39.1 39.0 - 52.0 %   MCV 90.5 78.0 - 100.0 fL   MCH 29.9 26.0 - 34.0 pg   MCHC 33.0 30.0 - 36.0 g/dL   RDW 13.0 11.5 - 15.5 %   Platelets  257 150 - 400 K/uL  Lipase, blood     Status: Abnormal   Collection Time: 02/23/17  6:15 AM  Result Value Ref Range   Lipase 61 (H) 11 - 51 U/L  Lipid panel     Status: Abnormal   Collection Time: 02/23/17  6:15 AM  Result Value Ref Range   Cholesterol 115 0 - 200 mg/dL   Triglycerides 69 <150 mg/dL   HDL 34 (L) >40 mg/dL   Total CHOL/HDL Ratio 3.4 RATIO   VLDL 14 0 - 40 mg/dL   LDL Cholesterol 67 0 - 99 mg/dL    Comment:        Total Cholesterol/HDL:CHD Risk Coronary Heart Disease Risk Table                     Men   Women  1/2 Average Risk   3.4   3.3  Average Risk       5.0   4.4  2 X Average Risk   9.6   7.1  3 X Average Risk  23.4   11.0        Use the calculated Patient Ratio above and the CHD Risk Table to determine the patient's CHD Risk.        ATP III CLASSIFICATION (LDL):  <100     mg/dL   Optimal  100-129  mg/dL   Near or Above                    Optimal  130-159  mg/dL   Borderline  160-189  mg/dL   High  >190     mg/dL   Very High     Ct Abdomen Pelvis W Contrast  Result Date: 02/22/2017 CLINICAL DATA:  56 year old male with abdominal pain and vomiting for 4 days. Elevated lipase. Suspect pancreatitis. Prior appendectomy and back surgery (as on prior CT report). Initial encounter. EXAM: CT ABDOMEN AND PELVIS WITH CONTRAST TECHNIQUE: Multidetector CT imaging of the abdomen and pelvis was performed using the standard protocol following bolus administration of intravenous contrast. CONTRAST:  120m ISOVUE-300 IOPAMIDOL (ISOVUE-300) INJECTION 61% COMPARISON:   01/01/2016 chest CT. 04/19/2005 abdominal/pelvic CT. Lumbar spine plain film examination 12/07/2010. FINDINGS: Lower chest: Basilar atelectasis/scarring.  Heart top-normal size. Hepatobiliary: No worrisome hepatic lesion. Focal fatty infiltration adjacent to the fissure for the falciform ligament. No calcified gallstone or common bile duct stone. Pancreas: Diffuse inflammation most prominent pancreatic head level. Slightly heterogeneous pancreatic head/ uncinate process without discrete mass identified. Pancreas can be evaluated after pancreatitis is cleared. Spleen: No mass or enlargement. Adrenals/Urinary Tract: Adrenal glands unremarkable. Right renal cysts measuring up to 6 cm. Scarred kidneys greater on the right. No obstructing stone or hydronephrosis. Extra renal pelvis on the right. Noncontrast filled views of the urinary bladder unremarkable. Stomach/Bowel: Diffuse inflammation surrounds the duodenum which is most likely secondary to the pancreatitis rather than primary duodenal abnormality. Small hiatal hernia. Circumferential thickening distal esophagus may be related to under distension/reflux. Limited for evaluating for mass secondary to under distension. Vascular/Lymphatic: Mild atherosclerotic changes aorta, iliac artery is an right femoral artery. No abdominal aortic aneurysm or large vessel occlusion. Portal vein, splenic vein and superior mesenteric vein are patent. Small upper abdominal lymph nodes possibly reactive in origin. Reproductive: No worrisome abnormality. Other: Free fluid felt to be most consistent with fluid secondary to pancreatitis extends throughout the abdomen and upper pelvis Musculoskeletal: Extensive surgery with fusion T12 through S1 and corpectomy L4 level.  Remote T10 compression fracture with anterior wedge configuration with 90% loss height anteriorly with kyphosis centered at this level with the cord draped over this region. Prominent T11-12 degenerative changes.  IMPRESSION: Findings most consistent with pancreatitis. No evidence of calcified obstructing common bile duct stone or pancreatic mass. Evaluation for pancreatic lesion limited secondary to the degree of inflammation. Fluid throughout portions of the abdomen and pelvis felt most likely related to the pancreatitis. Secondary moderate inflammation of the duodenum. No venous thrombosis noted. Aortic Atherosclerosis (ICD10-I70.0). Right renal cysts largest 6 cm. Renal scarring greater on the right. No hydronephrosis. Small hiatal hernia. Circumferential thickening distal esophagus may be related to under distension/reflux. Limited for evaluating for mass secondary to under distension. Extensive surgery with fusion T12 through S1 and corpectomy L4 level. Remote T10 compression fracture with anterior wedge configuration with 90% loss height anteriorly with kyphosis centered at this level with the cord draped over this region. Prominent T11-12 degenerative changes. Electronically Signed   By: Genia Del M.D.   On: 02/22/2017 07:18   US Abdomen Limited  Result Date: 02/23/2017 CLINICAL DATA:  Evaluate for gallstones. Increased liver function tests. Abdominal pain and vomiting. Elevated lipase. EXAM: ULTRASOUND ABDOMEN LIMITED RIGHT UPPER QUADRANT COMPARISON:  CT abdomen pelvis 02/22/2017. FINDINGS: Gallbladder: Gallbladder calculi are present with layering sludge. At least one stone measures 10 mm in longest dimension. Gallbladder wall thickness 3 mm. Negative sonographic Murphy's sign. Common bile duct: Diameter: Dilated, up to 8.3 mm. No common duct stones could be identified. Liver: Increased echogenicity consistent with steatosis. Portal vein is patent on color Doppler imaging with normal direction of blood flow towards the liver. IMPRESSION: Cholelithiasis and gallbladder sludge. Extrahepatic biliary ductal dilatation, common bile duct diameter greater than 8 mm. Electronically Signed   By: Staci Righter M.D.    On: 02/23/2017 09:32    ROS:  A comprehensive review of systems was negative except for: Gastrointestinal: positive for abdominal pain Musculoskeletal: positive for back pain  Blood pressure 127/75, pulse (!) 102, temperature 98.1 F (36.7 C), temperature source Oral, resp. rate 20, height _0  (1.676 m), weight 210 lb 15.7 oz (95.7 kg), SpO2 96 %. Physical Exam  Constitutional: He is oriented to person, place, and time and well-developed, well-nourished, and in no distress.  HENT:  Head: Normocephalic and atraumatic.  Eyes: Pupils are equal, round, and reactive to light.  Cardiovascular: Normal rate and regular rhythm.   Pulmonary/Chest: Effort normal and breath sounds normal.  Abdominal: Soft. He exhibits distension. There is tenderness. There is no rebound and no guarding.  Large left lateral wall laxity with scar, small mid line scar, and small umbilical hernia, pain over the epigastric and RUQ region  Musculoskeletal: Normal range of motion.  Neurological: He is alert and oriented to person, place, and time.  Skin: Skin is warm and dry.  Psychiatric:  Poor historian/ slow to answer   Assessment/Plan: Craig [name] is a 56 yo with gallstone pancreatitis and continued abdominal pain. He reports minimal improvement since his admission. He continues to have a WBC and this is possibly reactive from his inflammation to his pancreas.  Will continue to follow for potential cholecystectomy but given his abdominal wall may not be possible laparoscopically. He reports that he had a back surgery through the left abdominal wall incision and the midline was for a hernia that was repaired with mesh.  -Earliest surgery could be pending clinical improvement is Friday  Virl Cagey 02/23/2017, 8:47 PM

## 2017-02-24 LAB — CBC WITH DIFFERENTIAL/PLATELET
BASOS PCT: 0 %
Basophils Absolute: 0 10*3/uL (ref 0.0–0.1)
EOS ABS: 0.3 10*3/uL (ref 0.0–0.7)
Eosinophils Relative: 1 %
HEMATOCRIT: 35.7 % — AB (ref 39.0–52.0)
HEMOGLOBIN: 11.9 g/dL — AB (ref 13.0–17.0)
LYMPHS ABS: 1.3 10*3/uL (ref 0.7–4.0)
Lymphocytes Relative: 6 %
MCH: 30.4 pg (ref 26.0–34.0)
MCHC: 33.3 g/dL (ref 30.0–36.0)
MCV: 91.1 fL (ref 78.0–100.0)
MONO ABS: 2.2 10*3/uL — AB (ref 0.1–1.0)
MONOS PCT: 10 %
NEUTROS ABS: 18.6 10*3/uL — AB (ref 1.7–7.7)
NEUTROS PCT: 83 %
Platelets: 277 10*3/uL (ref 150–400)
RBC: 3.92 MIL/uL — ABNORMAL LOW (ref 4.22–5.81)
RDW: 13.1 % (ref 11.5–15.5)
WBC: 22.4 10*3/uL — ABNORMAL HIGH (ref 4.0–10.5)

## 2017-02-24 LAB — BASIC METABOLIC PANEL
Anion gap: 11 (ref 5–15)
BUN: 13 mg/dL (ref 6–20)
CALCIUM: 8.1 mg/dL — AB (ref 8.9–10.3)
CHLORIDE: 105 mmol/L (ref 101–111)
CO2: 18 mmol/L — AB (ref 22–32)
CREATININE: 0.65 mg/dL (ref 0.61–1.24)
GFR calc Af Amer: >60 mL/min (ref >60)
GFR calc non Af Amer: >60 mL/min (ref >60)
Glucose, Bld: 80 mg/dL (ref 65–99)
Potassium: 3.7 mmol/L (ref 3.5–5.1)
Sodium: 134 mmol/L — ABNORMAL LOW (ref 135–145)

## 2017-02-24 LAB — HEPATIC FUNCTION PANEL
ALBUMIN: 3.1 g/dL — AB (ref 3.5–5.0)
ALT: 84 U/L — AB (ref 17–63)
AST: 35 U/L (ref 15–41)
Alkaline Phosphatase: 88 U/L (ref 38–126)
BILIRUBIN DIRECT: 0.3 mg/dL (ref 0.1–0.5)
BILIRUBIN TOTAL: 1.5 mg/dL — AB (ref 0.3–1.2)
Indirect Bilirubin: 1.2 mg/dL — ABNORMAL HIGH (ref 0.3–0.9)
Total Protein: 6.8 g/dL (ref 6.5–8.1)

## 2017-02-24 LAB — LIPASE, BLOOD: Lipase: 34 U/L (ref 11–51)

## 2017-02-24 MED ORDER — PANTOPRAZOLE SODIUM 40 MG PO TBEC
40.0000 mg | DELAYED_RELEASE_TABLET | Freq: Every day | ORAL | Status: DC
  Administered 2017-02-24 – 2017-03-03 (×7): 40 mg via ORAL
  Filled 2017-02-24 (×8): qty 1

## 2017-02-24 NOTE — Progress Notes (Signed)
Agree with cholecystectomy at earliest convenience patient has essentially no abdominal pain today transaminases trended downward alkaline phosphatase normal pro calcitonin negative patient does have leukocytosis presumably due to generalized illness pancreatitis. She currently on dual antibiotics hopefully cholecystectomy can occur Friday with discharge several days thereafter Craig [name] WJX:[id number] DOB: January 07, 1961 DOA: 02/22/2017 PCP: Oval Linsey, MD   Physical Exam: Blood pressure (!) 137/93, pulse (!) 112, temperature 98.3 F (36.8 C), temperature source Oral, resp. rate 20, height  (1.676 m), weight 95.7 kg (210 lb 15.7 oz), SpO2 96 %. Lungs show prolonged expiratory phase scattered rhonchi no rales no wheezes audible heart regular rhythm no S3 or S4 no heaves thrills rubs abdomen soft no borborygmi no guarding no rebound no masses no megaly thickened right upper quadrant tenderness today   Investigations:  Recent Results (from the past 240 hour(s))  MRSA PCR Screening     Status: None   Collection Time: 02/22/17 10:10 AM  Result Value Ref Range Status   MRSA by PCR NEGATIVE NEGATIVE Final    Comment:        The GeneXpert MRSA Assay (FDA approved for NASAL specimens only), is one component of a comprehensive MRSA colonization surveillance program. It is not intended to diagnose MRSA infection nor to guide or monitor treatment for MRSA infections.   Culture, blood (routine x 2)     Status: None (Preliminary result)   Collection Time: 02/22/17  2:16 PM  Result Value Ref Range Status   Specimen Description BLOOD LEFT HAND  Final   Special Requests   Final    BOTTLES DRAWN AEROBIC AND ANAEROBIC Blood Culture adequate volume   Culture NO GROWTH 2 DAYS  Final   Report Status PENDING  Incomplete  Culture, blood (routine x 2)     Status: None (Preliminary result)   Collection Time: 02/22/17  2:17 PM  Result Value Ref Range Status   Specimen Description RIGHT  ANTECUBITAL  Final   Special Requests   Final    BOTTLES DRAWN AEROBIC AND ANAEROBIC Blood Culture adequate volume   Culture NO GROWTH 2 DAYS  Final   Report Status PENDING  Incomplete     Basic Metabolic Panel:  Recent Labs  21/30/86 0615 02/24/17 0640  NA 133* 134*  K 3.7 3.7  CL 102 105  CO2 22 18*  GLUCOSE 93 80  BUN 19 13  CREATININE 0.70 0.65  CALCIUM 7.7* 8.1*   Liver Function Tests:  Recent Labs  02/23/17 0615 02/24/17 0640  AST 52* 35  ALT 117* 84*  ALKPHOS 86 88  BILITOT 1.5* 1.5*  PROT 6.6 6.8  ALBUMIN 3.4* 3.1*     CBC:  Recent Labs  02/22/17 0205 02/23/17 0615 02/24/17 0640  WBC 25.2* 26.7* 22.4*  NEUTROABS 21.8*  --  18.6*  HGB 15.2 12.9* 11.9*  HCT 44.8 39.1 35.7*  MCV 88.7 90.5 91.1  PLT 247 257 277    US Abdomen Limited  Result Date: 02/23/2017 CLINICAL DATA:  Evaluate for gallstones. Increased liver function tests. Abdominal pain and vomiting. Elevated lipase. EXAM: ULTRASOUND ABDOMEN LIMITED RIGHT UPPER QUADRANT COMPARISON:  CT abdomen pelvis 02/22/2017. FINDINGS: Gallbladder: Gallbladder calculi are present with layering sludge. At least one stone measures 10 mm in longest dimension. Gallbladder wall thickness 3 mm. Negative sonographic Murphy's sign. Common bile duct: Diameter: Dilated, up to 8.3 mm. No common duct stones could be identified. Liver: Increased echogenicity consistent with steatosis. Portal vein is patent on color Doppler imaging  with normal direction of blood flow towards the liver. IMPRESSION: Cholelithiasis and gallbladder sludge. Extrahepatic biliary ductal dilatation, common bile duct diameter greater than 8 mm. Electronically Signed   By: Elsie Stain M.D.   On: 02/23/2017 09:32      Medication  Impression:  Active Problems:   Acute pancreatitis without infection or necrosis   Elevated liver enzymes     Plan: Cholecystectomy at her earliest convenience as per general surgery. Continue antibiotics. Nothing  by mouth. Repeat liver function tests lipase and CBC in a.m.  Consultants: Gastroenterology and general surgery   Procedures   Antibiotics:          Time spent: 30 minutes   LOS: 2 days   [name]   02/24/2017, 1:36 PM

## 2017-02-24 NOTE — Progress Notes (Signed)
Subjective:  Hungry. Guard states patient woke up confused and thought he was at jail. Quickly reoriented. Patient states abdominal pain is better. Much more interactive today.   Objective: Vital signs in last 24 hours: Temp:  [97.9 F (36.6 C)-98.3 F (36.8 C)] 98.3 F (36.8 C) (09/20 0500) Pulse Rate:  [102-112] 112 (09/20 0500) Resp:  [20] 20 (09/20 0500) BP: (123-137)/(75-93) 137/93 (09/20 0500) SpO2:  [96 %] 96 % (09/20 0500) Last BM Date: 02/21/17 General:   Alert,  Well-developed, well-nourished, pleasant and cooperative in NAD Head:  Normocephalic and atraumatic. Eyes:  Sclera clear, no icterus.  Abdomen:  Soft, moderate epigastric tenderness and nondistended.  Normal bowel sounds, without guarding, and without rebound.   Extremities:  Without clubbing, deformity or edema. Neurologic:  Alert and  oriented x4;  grossly normal neurologically. Skin:  Intact without significant lesions or rashes. Psych:  Alert and cooperative. Normal mood and affect.  Intake/Output from previous day: 09/19 0701 - 09/20 0700 In: 0  Out: 1200 [Urine:1200] Intake/Output this shift: No intake/output data recorded.  Lab Results: CBC  Recent Labs  02/22/17 0205 02/23/17 0615 02/24/17 0640  WBC 25.2* 26.7* 22.4*  HGB 15.2 12.9* 11.9*  HCT 44.8 39.1 35.7*  MCV 88.7 90.5 91.1  PLT 247 257 277   BMET  Recent Labs  02/22/17 0205 02/23/17 0615 02/24/17 0640  NA 133* 133* 134*  K 3.6 3.7 3.7  CL 99* 102 105  CO2 23 22 18*  GLUCOSE 116* 93 80  BUN 23* 19 13  CREATININE 0.74 0.70 0.65  CALCIUM 8.1* 7.7* 8.1*   LFTs  Recent Labs  02/22/17 0205 02/23/17 0615 02/24/17 0640  BILITOT 1.7* 1.5* 1.5*  BILIDIR 0.4  --  0.3  IBILI  --   --  1.2*  ALKPHOS 115 86 88  AST 127* 52* 35  ALT 253* 117* 84*  PROT 7.3 6.6 6.8  ALBUMIN 3.9 3.4* 3.1*    Recent Labs  02/22/17 0205 02/23/17 0615 02/24/17 0640  LIPASE 212* 61* 34   PT/INR No results for input(s): LABPROT, INR in the  last 72 hours.    Imaging Studies: Ct Abdomen Pelvis W Contrast  Result Date: 02/22/2017 CLINICAL DATA:  56 year old male with abdominal pain and vomiting for 4 days. Elevated lipase. Suspect pancreatitis. Prior appendectomy and back surgery (as on prior CT report). Initial encounter. EXAM: CT ABDOMEN AND PELVIS WITH CONTRAST TECHNIQUE: Multidetector CT imaging of the abdomen and pelvis was performed using the standard protocol following bolus administration of intravenous contrast. CONTRAST:  162m ISOVUE-300 IOPAMIDOL (ISOVUE-300) INJECTION 61% COMPARISON:  01/01/2016 chest CT. 04/19/2005 abdominal/pelvic CT. Lumbar spine plain film examination 12/07/2010. FINDINGS: Lower chest: Basilar atelectasis/scarring.  Heart top-normal size. Hepatobiliary: No worrisome hepatic lesion. Focal fatty infiltration adjacent to the fissure for the falciform ligament. No calcified gallstone or common bile duct stone. Pancreas: Diffuse inflammation most prominent pancreatic head level. Slightly heterogeneous pancreatic head/ uncinate process without discrete mass identified. Pancreas can be evaluated after pancreatitis is cleared. Spleen: No mass or enlargement. Adrenals/Urinary Tract: Adrenal glands unremarkable. Right renal cysts measuring up to 6 cm. Scarred kidneys greater on the right. No obstructing stone or hydronephrosis. Extra renal pelvis on the right. Noncontrast filled views of the urinary bladder unremarkable. Stomach/Bowel: Diffuse inflammation surrounds the duodenum which is most likely secondary to the pancreatitis rather than primary duodenal abnormality. Small hiatal hernia. Circumferential thickening distal esophagus may be related to under distension/reflux. Limited for evaluating for mass secondary to under  distension. Vascular/Lymphatic: Mild atherosclerotic changes aorta, iliac artery is an right femoral artery. No abdominal aortic aneurysm or large vessel occlusion. Portal vein, splenic vein and superior  mesenteric vein are patent. Small upper abdominal lymph nodes possibly reactive in origin. Reproductive: No worrisome abnormality. Other: Free fluid felt to be most consistent with fluid secondary to pancreatitis extends throughout the abdomen and upper pelvis Musculoskeletal: Extensive surgery with fusion T12 through S1 and corpectomy L4 level. Remote T10 compression fracture with anterior wedge configuration with 90% loss height anteriorly with kyphosis centered at this level with the cord draped over this region. Prominent T11-12 degenerative changes. IMPRESSION: Findings most consistent with pancreatitis. No evidence of calcified obstructing common bile duct stone or pancreatic mass. Evaluation for pancreatic lesion limited secondary to the degree of inflammation. Fluid throughout portions of the abdomen and pelvis felt most likely related to the pancreatitis. Secondary moderate inflammation of the duodenum. No venous thrombosis noted. Aortic Atherosclerosis (ICD10-I70.0). Right renal cysts largest 6 cm. Renal scarring greater on the right. No hydronephrosis. Small hiatal hernia. Circumferential thickening distal esophagus may be related to under distension/reflux. Limited for evaluating for mass secondary to under distension. Extensive surgery with fusion T12 through S1 and corpectomy L4 level. Remote T10 compression fracture with anterior wedge configuration with 90% loss height anteriorly with kyphosis centered at this level with the cord draped over this region. Prominent T11-12 degenerative changes. Electronically Signed   By: Genia Del M.D.   On: 02/22/2017 07:18   US Abdomen Limited  Result Date: 02/23/2017 CLINICAL DATA:  Evaluate for gallstones. Increased liver function tests. Abdominal pain and vomiting. Elevated lipase. EXAM: ULTRASOUND ABDOMEN LIMITED RIGHT UPPER QUADRANT COMPARISON:  CT abdomen pelvis 02/22/2017. FINDINGS: Gallbladder: Gallbladder calculi are present with layering sludge. At  least one stone measures 10 mm in longest dimension. Gallbladder wall thickness 3 mm. Negative sonographic Murphy's sign. Common bile duct: Diameter: Dilated, up to 8.3 mm. No common duct stones could be identified. Liver: Increased echogenicity consistent with steatosis. Portal vein is patent on color Doppler imaging with normal direction of blood flow towards the liver. IMPRESSION: Cholelithiasis and gallbladder sludge. Extrahepatic biliary ductal dilatation, common bile duct diameter greater than 8 mm. Electronically Signed   By: Staci Righter M.D.   On: 02/23/2017 09:32  [2 weeks]   Assessment: 56 year old male admitted with acute pancreatitis, elevated transaminases and total bili (normal alkphos), unclear etiology. ?biliary pancreatitis. Reviewed medications and no obvious offenders taking as outpatient. Lipid panel unremarkable. CT without gallstones or biliary dilatation. U/S confirmed cholelithiasis, no acute cholecystitis but dilated CBD to 8.3 mm without obvious CBD stone. Leukocytosis noted on admission and is afebrile currently, likely reactive in setting of pancreatitis.  LFTs improved.   As separate issue: brother with history of colon cancer <81 years of age, succumbing to disease. No prior colonoscopy. Esophageal thickening on CT could be due to underdistension vs reflux, but even if related to underdistension, would favor EGD as outpatient due to chronic GERD.   Plan: 1. Add PPI.  2. Clear liquids. 3. Continue supportive measures.  4. EGD/TCS with MAC as outpatient.   Laureen Ochs. Bernarda Caffey [facility] [phone] 9/20/20189:43 AM     LOS: 2 days

## 2017-02-24 NOTE — Progress Notes (Signed)
[facility]  Subjective: Pain improved. Feeling better. WBC remains elevated.  Discussed with Dr. Janna Arch regarding surgery Monday.  Objective: Vital signs in last 24 hours: Temp:  [98.1 F (36.7 C)-98.3 F (36.8 C)] 98.3 F (36.8 C) (09/20 0500) Pulse Rate:  [102-112] 112 (09/20 0500) Resp:  [20] 20 (09/20 0500) BP: (127-137)/(75-93) 137/93 (09/20 0500) SpO2:  [96 %] 96 % (09/20 0500) Last BM Date: 02/21/17  Intake/Output from previous day: 09/19 0701 - 09/20 0700 In: 0  Out: 1200 [Urine:1200] Intake/Output this shift: No intake/output data recorded.  General appearance: alert, cooperative and no distress Resp: normal work breathing GI: soft, non-tender; bowel sounds normal; no masses,  no organomegaly and midline scar and left lateral abdominal wall scar and laxity  Lab Results:   Recent Labs  02/23/17 0615 02/24/17 0640  WBC 26.7* 22.4*  HGB 12.9* 11.9*  HCT 39.1 35.7*  PLT 257 277   BMET  Recent Labs  02/23/17 0615 02/24/17 0640  NA 133* 134*  K 3.7 3.7  CL 102 105  CO2 22 18*  GLUCOSE 93 80  BUN 19 13  CREATININE 0.70 0.65  CALCIUM 7.7* 8.1*   PT/INR No results for input(s): LABPROT, INR in the last 72 hours.  Studies/Results: US Abdomen Limited  Result Date: 02/23/2017 CLINICAL DATA:  Evaluate for gallstones. Increased liver function tests. Abdominal pain and vomiting. Elevated lipase. EXAM: ULTRASOUND ABDOMEN LIMITED RIGHT UPPER QUADRANT COMPARISON:  CT abdomen pelvis 02/22/2017. FINDINGS: Gallbladder: Gallbladder calculi are present with layering sludge. At least one stone measures 10 mm in longest dimension. Gallbladder wall thickness 3 mm. Negative sonographic Murphy's sign. Common bile duct: Diameter: Dilated, up to 8.3 mm. No common duct stones could be identified. Liver: Increased echogenicity consistent with steatosis. Portal vein is patent on color Doppler imaging with normal direction of blood flow towards the liver.  IMPRESSION: Cholelithiasis and gallbladder sludge. Extrahepatic biliary ductal dilatation, common bile duct diameter greater than 8 mm. Electronically Signed   By: Elsie Stain M.D.   On: 02/23/2017 09:32    Anti-infectives: Anti-infectives    Start     Dose/Rate Route Frequency Ordered Stop   02/22/17 1315  piperacillin-tazobactam (ZOSYN) IVPB 3.375 g     3.375 g 12.5 mL/hr over 240 Minutes Intravenous Every 8 hours 02/22/17 1302        Assessment/Plan: Mr. Craig [name] is a 56 yo with gallstone pancreatitis. Improving pain and WBC.  Still with elevated WBC, likely due to the inflammation of the pancreas.  Will plan for surgery Monday. Have discussed with Dr. Janna Arch.     LOS: 2 days    Lucretia Roers 02/24/2017

## 2017-02-25 LAB — HEPATIC FUNCTION PANEL
ALBUMIN: 2.7 g/dL — AB (ref 3.5–5.0)
ALT: 57 U/L (ref 17–63)
AST: 30 U/L (ref 15–41)
Alkaline Phosphatase: 83 U/L (ref 38–126)
BILIRUBIN TOTAL: 1.1 mg/dL (ref 0.3–1.2)
Bilirubin, Direct: 0.1 mg/dL (ref 0.1–0.5)
Indirect Bilirubin: 1 mg/dL — ABNORMAL HIGH (ref 0.3–0.9)
TOTAL PROTEIN: 6.1 g/dL — AB (ref 6.5–8.1)

## 2017-02-25 LAB — LIPASE, BLOOD: LIPASE: 24 U/L (ref 11–51)

## 2017-02-25 NOTE — Plan of Care (Signed)
Problem: Safety: Goal: Ability to remain free from injury will improve Patient has 2 [place] deputies at bedside

## 2017-02-25 NOTE — Progress Notes (Signed)
Patient labs improved, LFT normalized today (including bilirubin). Lipase normal. Planned cholecystectomy on Monday (9/24). We will sign off at this time. Please notify us if our assistance is needed moving forward.   Thank you for allowing [place] to participate in the care of Craig [name]  Craig Doughtie, DNP, AGNP-C Adult & Gerontological Nurse Practitioner [facility]

## 2017-02-25 NOTE — Progress Notes (Signed)
  Subjective: Doing well. Tolerated clears. Feeling good. No pain or nausea.   Objective: Vital signs in last 24 hours: Temp:  [97.8 F (36.6 C)-99.5 F (37.5 C)] 99.5 F (37.5 C) (09/21 0543) Pulse Rate:  [90-101] 90 (09/21 0543) Resp:  [20] 20 (09/21 0543) BP: (123-137)/(72-75) 123/73 (09/21 0543) SpO2:  [95 %-97 %] 96 % (09/21 0759) Last BM Date: 02/21/17  Intake/Output from previous day: 09/20 0701 - 09/21 0700 In: 4186.7 [P.O.:720; I.V.:3116.7; IV Piggyback:350] Out: 1225 [Urine:1225] Intake/Output this shift: Total I/O In: 360 [P.O.:360] Out: 450 [Urine:450]  General appearance: alert, cooperative and no distress Resp: normal work breathing GI: soft, non-tender; bowel sounds normal; no masses,  no organomegaly  Lab Results:   Recent Labs  02/23/17 0615 02/24/17 0640  WBC 26.7* 22.4*  HGB 12.9* 11.9*  HCT 39.1 35.7*  PLT 257 277   BMET  Recent Labs  02/23/17 0615 02/24/17 0640  NA 133* 134*  K 3.7 3.7  CL 102 105  CO2 22 18*  GLUCOSE 93 80  BUN 19 13  CREATININE 0.70 0.65  CALCIUM 7.7* 8.1*   PT/INR No results for input(s): LABPROT, INR in the last 72 hours.  Studies/Results: No results found.  Anti-infectives: Anti-infectives    Start     Dose/Rate Route Frequency Ordered Stop   02/22/17 1315  piperacillin-tazobactam (ZOSYN) IVPB 3.375 g     3.375 g 12.5 mL/hr over 240 Minutes Intravenous Every 8 hours 02/22/17 1302        Assessment/Plan: Craig [name] 56 yo with gallstone pancreatitis plans for LAPAROSCOPIC CHOLECYSTECTOMY on Monday. - Can advanced to full liquids as tolerates versus soft/ low fat diet.  - Can shower if wants    LOS: 3 days    Lucretia Roers 02/25/2017

## 2017-02-25 NOTE — Progress Notes (Signed)
Pharmacy Antibiotic Note  Craig [name] is a 56 y.o. male admitted on 02/22/2017 with intra-abdominal infection.  Pharmacy has been consulted for ZOSYN dosing.  Plan: Continue Zosyn 3.375gm IV q8h, EID Monitor labs, progress, c/s  Height:  (167.6 cm) Weight: 210 lb 15.7 oz (95.7 kg) IBW/kg (Calculated) : 63.8  Temp (24hrs), Avg:98.5 F (36.9 C), Min:97.8 F (36.6 C), Max:99.5 F (37.5 C)   Recent Labs Lab 02/22/17 0205 02/23/17 0615 02/24/17 0640  WBC 25.2* 26.7* 22.4*  CREATININE 0.74 0.70 0.65    Estimated Creatinine Clearance: 113 mL/min (by C-G formula based on SCr of 0.65 mg/dL).   No Known Allergies  Antimicrobials this admission: Zosyn 9/18 >>   Microbiology results:  MRSA PCR: (-)  Thank you for allowing pharmacy to be a part of this patient's care.  Talbert Cage [place]et 02/25/2017 10:58 AM

## 2017-02-25 NOTE — Progress Notes (Signed)
Transaminases as well as lipase returned to normal today patient tolerating clear liquids without precipitating postprandial pain no nausea no vomiting scheduled for elective cholecystectomy on Monday continue with clear liquids will consider advancing to full liquids tomorrow depending on how he tolerates it Craig [name] ZOX:[id number] DOB: September 27, 1960 DOA: 02/22/2017 PCP: Oval Linsey, MD   Physical Exam: Blood pressure 123/73, pulse 90, temperature 99.5 F (37.5 C), temperature source Oral, resp. rate 20, height  (1.676 m), weight 95.7 kg (210 lb 15.7 oz), SpO2 96 %. Lungs show prolonged respiratory phase scattered rhonchi no rales audible no end expiratory wheeze audible heart regular rhythm no S3 or S4 no heaves thrills rubs abdomen obese soft nontender no right upper quadrant tenderness no peristaltic rushes noted   Investigations:  Recent Results (from the past 240 hour(s))  MRSA PCR Screening     Status: None   Collection Time: 02/22/17 10:10 AM  Result Value Ref Range Status   MRSA by PCR NEGATIVE NEGATIVE Final    Comment:        The GeneXpert MRSA Assay (FDA approved for NASAL specimens only), is one component of a comprehensive MRSA colonization surveillance program. It is not intended to diagnose MRSA infection nor to guide or monitor treatment for MRSA infections.   Culture, blood (routine x 2)     Status: None (Preliminary result)   Collection Time: 02/22/17  2:16 PM  Result Value Ref Range Status   Specimen Description BLOOD LEFT HAND  Final   Special Requests   Final    BOTTLES DRAWN AEROBIC AND ANAEROBIC Blood Culture adequate volume   Culture NO GROWTH 3 DAYS  Final   Report Status PENDING  Incomplete  Culture, blood (routine x 2)     Status: None (Preliminary result)   Collection Time: 02/22/17  2:17 PM  Result Value Ref Range Status   Specimen Description RIGHT ANTECUBITAL  Final   Special Requests   Final    BOTTLES DRAWN AEROBIC AND  ANAEROBIC Blood Culture adequate volume   Culture NO GROWTH 3 DAYS  Final   Report Status PENDING  Incomplete     Basic Metabolic Panel:  Recent Labs  81/19/14 0615 02/24/17 0640  NA 133* 134*  K 3.7 3.7  CL 102 105  CO2 22 18*  GLUCOSE 93 80  BUN 19 13  CREATININE 0.70 0.65  CALCIUM 7.7* 8.1*   Liver Function Tests:  Recent Labs  02/24/17 0640 02/25/17 0643  AST 35 30  ALT 84* 57  ALKPHOS 88 83  BILITOT 1.5* 1.1  PROT 6.8 6.1*  ALBUMIN 3.1* 2.7*     CBC:  Recent Labs  02/23/17 0615 02/24/17 0640  WBC 26.7* 22.4*  NEUTROABS  --  18.6*  HGB 12.9* 11.9*  HCT 39.1 35.7*  MCV 90.5 91.1  PLT 257 277    No results found.    Medications:  Impression:  Active Problems:   Acute gallstone pancreatitis   Elevated liver enzymes     Plan: Continue clear liquids monitor liver function tests on Sunday as well as lipase continue Zosyn   Consultants: Gastroenterology and general surgery    Procedures   Antibiotics: Zosyn         Time spent: 30 minutes   LOS: 3 days   Craig [name] M   02/25/2017, 12:54 PM

## 2017-02-26 LAB — HEPATIC FUNCTION PANEL
ALT: 45 U/L (ref 17–63)
AST: 32 U/L (ref 15–41)
Albumin: 2.4 g/dL — ABNORMAL LOW (ref 3.5–5.0)
Alkaline Phosphatase: 83 U/L (ref 38–126)
BILIRUBIN DIRECT: 0.2 mg/dL (ref 0.1–0.5)
BILIRUBIN TOTAL: 1.1 mg/dL (ref 0.3–1.2)
Indirect Bilirubin: 0.9 mg/dL (ref 0.3–0.9)
Total Protein: 5.7 g/dL — ABNORMAL LOW (ref 6.5–8.1)

## 2017-02-26 LAB — BASIC METABOLIC PANEL
Anion gap: 12 (ref 5–15)
BUN: 6 mg/dL (ref 6–20)
CHLORIDE: 105 mmol/L (ref 101–111)
CO2: 19 mmol/L — AB (ref 22–32)
CREATININE: 0.52 mg/dL — AB (ref 0.61–1.24)
Calcium: 8.1 mg/dL — ABNORMAL LOW (ref 8.9–10.3)
GFR calc non Af Amer: >60 mL/min (ref >60)
Glucose, Bld: 87 mg/dL (ref 65–99)
Potassium: 3 mmol/L — ABNORMAL LOW (ref 3.5–5.1)
Sodium: 136 mmol/L (ref 135–145)

## 2017-02-26 LAB — LIPASE, BLOOD: Lipase: 22 U/L (ref 11–51)

## 2017-02-26 NOTE — Progress Notes (Signed)
Patient tolerating clear liquid diets well no postprandial pain will get CBCs liver function tests and be met in a.m. Sunday. Jasir D XXXSanders MRN:[id number] DOB: 10/25/1960 DOA: 02/22/2017 PCP: , , MD   Physical Exam: Blood pressure 120/70, pulse 76, temperature 98.3 F (36.8 C), temperature source Oral, resp. rate 18, height 5' 6" (1.676 m), weight 95.7 kg (210 lb 15.7 oz), SpO2 95 %. Lungs prolonged respiratory phase scattered rhonchi bilaterally no rales no wheezes audible heart regular rhythm no S3-S4 no heaves thrills rubs abdomen soft nontender bowel sounds normoactive no peristaltic rushes no right upper quadrant tenderness no rebound noted   Investigations:  Recent Results (from the past 240 hour(s))  MRSA PCR Screening     Status: None   Collection Time: 02/22/17 10:10 AM  Result Value Ref Range Status   MRSA by PCR NEGATIVE NEGATIVE Final    Comment:        The GeneXpert MRSA Assay (FDA approved for NASAL specimens only), is one component of a comprehensive MRSA colonization surveillance program. It is not intended to diagnose MRSA infection nor to guide or monitor treatment for MRSA infections.   Culture, blood (routine x 2)     Status: None (Preliminary result)   Collection Time: 02/22/17  2:16 PM  Result Value Ref Range Status   Specimen Description BLOOD LEFT HAND  Final   Special Requests   Final    BOTTLES DRAWN AEROBIC AND ANAEROBIC Blood Culture adequate volume   Culture NO GROWTH 4 DAYS  Final   Report Status PENDING  Incomplete  Culture, blood (routine x 2)     Status: None (Preliminary result)   Collection Time: 02/22/17  2:17 PM  Result Value Ref Range Status   Specimen Description RIGHT ANTECUBITAL  Final   Special Requests   Final    BOTTLES DRAWN AEROBIC AND ANAEROBIC Blood Culture adequate volume   Culture NO GROWTH 4 DAYS  Final   Report Status PENDING  Incomplete     Basic Metabolic Panel:  Recent Labs  02/24/17 0640  02/26/17 0552  NA 134* 136  K 3.7 3.0*  CL 105 105  CO2 18* 19*  GLUCOSE 80 87  BUN 13 6  CREATININE 0.65 0.52*  CALCIUM 8.1* 8.1*   Liver Function Tests:  Recent Labs  02/25/17 0643 02/26/17 0552  AST 30 32  ALT 57 45  ALKPHOS 83 83  BILITOT 1.1 1.1  PROT 6.1* 5.7*  ALBUMIN 2.7* 2.4*     CBC:  Recent Labs  02/24/17 0640  WBC 22.4*  NEUTROABS 18.6*  HGB 11.9*  HCT 35.7*  MCV 91.1  PLT 277    No results found.    Medications:   Impression:  Active Problems:   Acute gallstone pancreatitis   Elevated liver enzymes     Plan: For cholecystectomy Monday a.m. continue clear liquids monitor LFTs he met and CBC in a.m.   Consultants: Gastroenterology and general surgery   Procedures   Antibiotics: Zosyn          Time spent: 30 minutes   LOS: 4 days   , M   02/26/2017, 10:56 AM    

## 2017-02-27 LAB — HEPATIC FUNCTION PANEL
ALT: 40 U/L (ref 17–63)
AST: 32 U/L (ref 15–41)
Albumin: 2.3 g/dL — ABNORMAL LOW (ref 3.5–5.0)
Alkaline Phosphatase: 86 U/L (ref 38–126)
BILIRUBIN DIRECT: 0.2 mg/dL (ref 0.1–0.5)
BILIRUBIN INDIRECT: 0.5 mg/dL (ref 0.3–0.9)
TOTAL PROTEIN: 5.6 g/dL — AB (ref 6.5–8.1)
Total Bilirubin: 0.7 mg/dL (ref 0.3–1.2)

## 2017-02-27 LAB — BASIC METABOLIC PANEL
Anion gap: 11 (ref 5–15)
CALCIUM: 8 mg/dL — AB (ref 8.9–10.3)
CO2: 21 mmol/L — ABNORMAL LOW (ref 22–32)
CREATININE: 0.47 mg/dL — AB (ref 0.61–1.24)
Chloride: 102 mmol/L (ref 101–111)
GFR calc Af Amer: >60 mL/min (ref >60)
GLUCOSE: 128 mg/dL — AB (ref 65–99)
Potassium: 2.7 mmol/L — CL (ref 3.5–5.1)
SODIUM: 134 mmol/L — AB (ref 135–145)

## 2017-02-27 LAB — CBC WITH DIFFERENTIAL/PLATELET
BASOS ABS: 0 10*3/uL (ref 0.0–0.1)
Basophils Relative: 0 %
EOS ABS: 0.2 10*3/uL (ref 0.0–0.7)
Eosinophils Relative: 1 %
HEMATOCRIT: 32 % — AB (ref 39.0–52.0)
HEMOGLOBIN: 10.9 g/dL — AB (ref 13.0–17.0)
LYMPHS PCT: 6 %
Lymphs Abs: 1.3 10*3/uL (ref 0.7–4.0)
MCH: 29.9 pg (ref 26.0–34.0)
MCHC: 34.1 g/dL (ref 30.0–36.0)
MCV: 87.9 fL (ref 78.0–100.0)
MONOS PCT: 8 %
Monocytes Absolute: 1.7 10*3/uL — ABNORMAL HIGH (ref 0.1–1.0)
NEUTROS ABS: 18.3 10*3/uL — AB (ref 1.7–7.7)
Neutrophils Relative %: 85 %
PLATELETS: 377 10*3/uL (ref 150–400)
RBC: 3.64 MIL/uL — AB (ref 4.22–5.81)
RDW: 12.8 % (ref 11.5–15.5)
WBC: 21.5 10*3/uL — AB (ref 4.0–10.5)

## 2017-02-27 LAB — CULTURE, BLOOD (ROUTINE X 2)
Culture: NO GROWTH
Culture: NO GROWTH
SPECIAL REQUESTS: ADEQUATE
Special Requests: ADEQUATE

## 2017-02-27 LAB — MAGNESIUM: Magnesium: 1.7 mg/dL (ref 1.7–2.4)

## 2017-02-27 MED ORDER — CHLORHEXIDINE GLUCONATE CLOTH 2 % EX PADS: 6.0000 | MEDICATED_PAD | Freq: Once | CUTANEOUS | Status: AC

## 2017-02-27 MED ORDER — CHLORHEXIDINE GLUCONATE CLOTH 2 % EX PADS
6.0000 | MEDICATED_PAD | Freq: Once | CUTANEOUS | Status: AC
  Administered 2017-02-28: 6 via TOPICAL

## 2017-02-27 MED ORDER — POTASSIUM CHLORIDE 10 MEQ/100ML IV SOLN
10.0000 meq | INTRAVENOUS | Status: AC
  Administered 2017-02-27 (×4): 10 meq via INTRAVENOUS
  Filled 2017-02-27 (×4): qty 100

## 2017-02-27 MED ORDER — CHLORHEXIDINE GLUCONATE CLOTH 2 % EX PADS
6.0000 | MEDICATED_PAD | Freq: Once | CUTANEOUS | Status: AC
  Administered 2017-02-27: 6 via TOPICAL

## 2017-02-27 MED ORDER — DEXTROSE 5 % IV SOLN
2.0000 g | INTRAVENOUS | Status: AC
  Administered 2017-02-28: 2 g via INTRAVENOUS
  Filled 2017-02-27: qty 2

## 2017-02-27 MED ORDER — POTASSIUM CHLORIDE CRYS ER 20 MEQ PO TBCR
40.0000 meq | EXTENDED_RELEASE_TABLET | Freq: Every day | ORAL | Status: DC
  Administered 2017-02-27: 40 meq via ORAL
  Filled 2017-02-27: qty 2

## 2017-02-27 MED ORDER — POTASSIUM CHLORIDE CRYS ER 20 MEQ PO TBCR
40.0000 meq | EXTENDED_RELEASE_TABLET | Freq: Once | ORAL | Status: AC
  Administered 2017-02-27: 40 meq via ORAL
  Filled 2017-02-27: qty 2

## 2017-02-27 NOTE — Progress Notes (Signed)
Craig [name] UJW:[id number] DOB: [birth year] DOA: 02/22/2017 PCP: Oval Linsey, MD   Physical Exam: Blood pressure 116/64, pulse 76, temperature 98.3 F (36.8 C), temperature source Oral, resp. rate 20, height  (1.676 m), weight 95.7 kg (210 lb 15.7 oz), SpO2 95 %. Lungs show prolonged expiratory phase scattered rhonchi no rales no wheezes appreciable heart regular rhythm no S3-S4 no heaves thrills rubs abdomen soft bowel sounds normoactive no peristaltic rushes no guarding or rebound no right upper quadrant tenderness   Investigations:  Recent Results (from the past 240 hour(s))  MRSA PCR Screening     Status: None   Collection Time: 02/22/17 10:10 AM  Result Value Ref Range Status   MRSA by PCR NEGATIVE NEGATIVE Final    Comment:        The GeneXpert MRSA Assay (FDA approved for NASAL specimens only), is one component of a comprehensive MRSA colonization surveillance program. It is not intended to diagnose MRSA infection nor to guide or monitor treatment for MRSA infections.   Culture, blood (routine x 2)     Status: None   Collection Time: 02/22/17  2:16 PM  Result Value Ref Range Status   Specimen Description BLOOD LEFT HAND  Final   Special Requests   Final    BOTTLES DRAWN AEROBIC AND ANAEROBIC Blood Culture adequate volume   Culture NO GROWTH 5 DAYS  Final   Report Status 02/27/2017 FINAL  Final  Culture, blood (routine x 2)     Status: None   Collection Time: 02/22/17  2:17 PM  Result Value Ref Range Status   Specimen Description RIGHT ANTECUBITAL  Final   Special Requests   Final    BOTTLES DRAWN AEROBIC AND ANAEROBIC Blood Culture adequate volume   Culture NO GROWTH 5 DAYS  Final   Report Status 02/27/2017 FINAL  Final     Basic Metabolic Panel:  Recent Labs  56/21/30 0552  NA 136  K 3.0*  CL 105  CO2 19*  GLUCOSE 87  BUN 6  CREATININE 0.52*  CALCIUM 8.1*   Liver Function Tests:  Recent Labs  02/25/17 0643 02/26/17 0552  AST 30  32  ALT 57 45  ALKPHOS 83 83  BILITOT 1.1 1.1  PROT 6.1* 5.7*  ALBUMIN 2.7* 2.4*     CBC: No results for input(s): WBC, NEUTROABS, HGB, HCT, MCV, PLT in the last 72 hours.  No results found.    Medications:   Impression:  Active Problems:   Acute gallstone pancreatitis   Elevated liver enzymes     Plan:kcl 40 meq P.O. Today bmet in AM   Consultants: Gastroenterology and surgery   Procedures   Antibiotics: Zosyn          Time spent: 30 minutes   LOS: 5 days   Aldora Perman M   02/27/2017, 12:32 PM

## 2017-02-27 NOTE — Progress Notes (Signed)
Patient has a potassium of 2.7,MD informed and orders given.

## 2017-02-27 NOTE — Progress Notes (Signed)
[facility]    Subjective: No major issues. Feeling good. Tolerating the diet. Labs ordered for later in the day.   Objective: Vital signs in last 24 hours: Temp:  [98.3 F (36.8 C)-98.5 F (36.9 C)] 98.3 F (36.8 C) (09/23 0600) Resp:  [18-20] 20 (09/23 0600) BP: (116-126)/(64-66) 116/64 (09/23 0600) SpO2:  [9 %-96 %] 95 % (09/23 0728) Last BM Date: 02/21/17  Intake/Output from previous day: No intake/output data recorded. Intake/Output this shift: No intake/output data recorded.  General appearance: alert, cooperative, no distress and oriented to self, time, location, president Resp: normal work breathing GI: soft, non-tender, no rebound or guarding Extremities: extremities normal, atraumatic, no cyanosis or edema  Lab Results:  No results for input(s): WBC, HGB, HCT, PLT in the last 72 hours. BMET  Recent Labs  02/26/17 0552  NA 136  K 3.0*  CL 105  CO2 19*  GLUCOSE 87  BUN 6  CREATININE 0.52*  CALCIUM 8.1*    Anti-infectives: Anti-infectives    Start     Dose/Rate Route Frequency Ordered Stop   02/22/17 1315  piperacillin-tazobactam (ZOSYN) IVPB 3.375 g     3.375 g 12.5 mL/hr over 240 Minutes Intravenous Every 8 hours 02/22/17 1302        Assessment/Plan: Craig [name] is a 56 yo with gallstone pancreatitis who has improved greatly. Doing well. Plans for LAPAROSCOPIC CHOLECYSTECTOMY tomorrow.  Labs ordered for later today, will follow up.   PLAN: I counseled the patient about the indication, risks and benefits of laparoscopic cholecystectomy.  He understands there is a very small chance for bleeding, infection, injury to normal structures (including common bile duct), conversion to open surgery, persistent symptoms, evolution of postcholecystectomy diarrhea, need for secondary interventions, anesthesia reaction, cardiopulmonary issues and other risks not specifically detailed here. I described the expected recovery, the plan for follow-up  and the restrictions during the recovery phase.  All questions were answered.  Will discuss with Dr. Janna Arch.   LOS: 5 days    Lucretia Roers 02/27/2017

## 2017-02-28 ENCOUNTER — Inpatient Hospital Stay (HOSPITAL_COMMUNITY): Payer: Medicare Other | Admitting: Anesthesiology

## 2017-02-28 ENCOUNTER — Encounter (HOSPITAL_COMMUNITY): Payer: Self-pay | Admitting: *Deleted

## 2017-02-28 ENCOUNTER — Encounter (HOSPITAL_COMMUNITY)
Admission: EM | Disposition: A | Discharge status: Home / Self Care | Payer: Self-pay | Source: Home / Self Care | Attending: Family Medicine

## 2017-02-28 DIAGNOSIS — K851 Biliary acute pancreatitis without necrosis or infection: Principal | ICD-10-CM

## 2017-02-28 DIAGNOSIS — K811 Chronic cholecystitis: Secondary | ICD-10-CM

## 2017-02-28 HISTORY — PX: CHOLECYSTECTOMY: SHX55

## 2017-02-28 LAB — BASIC METABOLIC PANEL
ANION GAP: 9 (ref 5–15)
BUN: <5 mg/dL — ABNORMAL LOW (ref 6–20)
CALCIUM: 8 mg/dL — AB (ref 8.9–10.3)
CHLORIDE: 101 mmol/L (ref 101–111)
CO2: 26 mmol/L (ref 22–32)
CREATININE: 0.46 mg/dL — AB (ref 0.61–1.24)
Glucose, Bld: 116 mg/dL — ABNORMAL HIGH (ref 65–99)
POTASSIUM: 3.2 mmol/L — AB (ref 3.5–5.1)
SODIUM: 136 mmol/L (ref 135–145)

## 2017-02-28 SURGERY — LAPAROSCOPIC CHOLECYSTECTOMY
Anesthesia: General

## 2017-02-28 MED ORDER — HEMOSTATIC AGENTS (NO CHARGE) OPTIME
TOPICAL | Status: DC | PRN
  Administered 2017-02-28: 1 via TOPICAL

## 2017-02-28 MED ORDER — FENTANYL CITRATE (PF) 100 MCG/2ML IJ SOLN
INTRAMUSCULAR | Status: DC | PRN
  Administered 2017-02-28: 50 ug via INTRAVENOUS
  Administered 2017-02-28: 100 ug via INTRAVENOUS
  Administered 2017-02-28 (×2): 50 ug via INTRAVENOUS

## 2017-02-28 MED ORDER — ROCURONIUM BROMIDE 50 MG/5ML IV SOLN
INTRAVENOUS | Status: AC
  Filled 2017-02-28: qty 1

## 2017-02-28 MED ORDER — GLYCOPYRROLATE 0.2 MG/ML IJ SOLN
INTRAMUSCULAR | Status: DC | PRN
  Administered 2017-02-28: 0.4 mg via INTRAVENOUS

## 2017-02-28 MED ORDER — ROCURONIUM BROMIDE 100 MG/10ML IV SOLN
INTRAVENOUS | Status: DC | PRN
  Administered 2017-02-28 (×2): 20 mg via INTRAVENOUS
  Administered 2017-02-28 (×3): 10 mg via INTRAVENOUS

## 2017-02-28 MED ORDER — NEOSTIGMINE METHYLSULFATE 10 MG/10ML IV SOLN
INTRAVENOUS | Status: DC | PRN
  Administered 2017-02-28: 3 mg via INTRAVENOUS

## 2017-02-28 MED ORDER — FENTANYL CITRATE (PF) 100 MCG/2ML IJ SOLN
25.0000 ug | INTRAMUSCULAR | Status: DC | PRN
  Administered 2017-02-28 (×2): 50 ug via INTRAVENOUS
  Filled 2017-02-28: qty 2

## 2017-02-28 MED ORDER — SUCCINYLCHOLINE CHLORIDE 20 MG/ML IJ SOLN
INTRAMUSCULAR | Status: AC
  Filled 2017-02-28: qty 1

## 2017-02-28 MED ORDER — ONDANSETRON HCL 4 MG/2ML IJ SOLN
INTRAMUSCULAR | Status: AC
  Filled 2017-02-28: qty 2

## 2017-02-28 MED ORDER — SIMETHICONE 80 MG PO CHEW
40.0000 mg | CHEWABLE_TABLET | Freq: Four times a day (QID) | ORAL | Status: DC | PRN
  Administered 2017-03-03: 40 mg via ORAL
  Filled 2017-02-28: qty 1

## 2017-02-28 MED ORDER — ONDANSETRON HCL 4 MG/2ML IJ SOLN
4.0000 mg | Freq: Once | INTRAMUSCULAR | Status: AC
  Administered 2017-02-28: 4 mg via INTRAVENOUS

## 2017-02-28 MED ORDER — DIPHENHYDRAMINE HCL 12.5 MG/5ML PO ELIX: 12.5000 mg | ORAL_SOLUTION | Freq: Four times a day (QID) | ORAL | Status: DC | PRN

## 2017-02-28 MED ORDER — LIDOCAINE HCL (CARDIAC) 20 MG/ML IV SOLN
INTRAVENOUS | Status: DC | PRN
  Administered 2017-02-28: 50 mg via INTRATRACHEAL

## 2017-02-28 MED ORDER — DEXAMETHASONE SODIUM PHOSPHATE 4 MG/ML IJ SOLN
INTRAMUSCULAR | Status: AC
  Filled 2017-02-28: qty 1

## 2017-02-28 MED ORDER — LACTATED RINGERS IV SOLN
INTRAVENOUS | Status: DC | PRN
  Administered 2017-02-28: 08:00:00 via INTRAVENOUS

## 2017-02-28 MED ORDER — PROPOFOL 10 MG/ML IV BOLUS
INTRAVENOUS | Status: DC | PRN
  Administered 2017-02-28: 150 mg via INTRAVENOUS
  Administered 2017-02-28: 50 mg via INTRAVENOUS

## 2017-02-28 MED ORDER — MIDAZOLAM HCL 2 MG/2ML IJ SOLN
INTRAMUSCULAR | Status: AC
  Filled 2017-02-28: qty 2

## 2017-02-28 MED ORDER — CEFOTETAN DISODIUM-DEXTROSE 2-2.08 GM-% IV SOLR
2.0000 g | INTRAVENOUS | Status: DC
  Filled 2017-02-28: qty 50

## 2017-02-28 MED ORDER — SUCCINYLCHOLINE CHLORIDE 200 MG/10ML IV SOSY
PREFILLED_SYRINGE | INTRAVENOUS | Status: DC | PRN
  Administered 2017-02-28: 120 mg via INTRAVENOUS

## 2017-02-28 MED ORDER — GLYCOPYRROLATE 0.2 MG/ML IJ SOLN
0.2000 mg | Freq: Once | INTRAMUSCULAR | Status: AC
  Administered 2017-02-28: 0.2 mg via INTRAVENOUS

## 2017-02-28 MED ORDER — SODIUM CHLORIDE 0.9 % IR SOLN
Status: DC | PRN
  Administered 2017-02-28: 1000 mL

## 2017-02-28 MED ORDER — BUPIVACAINE HCL (PF) 0.5 % IJ SOLN
INTRAMUSCULAR | Status: DC | PRN
  Administered 2017-02-28: 10 mL

## 2017-02-28 MED ORDER — LIDOCAINE HCL 1 % IJ SOLN: INTRAMUSCULAR | Status: DC | PRN

## 2017-02-28 MED ORDER — LIDOCAINE HCL (PF) 1 % IJ SOLN
INTRAMUSCULAR | Status: AC
  Filled 2017-02-28: qty 5

## 2017-02-28 MED ORDER — GLYCOPYRROLATE 0.2 MG/ML IJ SOLN
INTRAMUSCULAR | Status: AC
  Filled 2017-02-28: qty 1

## 2017-02-28 MED ORDER — NEOSTIGMINE METHYLSULFATE 10 MG/10ML IV SOLN
INTRAVENOUS | Status: AC
  Filled 2017-02-28: qty 1

## 2017-02-28 MED ORDER — GLYCOPYRROLATE 0.2 MG/ML IJ SOLN
INTRAMUSCULAR | Status: AC
  Filled 2017-02-28: qty 2

## 2017-02-28 MED ORDER — POTASSIUM CHLORIDE 10 MEQ/100ML IV SOLN
10.0000 meq | INTRAVENOUS | Status: AC
  Administered 2017-02-28 (×4): 10 meq via INTRAVENOUS
  Filled 2017-02-28 (×4): qty 100

## 2017-02-28 MED ORDER — LACTATED RINGERS IV SOLN
INTRAVENOUS | Status: DC
  Administered 2017-02-28: 08:00:00 via INTRAVENOUS

## 2017-02-28 MED ORDER — DIPHENHYDRAMINE HCL 50 MG/ML IJ SOLN: 12.5000 mg | Freq: Four times a day (QID) | INTRAMUSCULAR | Status: DC | PRN

## 2017-02-28 MED ORDER — PROPOFOL 10 MG/ML IV BOLUS
INTRAVENOUS | Status: AC
  Filled 2017-02-28: qty 20

## 2017-02-28 MED ORDER — BUPIVACAINE HCL (PF) 0.5 % IJ SOLN
INTRAMUSCULAR | Status: AC
  Filled 2017-02-28: qty 30

## 2017-02-28 MED ORDER — FENTANYL CITRATE (PF) 250 MCG/5ML IJ SOLN
INTRAMUSCULAR | Status: AC
  Filled 2017-02-28: qty 5

## 2017-02-28 MED ORDER — MIDAZOLAM HCL 2 MG/2ML IJ SOLN
1.0000 mg | INTRAMUSCULAR | Status: AC
  Administered 2017-02-28 (×2): 2 mg via INTRAVENOUS
  Filled 2017-02-28: qty 2

## 2017-02-28 SURGICAL SUPPLY — 51 items
ADH SKN CLS APL DERMABOND .7 (GAUZE/BANDAGES/DRESSINGS) ×1
APPLIER CLIP LAPSCP 10X32 DD (CLIP) ×3 IMPLANT
BAG HAMPER (MISCELLANEOUS) ×3 IMPLANT
BAG RETRIEVAL 10 (BASKET) ×1
BAG RETRIEVAL 10MM (BASKET) ×1
CHLORAPREP W/TINT 26ML (MISCELLANEOUS) ×3 IMPLANT
CLOTH BEACON ORANGE TIMEOUT ST (SAFETY) ×3 IMPLANT
COVER LIGHT HANDLE STERIS (MISCELLANEOUS) ×6 IMPLANT
DECANTER SPIKE VIAL GLASS SM (MISCELLANEOUS) ×4 IMPLANT
DERMABOND ADVANCED (GAUZE/BANDAGES/DRESSINGS) ×2
DERMABOND ADVANCED .7 DNX12 (GAUZE/BANDAGES/DRESSINGS) ×1 IMPLANT
DEVICE TROCAR PUNCTURE CLOSURE (ENDOMECHANICALS) ×3 IMPLANT
ELECT REM PT RETURN 9FT ADLT (ELECTROSURGICAL) ×3
ELECTRODE REM PT RTRN 9FT ADLT (ELECTROSURGICAL) ×1 IMPLANT
FILTER SMOKE EVAC LAPAROSHD (FILTER) ×3 IMPLANT
FORMALIN 10 PREFIL 480ML (MISCELLANEOUS) ×2 IMPLANT
GLOVE BIO SURGEON STRL SZ 6.5 (GLOVE) ×3 IMPLANT
GLOVE BIO SURGEONS STRL SZ 6.5 (GLOVE) ×2
GLOVE BIOGEL PI IND STRL 6.5 (GLOVE) ×1 IMPLANT
GLOVE BIOGEL PI IND STRL 7.0 (GLOVE) ×1 IMPLANT
GLOVE BIOGEL PI IND STRL 7.5 (GLOVE) IMPLANT
GLOVE BIOGEL PI INDICATOR 6.5 (GLOVE) ×4
GLOVE BIOGEL PI INDICATOR 7.0 (GLOVE) ×2
GLOVE BIOGEL PI INDICATOR 7.5 (GLOVE) ×2
GLOVE ECLIPSE 6.5 STRL STRAW (GLOVE) ×2 IMPLANT
GLOVE SURG SS PI 7.5 STRL IVOR (GLOVE) ×2 IMPLANT
GOWN STRL REUS W/TWL LRG LVL3 (GOWN DISPOSABLE) ×9 IMPLANT
HEMOSTAT SNOW SURGICEL 2X4 (HEMOSTASIS) ×3 IMPLANT
INST SET LAPROSCOPIC AP (KITS) ×3 IMPLANT
IV NS IRRIG 3000ML ARTHROMATIC (IV SOLUTION) IMPLANT
KIT ROOM TURNOVER APOR (KITS) ×3 IMPLANT
MANIFOLD NEPTUNE II (INSTRUMENTS) ×3 IMPLANT
NDL INSUFFLATION 14GA 120MM (NEEDLE) ×1 IMPLANT
NEEDLE INSUFFLATION 14GA 120MM (NEEDLE) ×3 IMPLANT
NS IRRIG 1000ML POUR BTL (IV SOLUTION) ×3 IMPLANT
PACK LAP CHOLE LZT030E (CUSTOM PROCEDURE TRAY) ×3 IMPLANT
PAD ARMBOARD 7.5X6 YLW CONV (MISCELLANEOUS) ×3 IMPLANT
SET BASIN LINEN APH (SET/KITS/TRAYS/PACK) ×3 IMPLANT
SET TUBE IRRIG SUCTION NO TIP (IRRIGATION / IRRIGATOR) IMPLANT
SLEEVE ENDOPATH XCEL 5M (ENDOMECHANICALS) ×6 IMPLANT
SUT MNCRL AB 4-0 PS2 18 (SUTURE) ×5 IMPLANT
SUT VICRYL 0 UR6 27IN ABS (SUTURE) ×5 IMPLANT
SUT VICRYL AB 3-0 FS1 BRD 27IN (SUTURE) ×1 IMPLANT
SYS BAG RETRIEVAL 10MM (BASKET) ×1
SYSTEM BAG RETRIEVAL 10MM (BASKET) ×1 IMPLANT
TROCAR XCEL NON-BLD 11X100MML (ENDOMECHANICALS) ×3 IMPLANT
TROCAR XCEL NON-BLD 5MMX100MML (ENDOMECHANICALS) ×3 IMPLANT
TUBE CONNECTING 12'X1/4 (SUCTIONS)
TUBE CONNECTING 12X1/4 (SUCTIONS) IMPLANT
TUBING INSUFFLATION (TUBING) ×3 IMPLANT
WARMER LAPAROSCOPE (MISCELLANEOUS) ×3 IMPLANT

## 2017-02-28 NOTE — H&P (Signed)
I have evaluated Shirlean Kelly preoperatively, and have identified no interval change in the medical condition or plan of care since my last progress note 02/27/2017.   Algis Greenhouse, MD [facility] [address] [place], [place] [address] [phone] (office)

## 2017-02-28 NOTE — Anesthesia Preprocedure Evaluation (Signed)
Anesthesia Evaluation  Patient identified by MRN, date of birth, ID band Patient awake    Reviewed: Allergy & Precautions, NPO status , Patient's Chart, lab work & pertinent test results  Airway Mallampati: II  TM Distance: >3 FB     Dental  (+) Edentulous Upper, Partial Lower   Pulmonary COPD, former smoker,    breath sounds clear to auscultation       Cardiovascular negative cardio ROS   Rhythm:Regular Rate:Normal     Neuro/Psych PSYCHIATRIC DISORDERS Bipolar Disorder CVA, Residual Symptoms    GI/Hepatic Neg liver ROS, GERD  Controlled,  Endo/Other  negative endocrine ROS  Renal/GU negative Renal ROS     Musculoskeletal   Abdominal   Peds  Hematology   Anesthesia Other Findings   Reproductive/Obstetrics                             Anesthesia Physical Anesthesia Plan  ASA: III  Anesthesia Plan: General   Post-op Pain Management:    Induction: Intravenous, Rapid sequence and Cricoid pressure planned  PONV Risk Score and Plan:   Airway Management Planned: Oral ETT  Additional Equipment:   Intra-op Plan:   Post-operative Plan: Extubation in OR  Informed Consent: I have reviewed the patients History and Physical, chart, labs and discussed the procedure including the risks, benefits and alternatives for the proposed anesthesia with the patient or authorized representative who has indicated his/her understanding and acceptance.     Plan Discussed with:   Anesthesia Plan Comments:         Anesthesia Quick Evaluation

## 2017-02-28 NOTE — Transfer of Care (Signed)
Immediate Anesthesia Transfer of Care Note  Patient: Craig [name]  Procedure(s) Performed: Procedure(s): LAPAROSCOPIC CHOLECYSTECTOMY, possible open (N/A)  Patient Location: PACU  Anesthesia Type:General  Level of Consciousness: awake, alert , oriented and patient cooperative  Airway & Oxygen Therapy: Patient Spontanous Breathing  Post-op Assessment: Report given to RN and Post -op Vital signs reviewed and stable  Post vital signs: Reviewed and stable  Last Vitals:  Vitals:   02/28/17 0920 02/28/17 1101  BP:  130/78  Pulse:  88  Resp: (!) 25 (!) 22  Temp:  (P) 36.6 C  SpO2: 93% 96%    Last Pain:  Vitals:   02/28/17 0600  TempSrc: Oral  PainSc:       Patients Stated Pain Goal: 2 (02/28/17 0514)  Complications: No apparent anesthesia complications

## 2017-02-28 NOTE — Progress Notes (Signed)
Saw in PACU. Looks comfortable.   BP 124/73 (BP Location: Left Arm)   Pulse 80   Temp 97.8 F (36.6 C)   Resp 15   Ht  (1.676 m)   Wt 210 lb 15.7 oz (95.7 kg)   SpO2 94%   BMI 34.05 kg/m   Can have diet. Pain meds already ordered.   Algis Greenhouse, MD [facility] [address] [place] [place], [place] [address] [phone] (office)

## 2017-02-28 NOTE — Anesthesia Postprocedure Evaluation (Signed)
Anesthesia Post Note  Patient: Craig [name]  Procedure(s) Performed: Procedure(s) (LRB): LAPAROSCOPIC CHOLECYSTECTOMY, possible open (N/A)  Patient location during evaluation: PACU Anesthesia Type: General Level of consciousness: awake Pain control: Reports 8/10. down from 10/10. Vital Signs Assessment: post-procedure vital signs reviewed and stable Respiratory status: spontaneous breathing and patient connected to nasal cannula oxygen Cardiovascular status: stable Postop Assessment: no apparent nausea or vomiting Anesthetic complications: no     Last Vitals:  Vitals:   02/28/17 1130 02/28/17 1145  BP: 119/68 124/73  Pulse: 80 80  Resp: 19 15  Temp:    SpO2: 94% 94%    Last Pain:  Vitals:   02/28/17 1145  TempSrc:   PainSc: 8                  Lynnwood Beckford

## 2017-02-28 NOTE — Op Note (Signed)
Operative Note 02/28/17   Preoperative Diagnosis: Gallstone Pancreatitis    Postoperative Diagnosis: Gallstone Pancreatitis; Chronic cholecystitis    Procedure(s) Performed: Laparoscopic cholecystectomy   Surgeon: Leatrice Jewels. Henreitta Leber, MD   Assistants: Franky Macho, MD    Anesthesia: General endotracheal   Anesthesiologist: Laurene Footman, MD    Specimens: Gallbladder    Estimated Blood Loss: Minimal    Blood Replacement: None    Complications: None    Operative Findings: Enlarged and distended gallbladder with chronic inflammatory changes.    Procedure: The patient was taken to the operating room and placed supine. General endotracheal anesthesia was induced. Intravenous antibiotics were administered per protocol. An orogastric tube positioned to decompress the stomach. The abdomen was prepared and draped in the usual sterile fashion.    A supraumbilical incision was made and towel clip was used to elevate the umbilicus.  A Veress technique through this incision was utilized to achieve pneumoperitoneum to 15 mmHg with carbon dioxide. A 10 mm optiview port was placed through the supraumbilical region, and a 10 mm 0-degree operative laparoscope was introduced. The area underlying the trocar was inspected and there was no evidence of injury.  Remaining trocars were placed under direct vision. Two 5 mm ports were placed in the right abdomen, between the anterior axillary and midclavicular line.  A final 10 mm port was placed through the mid-epigastrium, near the falciform ligament.    The gallbladder fundus was elevated cephalad and the infundibulum was retracted to the patient's right.  The gallbladder had some chronic inflammatory changes and was distended.  The gallbladder was decompressed using suction to enable better retraction.  The gallbladder was very long, and the lateral and medial edge of the gallbladder were skeletonized off the hepatic plate using dynamic retraction to better  facilitate exposure of the infundibulum.  After this was performed the, the gallbladder/cystic duct junction was skeletonized. The cystic artery noted in the triangle of Calot and was also skeletonized.   We then continued liberal medial and lateral dissection until the critical view of safety was achieved.   In attempts to clear the cystic duct further a small cystotomy as made with minimal spillage of bile.  The cystic artery was then triply clipped and divided to get better exposure of the cystic duct.  Once the artery was ligated the duct was cleared further and triply clipped below the cystotomy and divided.  The remainder of the gallbladder was then dissected from the liver bed with electrocautery. A possible posterior branch of the cystic artery was noted and clipped at the edge of the gallbladder.  The specimen was placed in an Endopouch and was retrieved through the epigastric site.   Final inspection revealed acceptable hemostasis. Surgical Snow hemostatic was placed in the bed. A closure device was utilized to place 0 Vicryl fascial sutures at the epigastric port site. Trocars were removed under direct vision and pneumoperitoneum was released. The umbilical site was closed with a 0 vicryl suture. Skin incisions were closed with 4-0 Monocryl subcuticular sutures and Dermabond. The patient was awakened from anesthesia and extubated without complication.   All counts were correct at the end of the case.    Algis Greenhouse, MD [facility] [address] [place], [place] [address] [phone] (office)

## 2017-02-28 NOTE — Anesthesia Postprocedure Evaluation (Signed)
Anesthesia Post Note  Patient: Shirlean Kelly  Procedure(s) Performed: Procedure(s) (LRB): LAPAROSCOPIC CHOLECYSTECTOMY, possible open (N/A)  Patient location during evaluation: PACU Anesthesia Type: General Level of consciousness: awake and alert, oriented and patient cooperative Pain management: pain level controlled Vital Signs Assessment: post-procedure vital signs reviewed and stable Respiratory status: spontaneous breathing Cardiovascular status: blood pressure returned to baseline and stable Postop Assessment: no headache, no apparent nausea or vomiting and no backache Anesthetic complications: no     Last Vitals:  Vitals:   02/28/17 0920 02/28/17 1101  BP:  130/78  Pulse:  88  Resp: (!) 25 (!) 22  Temp:  (P) 36.6 C  SpO2: 93% 96%    Last Pain:  Vitals:   02/28/17 0600  TempSrc: Oral  PainSc:                  Nanako Stopher

## 2017-02-28 NOTE — Progress Notes (Signed)
Patient is status post laparoscopic cholecystectomy potassium 3.2 we'll give IV potassium KCl 10 mEq 4 continue clear liquid diet BLADIMIR AUMAN GGE:[id number] DOB: Jun 10, 1960 DOA: 02/22/2017 PCP: Lucia Gaskins, MD   Physical Exam: Blood pressure 124/71, pulse 71, temperature 98.1 F (36.7 C), temperature source Oral, resp. rate 18, height '5\' 6"'  (1.676 m), weight 95.7 kg (210 lb 15.7 oz), SpO2 95 %. Lungs show prolonged expiratory phase scattered rhonchi no rales no wheezes noted heart regular rhythm no S3-S4 no heaves thrills rubs abdomen bowel sounds normoactive no guarding or rebound   Investigations:  Recent Results (from the past 240 hour(s))  MRSA PCR Screening     Status: None   Collection Time: 02/22/17 10:10 AM  Result Value Ref Range Status   MRSA by PCR NEGATIVE NEGATIVE Final    Comment:        The GeneXpert MRSA Assay (FDA approved for NASAL specimens only), is one component of a comprehensive MRSA colonization surveillance program. It is not intended to diagnose MRSA infection nor to guide or monitor treatment for MRSA infections.   Culture, blood (routine x 2)     Status: None   Collection Time: 02/22/17  2:16 PM  Result Value Ref Range Status   Specimen Description BLOOD LEFT HAND  Final   Special Requests   Final    BOTTLES DRAWN AEROBIC AND ANAEROBIC Blood Culture adequate volume   Culture NO GROWTH 5 DAYS  Final   Report Status 02/27/2017 FINAL  Final  Culture, blood (routine x 2)     Status: None   Collection Time: 02/22/17  2:17 PM  Result Value Ref Range Status   Specimen Description RIGHT ANTECUBITAL  Final   Special Requests   Final    BOTTLES DRAWN AEROBIC AND ANAEROBIC Blood Culture adequate volume   Culture NO GROWTH 5 DAYS  Final   Report Status 02/27/2017 FINAL  Final     Basic Metabolic Panel:  Recent Labs  02/27/17 1234 02/28/17 0552  NA 134* 136  K 2.7* 3.2*  CL 102 101  CO2 21* 26  GLUCOSE 128* 116*  BUN <5* <5*   CREATININE 0.47* 0.46*  CALCIUM 8.0* 8.0*  MG 1.7  --    Liver Function Tests:  Recent Labs  02/26/17 0552 02/27/17 1234  AST 32 32  ALT 45 40  ALKPHOS 83 86  BILITOT 1.1 0.7  PROT 5.7* 5.6*  ALBUMIN 2.4* 2.3*     CBC:  Recent Labs  02/27/17 1234  WBC 21.5*  NEUTROABS 18.3*  HGB 10.9*  HCT 32.0*  MCV 87.9  PLT 377    No results found.    Medications:  Impression:  Principal Problem:   Acute gallstone pancreatitis Active Problems:   Elevated liver enzymes   Chronic cholecystitis     Plan: Continue clear liquid diet. KCl 10 mEq IV times for a met in a.m.  Consultants: Gastroenterology and general surgery   Procedures status post laparoscopic cholecystectomy   Antibiotics: Zosyn IV           Time spent: 30 minutes   LOS: 6 days   Linday Rhodes M   02/28/2017, 2:27 PM

## 2017-02-28 NOTE — Addendum Note (Signed)
Addendum  created 02/28/17 1204 by Franco Nones, CRNA   Sign clinical note

## 2017-03-01 ENCOUNTER — Encounter (HOSPITAL_COMMUNITY): Payer: Self-pay | Admitting: General Surgery

## 2017-03-01 LAB — BASIC METABOLIC PANEL
ANION GAP: 9 (ref 5–15)
BUN: 6 mg/dL (ref 6–20)
CALCIUM: 8.1 mg/dL — AB (ref 8.9–10.3)
CO2: 25 mmol/L (ref 22–32)
Chloride: 106 mmol/L (ref 101–111)
Creatinine, Ser: 0.57 mg/dL — ABNORMAL LOW (ref 0.61–1.24)
GFR calc Af Amer: >60 mL/min (ref >60)
GFR calc non Af Amer: >60 mL/min (ref >60)
GLUCOSE: 118 mg/dL — AB (ref 65–99)
Potassium: 3.7 mmol/L (ref 3.5–5.1)
Sodium: 140 mmol/L (ref 135–145)

## 2017-03-01 MED ORDER — OXYCODONE HCL 5 MG PO TABS
5.0000 mg | ORAL_TABLET | Freq: Four times a day (QID) | ORAL | Status: DC | PRN
  Administered 2017-03-01 – 2017-03-03 (×8): 5 mg via ORAL
  Filled 2017-03-01 (×8): qty 1

## 2017-03-01 NOTE — Progress Notes (Signed)
Patient tolerating food with no postprandial exacerbation of abdominal discomfort just incisional pain we'll change from IV to by mouth opioid analgesia. Electrolytes fine Craig [name] MRN:[id number] DOB: 04/19/1961 DOA: 02/22/2017 PCP: , , MD   Physical Exam: Blood pressure 112/63, pulse 65, temperature 98.5 F (36.9 C), temperature source Oral, resp. rate 16, height 5' 6" (1.676 m), weight 95.7 kg (210 lb 15.7 oz), SpO2 95 %. Lungs clear to A&P no rales wheeze rhonchi heart regular rhythm no S3-S4 no heaves thrills rubs abdomen soft bowel sounds normoactive no guarding or rebound   Investigations:  Recent Results (from the past 240 hour(s))  MRSA PCR Screening     Status: None   Collection Time: 02/22/17 10:10 AM  Result Value Ref Range Status   MRSA by PCR NEGATIVE NEGATIVE Final    Comment:        The GeneXpert MRSA Assay (FDA approved for NASAL specimens only), is one component of a comprehensive MRSA colonization surveillance program. It is not intended to diagnose MRSA infection nor to guide or monitor treatment for MRSA infections.   Culture, blood (routine x 2)     Status: None   Collection Time: 02/22/17  2:16 PM  Result Value Ref Range Status   Specimen Description BLOOD LEFT HAND  Final   Special Requests   Final    BOTTLES DRAWN AEROBIC AND ANAEROBIC Blood Culture adequate volume   Culture NO GROWTH 5 DAYS  Final   Report Status 02/27/2017 FINAL  Final  Culture, blood (routine x 2)     Status: None   Collection Time: 02/22/17  2:17 PM  Result Value Ref Range Status   Specimen Description RIGHT ANTECUBITAL  Final   Special Requests   Final    BOTTLES DRAWN AEROBIC AND ANAEROBIC Blood Culture adequate volume   Culture NO GROWTH 5 DAYS  Final   Report Status 02/27/2017 FINAL  Final     Basic Metabolic Panel:  Recent Labs  02/27/17 1234 02/28/17 0552 03/01/17 0518  NA 134* 136 140  K 2.7* 3.2* 3.7  CL 102 101 106  CO2 21* 26 25   GLUCOSE 128* 116* 118*  BUN <5* <5* 6  CREATININE 0.47* 0.46* 0.57*  CALCIUM 8.0* 8.0* 8.1*  MG 1.7  --   --    Liver Function Tests:  Recent Labs  02/27/17 1234  AST 32  ALT 40  ALKPHOS 86  BILITOT 0.7  PROT 5.6*  ALBUMIN 2.3*     CBC:  Recent Labs  02/27/17 1234  WBC 21.5*  NEUTROABS 18.3*  HGB 10.9*  HCT 32.0*  MCV 87.9  PLT 377    No results found.    Medications: Impression:  Principal Problem:   Acute gallstone pancreatitis Active Problems:   Elevated liver enzymes   Chronic cholecystitis     Plan: Observe response to opioid oral analgesia be met in a.m.  Consultants: Gen. surgery and gastroenterology   Procedures status post lap cholecystectomy   Antibiotics:        Time spent: 30 minutes   LOS: 7 days   , M   03/01/2017, 12:33 PM    

## 2017-03-01 NOTE — Progress Notes (Signed)
Pharmacy Antibiotic Note  Craig [name] is a 56 y.o. male admitted on 02/22/2017 with intra-abdominal infection.  Pharmacy has been consulted for ZOSYN dosing.  S/p cholecystectomy 9/24, gallstone pancreatitis   Plan: Continue Zosyn 3.375gm IV q8h, EID Monitor labs, progress, c/s  Height:  (167.6 cm) Weight: 210 lb 15.7 oz (95.7 kg) IBW/kg (Calculated) : 63.8  Temp (24hrs), Avg:98 F (36.7 C), Min:97.5 F (36.4 C), Max:98.5 F (36.9 C)   Recent Labs Lab 02/23/17 0615 02/24/17 0640 02/26/17 0552 02/27/17 1234 02/28/17 0552 03/01/17 0518  WBC 26.7* 22.4*  --  21.5*  --   --   CREATININE 0.70 0.65 0.52* 0.47* 0.46* 0.57*    Estimated Creatinine Clearance: 113 mL/min (A) (by C-G formula based on SCr of 0.57 mg/dL (L)).   No Known Allergies  Antimicrobials this admission: Zosyn 9/18 >>   Microbiology results:  MRSA PCR: (-)  Thank you for allowing pharmacy to be a part of this patient's care.  Mady Gemma 03/01/2017 8:38 AM

## 2017-03-01 NOTE — Progress Notes (Addendum)
[facility] 1 Day Post-Op  Subjective: Tolerating clears. No nausea. Pain doing fair.   Objective: Vital signs in last 24 hours: Temp:  [97.5 F (36.4 C)-98.5 F (36.9 C)] 98.5 F (36.9 C) (09/25 0604) Pulse Rate:  [65-88] 65 (09/25 0604) Resp:  [15-22] 16 (09/25 0604) BP: (112-130)/(63-78) 112/63 (09/25 0604) SpO2:  [93 %-100 %] 95 % (09/25 0604) Last BM Date: 02/21/17  Intake/Output from previous day: 09/24 0701 - 09/25 0700 In: 5568.8 [P.O.:720; I.V.:4248.8; IV Piggyback:600] Out: 3740 [Urine:3700; Blood:40] Intake/Output this shift: No intake/output data recorded.  General appearance: alert, cooperative and no distress Resp: normal work of breathing GI: soft, appropriately tender around port site, port sites c/d/i with dermabond, no erythema or drainage  Lab Results:   Recent Labs  02/27/17 1234  WBC 21.5*  HGB 10.9*  HCT 32.0*  PLT 377   BMET  Recent Labs  02/28/17 0552 03/01/17 0518  NA 136 140  K 3.2* 3.7  CL 101 106  CO2 26 25  GLUCOSE 116* 118*  BUN <5* 6  CREATININE 0.46* 0.57*  CALCIUM 8.0* 8.1*   PT/INR No results for input(s): LABPROT, INR in the last 72 hours.  Studies/Results: No results found.  Anti-infectives: Anti-infectives    Start     Dose/Rate Route Frequency Ordered Stop   02/28/17 0756  cefoTEtan in Dextrose 5% (CEFOTAN) IVPB 2 g  Status:  Discontinued     2 g Intravenous On call to O.R. 02/28/17 0757 02/28/17 1217   02/28/17 0600  cefoTEtan (CEFOTAN) 2 g in dextrose 5 % 50 mL IVPB     2 g 100 mL/hr over 30 Minutes Intravenous On call to O.R. 02/27/17 1228 02/28/17 0928   02/22/17 1315  piperacillin-tazobactam (ZOSYN) IVPB 3.375 g     3.375 g 12.5 mL/hr over 240 Minutes Intravenous Every 8 hours 02/22/17 1302        Assessment/Plan: Craig [name] is a 56 yo with gallstone pancreatitis, chronic cholecystitis s/p laparoscopic cholecystectomy. He is doing well. Is on IV pain medication. Once tolerating  diet and on Oral pain medication can d/c home from a surgical standpoint. -Would recommend taking off the IV and using more oral pain meds  -Adv diet as tolerated  -Will need follow up in 2 weeks with me or with another provider where he is located    LOS: 7 days    Craig [name] 03/01/2017

## 2017-03-01 NOTE — Addendum Note (Signed)
Addendum  created 03/01/17 1417 by Moshe Salisbury, CRNA   Sign clinical note

## 2017-03-01 NOTE — Anesthesia Postprocedure Evaluation (Signed)
Anesthesia Post Note  Patient: Craig [name]  Procedure(s) Performed: Procedure(s) (LRB): LAPAROSCOPIC CHOLECYSTECTOMY, possible open (N/A)  Patient location during evaluation: Nursing Unit Anesthesia Type: General Level of consciousness: awake and alert and oriented Pain management: pain level controlled Vital Signs Assessment: post-procedure vital signs reviewed and stable Respiratory status: spontaneous breathing Cardiovascular status: blood pressure returned to baseline Postop Assessment: no apparent nausea or vomiting Anesthetic complications: no     Last Vitals:  Vitals:   03/01/17 0320 03/01/17 0604  BP: 114/69 112/63  Pulse: 70 65  Resp: 16 16  Temp: 36.8 C 36.9 C  SpO2: 97% 95%    Last Pain:  Vitals:   03/01/17 0850  TempSrc:   PainSc: 10-Worst pain ever                 [facility]

## 2017-03-01 NOTE — Care Management Note (Signed)
Case Management Note  Patient Details  Name: BARNARD SHARPS MRN: [id number] Date of Birth: 05-Dec-1960  If discussed at Long Length of Stay Meetings, dates discussed:  03/01/2017   Malcolm Metro, RN 03/01/2017, 1:20 PM

## 2017-03-02 ENCOUNTER — Inpatient Hospital Stay (HOSPITAL_COMMUNITY): Payer: Medicare Other

## 2017-03-02 ENCOUNTER — Encounter (HOSPITAL_COMMUNITY): Payer: Self-pay | Admitting: Internal Medicine

## 2017-03-02 LAB — HEPATIC FUNCTION PANEL
ALT: 32 U/L (ref 17–63)
AST: 31 U/L (ref 15–41)
Albumin: 2.2 g/dL — ABNORMAL LOW (ref 3.5–5.0)
Alkaline Phosphatase: 68 U/L (ref 38–126)
Bilirubin, Direct: 0.1 mg/dL (ref 0.1–0.5)
Indirect Bilirubin: 0.8 mg/dL (ref 0.3–0.9)
Total Bilirubin: 0.9 mg/dL (ref 0.3–1.2)
Total Protein: 5.5 g/dL — ABNORMAL LOW (ref 6.5–8.1)

## 2017-03-02 LAB — BASIC METABOLIC PANEL WITH GFR
Anion gap: 10 (ref 5–15)
BUN: 5 mg/dL — ABNORMAL LOW (ref 6–20)
CO2: 28 mmol/L (ref 22–32)
Calcium: 7.9 mg/dL — ABNORMAL LOW (ref 8.9–10.3)
Chloride: 101 mmol/L (ref 101–111)
Creatinine, Ser: 0.35 mg/dL — ABNORMAL LOW (ref 0.61–1.24)
GFR calc Af Amer: >60 mL/min
GFR calc non Af Amer: >60 mL/min
Glucose, Bld: 116 mg/dL — ABNORMAL HIGH (ref 65–99)
Potassium: 2.8 mmol/L — ABNORMAL LOW (ref 3.5–5.1)
Sodium: 139 mmol/L (ref 135–145)

## 2017-03-02 MED ORDER — POTASSIUM CHLORIDE 10 MEQ/100ML IV SOLN
10.0000 meq | INTRAVENOUS | Status: AC
  Administered 2017-03-02 – 2017-03-03 (×4): 10 meq via INTRAVENOUS
  Filled 2017-03-02 (×4): qty 100

## 2017-03-02 MED ORDER — POTASSIUM CHLORIDE 10 MEQ/100ML IV SOLN: 10.0000 meq | INTRAVENOUS | Status: AC

## 2017-03-02 MED ORDER — POTASSIUM CHLORIDE CRYS ER 20 MEQ PO TBCR
20.0000 meq | EXTENDED_RELEASE_TABLET | Freq: Two times a day (BID) | ORAL | Status: DC
  Administered 2017-03-02 – 2017-03-03 (×3): 20 meq via ORAL
  Filled 2017-03-02 (×3): qty 1

## 2017-03-02 NOTE — Evaluation (Signed)
Physical Therapy Evaluation Patient Details Name: Craig [name] MRN: [id number] DOB: [birth year] Today's Date: 03/02/2017   History of Present Illness  Craig [name] is a 56 y.o. male with known bipolar disorder, PTSD, COPD, presented to [facility] emergency department with main concern of progressively worsening generalized abdominal pain, 3 days in duration. Patient reports pain initially intermittent and started in epigastric area however, progressed to constant and sharp pain in the past 24 hours, associated with nausea and nonbloody vomiting, inability to keep food down. Patient reports pain is occasionally radiating to the back and he denies any specific alleviating factors. Patient denies chest pain or shortness of breath, no recent similar episodes.   Clinical Impression  Patient c/o mild dizziness upon sitting up at bedside that resolved before attempting sit to stands, limited for gait secondary to fatigue and decreased balance (see below).  Patient will benefit from continued physical therapy in hospital and recommended venue below to increase strength, balance, endurance for safe ADLs and gait.    Follow Up Recommendations Home health PT;Supervision/Assistance - 24 hour    Equipment Recommendations  None recommended by PT    Recommendations for Other Services       Precautions / Restrictions Precautions Precautions: Fall Restrictions Weight Bearing Restrictions: No      Mobility  Bed Mobility Overal bed mobility: Needs Assistance Bed Mobility: Supine to Sit;Sit to Supine     Supine to sit: Min assist Sit to supine: Min assist      Transfers Overall transfer level: Needs assistance Equipment used: Rolling walker (2 wheeled) Transfers: Sit to/from [employer] Sit to Stand: Min assist Stand pivot transfers: Min assist          Ambulation/Gait Ambulation/Gait assistance: Min assist Ambulation Distance (Feet): 20 Feet Assistive device:  Rolling walker (2 wheeled) Gait Pattern/deviations: Decreased step length - right;Decreased step length - left;Decreased stride length   Gait velocity interpretation: Below normal speed for age/gender General Gait Details: Patient demonstrates slow labored cadence with wide base of support, limited secondary to c/o fatigue  Stairs            Wheelchair Mobility    Modified Rankin (Stroke Patients Only)       Balance Overall balance assessment: Needs assistance Sitting-balance support: Feet supported;No upper extremity supported Sitting balance-Leahy Scale: Good     Standing balance support: Bilateral upper extremity supported;During functional activity Standing balance-Leahy Scale: Fair                               Pertinent Vitals/Pain Pain Assessment: 0-10 Pain Score: 5  Pain Location: stomach Pain Descriptors / Indicators: Aching;Nagging Pain Intervention(s): Limited activity within patient's tolerance;Monitored during session    Home Living Family/patient expects to be discharged to:: [facility]                 Additional Comments: Patient states ambulates without assistive device, climb up stairs with 1 side rail    Prior Function Level of Independence: Independent               Hand Dominance        Extremity/Trunk Assessment   Upper Extremity Assessment Upper Extremity Assessment: Generalized weakness    Lower Extremity Assessment Lower Extremity Assessment: Generalized weakness    Cervical / Trunk Assessment Cervical / Trunk Assessment: Kyphotic  Communication   Communication: No difficulties  Cognition Arousal/Alertness: Awake/alert Behavior During Therapy: WFL for tasks  assessed/performed Overall Cognitive Status: Within Functional Limits for tasks assessed                                        General Comments      Exercises     Assessment/Plan    PT Assessment Patient needs  continued PT services  PT Problem List Decreased strength;Decreased activity tolerance;Decreased balance;Decreased mobility       PT Treatment Interventions Gait training;Stair training;Functional mobility training;Therapeutic activities;Therapeutic exercise;Patient/family education    PT Goals (Current goals can be found in the Care Plan section)  Acute Rehab PT Goals Patient Stated Goal: walk again PT Goal Formulation: With patient Time For Goal Achievement: 03/09/17 Potential to Achieve Goals: Good    Frequency Min 3X/week   Barriers to discharge        Co-evaluation               AM-PAC PT "6 Clicks" Daily Activity  Outcome Measure Difficulty turning over in bed (including adjusting bedclothes, sheets and blankets)?: Unable Difficulty moving from lying on back to sitting on the side of the bed? : Unable Difficulty sitting down on and standing up from a chair with arms (e.g., wheelchair, bedside commode, etc,.)?: Unable Help needed moving to and from a bed to chair (including a wheelchair)?: A Little Help needed walking in hospital room?: A Little Help needed climbing 3-5 steps with a railing? : A Lot 6 Click Score: 11    End of Session   Activity Tolerance: Patient tolerated treatment well;Patient limited by fatigue Patient left: in chair;with call bell/phone within reach (with police present) Nurse Communication: Mobility status PT Visit Diagnosis: Unsteadiness on feet (R26.81);Other abnormalities of gait and mobility (R26.89);Muscle weakness (generalized) (M62.81)    Time: 0981-1914 PT Time Calculation (min) (ACUTE ONLY): 37 min   Charges:   PT Evaluation $PT Eval Low Complexity: 1 Low PT Treatments $Therapeutic Activity: 23-37 mins   PT G Codes:        11:06 AM, 2017-03-21 Ocie Bob, MPT Physical Therapist with [facility] [phone] office [phone] mobile phone

## 2017-03-02 NOTE — Progress Notes (Signed)
[facility] 2 Days Post-Op  Subjective: Reports vomiting this AM. Walked in the hall. Pain ok but mostly at the umbilical site.   Objective: Vital signs in last 24 hours: Temp:  [97.8 F (36.6 C)-98.4 F (36.9 C)] 98.4 F (36.9 C) (09/26 0645) Pulse Rate:  [57-69] 69 (09/26 0645) Resp:  [16-18] 18 (09/26 0645) BP: (113-126)/(64-70) 113/64 (09/26 0645) SpO2:  [96 %-98 %] 98 % (09/26 0645) Last BM Date:  (Pt unsure)  Intake/Output from previous day: 09/25 0701 - 09/26 0700 In: 720 [P.O.:720] Out: 2900 [Urine:2900] Intake/Output this shift: No intake/output data recorded.  General appearance: alert, cooperative and no distress Resp: normal work breathing GI: soft, mimimally tender at port  sites, some bruising otherwise c/d/i with dermabond  Lab Results:   Recent Labs  02/27/17 1234  WBC 21.5*  HGB 10.9*  HCT 32.0*  PLT 377   BMET  Recent Labs  03/01/17 0518 03/02/17 0534  NA 140 139  K 3.7 2.8*  CL 106 101  CO2 25 28  GLUCOSE 118* 116*  BUN 6 5*  CREATININE 0.57* 0.35*  CALCIUM 8.1* 7.9*   PT/INR No results for input(s): LABPROT, INR in the last 72 hours.  Studies/Results: No results found.  Anti-infectives: Anti-infectives    Start     Dose/Rate Route Frequency Ordered Stop   02/28/17 0756  cefoTEtan in Dextrose 5% (CEFOTAN) IVPB 2 g  Status:  Discontinued     2 g Intravenous On call to O.R. 02/28/17 0757 02/28/17 1217   02/28/17 0600  cefoTEtan (CEFOTAN) 2 g in dextrose 5 % 50 mL IVPB     2 g 100 mL/hr over 30 Minutes Intravenous On call to O.R. 02/27/17 1228 02/28/17 0928   02/22/17 1315  piperacillin-tazobactam (ZOSYN) IVPB 3.375 g  Status:  Discontinued     3.375 g 12.5 mL/hr over 240 Minutes Intravenous Every 8 hours 02/22/17 1302 03/02/17 0953      Assessment/Plan: Craig [name] POD 2 s/p LAPAROSCOPIC CHOLECYSTECTOMY for gallstone pancreatitis. Pathology showed cholelithiasis and minimal chronic cholecystitis.  He is  doing fair but did have some nausea and vomiting this AM.  -Once tolerating diet and staying hydrated can d/c   -Is taking oral medications without issues  -No indication at this time for zosyn from surgical standpoint    LOS: 8 days    Craig [name] 03/02/2017

## 2017-03-02 NOTE — Progress Notes (Signed)
Night shift floor coverage note.  Craig [name] is a 56 y.o. 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 The patient was seen after the nursing staff reported that he may have some neurological changes since earlier today. Apparently the patient has been having increased slurred speech from baseline and occasional inability to find words. He also mentions increased paresthesia on right lower extremity. He denies headache, blurred vision or any other new or worsened focal deficit.   03/02/17 2228  97.8 F  63  --  16  113/67  Lying  97 %  Room Air       Gen. NAD, afebrile. HEENT: left eye droop which is at baseline per nursing staff Neck: Supple, no JVD. Lungs: CTA bilaterally Cardiovascular: S1-S2, RRR, no edema Abdomen: Mildly distended, soft, tympanic, mildly tender at incision sites. Extremities: No edema or cyanosis Neuro: AAOX4, speech is intermittently slurred, 4 1/2 right-sided hemiparesis, the patient reports hyperesthesia of the right lower extremity at the foot and lower half pretibial area  Earlier labs are reviewed. CT scanner was not available. MRI order. See below report:   CLINICAL DATA:  56 y/o M; status post laparoscopic cholecystectomy. Focal neurological deficit, greater than 6 hours, stroke suspected.  EXAM: MRI HEAD WITHOUT CONTRAST  TECHNIQUE: Axial DWI, coronal DWI, sagittal T1, and axial T2 weighted sequences were acquired. The patient was unable to hold still or tolerate examination and refused to continue.  COMPARISON:  11/07/2016 CT head  FINDINGS: Satisfactory axial and coronal diffusion weighted sequences. No reduced diffusion to suggest acute or early subacute infarction.  Motion degraded sagittal T1 and axial  T2 weighted sequence. No gross focal mass effect or structural abnormality of the brain. No hydrocephalus. No herniation.  IMPRESSION: No evidence of acute or early subacute infarction. No gross mass effect, herniation, or hydrocephalus.   Electronically Signed   By: Mitzi Hansen M.D.   On: 03/03/2017 00:36   1) Slurred speech Imaging done shows no evidence of acute or early subacute infarction. No other acute findings were seen. His increased slurred speech may be related to some of the medications, subjective perception by the patient or staff. It is difficult to appreciate since the patient's speech is slurred at baseline. Continue neuro checks.   Over 30 minutes of critical care time were spent evaluating the patient, checking his chart and setting MRI study request

## 2017-03-02 NOTE — Progress Notes (Signed)
Patient felt nauseated this morning feels better now will eat lunch has not had a bowel movement potassium 2.8 we'll replenish intravenously and orally will add 40 mEq KCl daily. KOLBE DELMONACO KVQ:[id number] DOB: 1961/04/28 DOA: 02/22/2017 PCP: Lucia Gaskins, MD   Physical Exam: Blood pressure 113/64, pulse 69, temperature 98.4 F (36.9 C), temperature source Axillary, resp. rate 18, height '5\' 6"'  (1.676 m), weight 95.7 kg (210 lb 15.7 oz), SpO2 98 %. Patient alert and oriented cranial nerves grossly intact patient will 4 extremities plantars downgoing lungs show prolonged respiratory phase scattered rhonchi no rales no wheezes audible heart regular no S3-S4 no heaves thrills rubs abdomen soft bowel sounds normoactive no peristaltic rushes no guarding or rebound   Investigations:  Recent Results (from the past 240 hour(s))  MRSA PCR Screening     Status: None   Collection Time: 02/22/17 10:10 AM  Result Value Ref Range Status   MRSA by PCR NEGATIVE NEGATIVE Final    Comment:        The GeneXpert MRSA Assay (FDA approved for NASAL specimens only), is one component of a comprehensive MRSA colonization surveillance program. It is not intended to diagnose MRSA infection nor to guide or monitor treatment for MRSA infections.   Culture, blood (routine x 2)     Status: None   Collection Time: 02/22/17  2:16 PM  Result Value Ref Range Status   Specimen Description BLOOD LEFT HAND  Final   Special Requests   Final    BOTTLES DRAWN AEROBIC AND ANAEROBIC Blood Culture adequate volume   Culture NO GROWTH 5 DAYS  Final   Report Status 02/27/2017 FINAL  Final  Culture, blood (routine x 2)     Status: None   Collection Time: 02/22/17  2:17 PM  Result Value Ref Range Status   Specimen Description RIGHT ANTECUBITAL  Final   Special Requests   Final    BOTTLES DRAWN AEROBIC AND ANAEROBIC Blood Culture adequate volume   Culture NO GROWTH 5 DAYS  Final   Report Status 02/27/2017 FINAL   Final     Basic Metabolic Panel:  Recent Labs  02/27/17 1234  03/01/17 0518 03/02/17 0534  NA 134*  < > 140 139  K 2.7*  < > 3.7 2.8*  CL 102  < > 106 101  CO2 21*  < > 25 28  GLUCOSE 128*  < > 118* 116*  BUN <5*  < > 6 5*  CREATININE 0.47*  < > 0.57* 0.35*  CALCIUM 8.0*  < > 8.1* 7.9*  MG 1.7  --   --   --   < > = values in this interval not displayed. Liver Function Tests:  Recent Labs  02/27/17 1234  AST 32  ALT 40  ALKPHOS 86  BILITOT 0.7  PROT 5.6*  ALBUMIN 2.3*     CBC:  Recent Labs  02/27/17 1234  WBC 21.5*  NEUTROABS 18.3*  HGB 10.9*  HCT 32.0*  MCV 87.9  PLT 377    No results found.    Medications  Impression: Principal Problem:   Acute gallstone pancreatitis Active Problems:   Elevated liver enzymes   Chronic cholecystitis     Plan:KCl 30 mEq IV now and KCl 40 mg by mouth daily be met in a.m. liver profile today monitor how he tolerates food and consider discharge in 24 hours   Consultants: Gastroenterology and surgery   Procedures status post lap cholecystectomy   Antibiotics:  Time spent: 30 minutes   LOS: 8 days   Annalena Piatt M   03/02/2017, 11:30 AM

## 2017-03-03 ENCOUNTER — Encounter: Payer: Self-pay | Admitting: General Surgery

## 2017-03-03 LAB — BASIC METABOLIC PANEL
ANION GAP: 11 (ref 5–15)
BUN: <5 mg/dL — ABNORMAL LOW (ref 6–20)
CALCIUM: 8 mg/dL — AB (ref 8.9–10.3)
CHLORIDE: 102 mmol/L (ref 101–111)
CO2: 25 mmol/L (ref 22–32)
CREATININE: 0.46 mg/dL — AB (ref 0.61–1.24)
GFR calc non Af Amer: >60 mL/min (ref >60)
Glucose, Bld: 115 mg/dL — ABNORMAL HIGH (ref 65–99)
Potassium: 3.3 mmol/L — ABNORMAL LOW (ref 3.5–5.1)
SODIUM: 138 mmol/L (ref 135–145)

## 2017-03-03 MED ORDER — POTASSIUM CHLORIDE CRYS ER 20 MEQ PO TBCR: 20.0000 meq | EXTENDED_RELEASE_TABLET | Freq: Two times a day (BID) | ORAL | 1 refills | Status: DC

## 2017-03-03 MED ORDER — PANTOPRAZOLE SODIUM 40 MG PO TBEC: 40.0000 mg | DELAYED_RELEASE_TABLET | Freq: Every day | ORAL | 1 refills | Status: DC

## 2017-03-03 NOTE — Addendum Note (Signed)
Addendum  created 03/03/17 1435 by Jeani Hawking, CRNA   Anesthesia Attestations filed

## 2017-03-03 NOTE — Care Management Important Message (Signed)
Important Message  Patient Details  Name: SYNCERE EBLE MRN: [id number] Date of Birth: [birth year]   Medicare Important Message Given:  Yes    Malcolm Metro, RN 03/03/2017, 1:11 PM

## 2017-03-03 NOTE — Progress Notes (Deleted)
Pt's potassium this a.m. Low, 3 runs KCL ordered but not given during day, made MD aware and 4 runs KCL ordered now, will provide.

## 2017-03-03 NOTE — Progress Notes (Signed)
Upon assessment of patient found that speech is significantly more slurred than previous night or pt's baseline and pt can't seem to get words out or express what he is trying to say. Nero check performed on pt, speech is only area that seems significantly different. Vitals stable. MD made aware of change.

## 2017-03-03 NOTE — Progress Notes (Signed)
Spoke with ED MD, [facility], and charge RN after MD assessment of pt. Believe that an MRI should be performed to rule out acute change in pt with onset of speech change and ensure pt safety.

## 2017-03-03 NOTE — Care Management Note (Signed)
Case Management Note  Patient Details  Name: OLIVIER FRAYRE MRN: [id number] Date of Birth: 1960/10/20  If discussed at Long Length of Stay Meetings, dates discussed:  03/03/2017   Malcolm Metro, RN 03/03/2017, 1:15 PM

## 2017-03-03 NOTE — Progress Notes (Signed)
Pt's potassium low this a.m.Craig [name] 3 runs KCL ordered but not given during day. MD aware and 4 runs KCL ordered now, will provide.

## 2017-03-03 NOTE — Care Management Note (Addendum)
Case Management Note  Patient Details  Name: Craig [name] MRN: [id number] Date of Birth: 05-Nov-1960  Subjective/Objective:                  Admitted with pancreatitis and cholecystitis, s/p lap chole. Pt admitted from [facility] jail. He is discharging to [facility] in [place]. MD has contacted NP there and DC summary has been faxed. PT has recommended HH PT and RW. Pt unable to have DME/PT at [facility].   Action/Plan: Discharging today to [facility].  Expected Discharge Date:  03/03/17               Expected Discharge Plan:  [facility]  In-House Referral:  NA  Discharge planning Services  CM Consult  Post Acute Care Choice:  NA Choice offered to:  NA  Status of Service:  Completed, signed off  Malcolm Metro, RN 03/03/2017, 1:12 PM

## 2017-03-03 NOTE — Progress Notes (Signed)
Pt's continues to struggle with speech and words are very slurred or pt stumbles over words with severe stuttering. Contacted ED MD to come and assess pt.

## 2017-03-03 NOTE — Progress Notes (Signed)
Physical Therapy Treatment Patient Details Name: Craig [name] MRN: [id number] DOB: 02/14/1961 Today's Date: 03/03/2017    History of Present Illness Craig [name] is a 56 y.o. male with known bipolar disorder, PTSD, COPD, presented to [facility] emergency department with main concern of progressively worsening generalized abdominal pain, 3 days in duration. Patient reports pain initially intermittent and started in epigastric area however, progressed to constant and sharp pain in the past 24 hours, associated with nausea and nonbloody vomiting, inability to keep food down. Patient reports pain is occasionally radiating to the back and he denies any specific alleviating factors. Patient denies chest pain or shortness of breath, no recent similar episodes.    PT Comments    Patient demonstrates increased tolerance for gait training, limited secondary to c/o fatigue and abdominal pain - RN notified for pain medication, patient tolerated sitting up in chair with [facility] staff supervising.  Plan: Patient to be discharged from hospital today.  Patient discharged to HEP recommendations listed below.    Follow Up Recommendations  Home health PT;Supervision/Assistance - 24 hour     Equipment Recommendations  None recommended by PT    Recommendations for Other Services       Precautions / Restrictions Precautions Precautions: Fall Restrictions Weight Bearing Restrictions: No    Mobility  Bed Mobility Overal bed mobility: Needs Assistance Bed Mobility: Supine to Sit;Sit to Supine     Supine to sit: Supervision Sit to supine: Supervision      Transfers Overall transfer level: Needs assistance Equipment used: None;1 person hand held assist Transfers: Sit to/from Stand;Stand Pivot Transfers Sit to Stand: Min guard Stand pivot transfers: Min guard          Ambulation/Gait Ambulation/Gait assistance: Min guard Ambulation Distance (Feet): 38 Feet Assistive device: 1  person hand held assist;None Gait Pattern/deviations: Decreased step length - right;Decreased step length - left;Decreased stride length   Gait velocity interpretation: Below normal speed for age/gender General Gait Details: Patient demonstrates increased endurance, slow unsteady cadence with very kyphotic trunk, no loss of balance, limited secondary to c/o fatigue   Stairs            Wheelchair Mobility    Modified Rankin (Stroke Patients Only)       Balance Overall balance assessment: Needs assistance Sitting-balance support: Feet supported Sitting balance-Leahy Scale: Good     Standing balance support: No upper extremity supported;During functional activity Standing balance-Leahy Scale: Fair                              Cognition Arousal/Alertness: Awake/alert Behavior During Therapy: WFL for tasks assessed/performed Overall Cognitive Status: Within Functional Limits for tasks assessed                                        Exercises General Exercises - Lower Extremity Ankle Circles/Pumps: Seated;AROM;Both;10 reps Long Arc Quad: Seated;AROM;Both;10 reps    General Comments        Pertinent Vitals/Pain Pain Score: 6  Pain Location: abdomen at surgical site Pain Descriptors / Indicators: Aching;Nagging    Home Living                      Prior Function            PT Goals (current goals can now be found in the  care plan section) Acute Rehab PT Goals Patient Stated Goal: walk again PT Goal Formulation: With patient Time For Goal Achievement: 03/09/17 Potential to Achieve Goals: Good Progress towards PT goals: Progressing toward goals    Frequency    Min 3X/week      PT Plan Current plan remains appropriate    Co-evaluation              AM-PAC PT "6 Clicks" Daily Activity  Outcome Measure  Difficulty turning over in bed (including adjusting bedclothes, sheets and blankets)?: None Difficulty  moving from lying on back to sitting on the side of the bed? : None Difficulty sitting down on and standing up from a chair with arms (e.g., wheelchair, bedside commode, etc,.)?: None Help needed moving to and from a bed to chair (including a wheelchair)?: A Little Help needed walking in hospital room?: A Little Help needed climbing 3-5 steps with a railing? : A Little 6 Click Score: 21    End of Session Equipment Utilized During Treatment: Gait belt Activity Tolerance: Patient tolerated treatment well;Patient limited by fatigue Patient left: in chair;with call bell/phone within reach (prison staff present in room) Nurse Communication: Mobility status PT Visit Diagnosis: Unsteadiness on feet (R26.81);Other abnormalities of gait and mobility (R26.89);Muscle weakness (generalized) (M62.81)     Time: 1610-9604 PT Time Calculation (min) (ACUTE ONLY): 26 min  Charges:  $Therapeutic Activity: 23-37 mins                    G Codes:       1:25 PM, 03-30-17 Ocie Bob, MPT Physical Therapist with [facility] [phone] office [phone] mobile phone

## 2017-03-03 NOTE — Discharge Summary (Signed)
Physician Discharge Summary  Craig [name]:[id number] DOB: 1961-04-23 DOA: 02/22/2017  PCP: Oval Linsey, MD  Admit date: 02/22/2017 Discharge date: 03/03/2017   Recommendations for Outpatient Follow-up:  The patient is transferred to [facility] with history part of corrections personnel present here. He is to follow-up with medical personnel there he is status post cholecystectomy 3 days prior to discharge for gallstone pancreatitis he had normal bilirubin and transaminases prior to surgery and has had a non-complicated postop hospital course. Patient will need to have his sutures removed and checked within several days by the hospital staff at [facility]. He is discharged on a regular diet and tolerating that well. He likewise carries to comorbid conditions of chronic hypomanic bipolar disorder chronic degenerative joint disease with a 6 level fusion in his lumbosacral distribution and a three-level fusion in the cervical distribution as well as a history of fractured pelvis from a suicide attempt. He likewise has chronic COPD but has been incarcerated for urinary having is not been smoking and this is under pretty good control with just oral inhalers  Discharge Diagnoses:  Principal Problem:   Acute gallstone pancreatitis Active Problems:   Elevated liver enzymes   Chronic cholecystitis   Discharge Condition: Good  Filed Weights   02/22/17 0156 02/22/17 1042  Weight: 95.7 kg (211 lb) 95.7 kg (210 lb 15.7 oz)    History of present illness:  The patient is a 8-year-old white male with chronic bipolar disorder usually in a hypomanic state question of noncompliance. Major depression. Chronic degenerative disc disease with 4 level fusion in the lumbosacral division distribution and 3 level fusion in the cervical tissue present as well as a history of fractured pelvis for suicide attempt. The patient is at a cholecystectomy 3 days prior to  discharge medicine uncomplicated postop course was tolerating oral regular diet well as good flatulence has not had a bowel movement thus far and [facility] as well as additional processing. He is on no opioid analgesia medication at present and is encouraged to ambulate for strengthening purposes as well as avoidance of DVT  Hospital Course:  See history of present illness above  Procedures: Status post lap cholecystectomy  Consultations:  Gen. surgery and gastroenterology  Discharge Instructions  Discharge Instructions    Discharge instructions    Complete by:  As directed    Discharge patient    Complete by:  As directed    Discharge disposition:  [facility] Not Defined   Discharge patient date:  03/03/2017     Allergies as of 03/03/2017   No Known Allergies     Medication List    TAKE these medications   albuterol 108 (90 Base) MCG/ACT inhaler Commonly known as:  PROVENTIL HFA;VENTOLIN HFA Inhale 2 puffs into the lungs every 6 (six) hours as needed for wheezing or shortness of breath.   gabapentin 300 MG capsule Commonly known as:  NEURONTIN One tablet bid What changed:  how much to take  how to take this  when to take this  additional instructions   ipratropium-albuterol 0.5-2.5 (3) MG/3ML Soln Commonly known as:  DUONEB Take 3 mLs by nebulization every 4 (four) hours as needed.   lithium carbonate 300 MG capsule Take 300 mg by mouth 2 (two) times daily with a meal.   pantoprazole 40 MG tablet Commonly known as:  PROTONIX Take 1 tablet (40 mg total) by  mouth daily before breakfast.   potassium chloride SA 20 MEQ tablet Commonly known as:  K-DUR,KLOR-CON Take 1 tablet (20 mEq total) by mouth 2 (two) times daily.            Discharge Care Instructions        Start     Ordered   03/04/17 0000  pantoprazole (PROTONIX) 40 MG tablet  Daily before breakfast      03/03/17 1137   03/03/17 0000  potassium chloride SA (K-DUR,KLOR-CON) 20 MEQ tablet  2 times daily     03/03/17 1137   03/03/17 0000  Discharge instructions     03/03/17 1137   03/03/17 0000  Discharge patient    Question Answer Comment  Discharge disposition [facility] Not Defined   Discharge patient date 03/03/2017      03/03/17 1137     No Known Allergies    The results of significant diagnostics from this hospitalization (including imaging, microbiology, ancillary and laboratory) are listed below for reference.    Significant Diagnostic Studies: Mr Brain 8 Contrast  Result Date: 03/03/2017 CLINICAL DATA:  56 y/o M; status post laparoscopic cholecystectomy. Focal neurological deficit, greater than 6 hours, stroke suspected. EXAM: MRI HEAD WITHOUT CONTRAST TECHNIQUE: Axial DWI, coronal DWI, sagittal T1, and axial T2 weighted sequences were acquired. The patient was unable to hold still or tolerate examination and refused to continue. COMPARISON:  11/07/2016 CT head FINDINGS: Satisfactory axial and coronal diffusion weighted sequences. No reduced diffusion to suggest acute or early subacute infarction. Motion degraded sagittal T1 and axial T2 weighted sequence. No gross focal mass effect or structural abnormality of the brain. No hydrocephalus. No herniation. IMPRESSION: No evidence of acute or early subacute infarction. No gross mass effect, herniation, or hydrocephalus. Electronically Signed   By: Mitzi Hansen M.D.   On: 03/03/2017 00:36   Ct Abdomen Pelvis W Contrast  Result Date: 02/22/2017 CLINICAL DATA:  56 year old male with abdominal pain and vomiting for 4 days. Elevated lipase. Suspect pancreatitis. Prior appendectomy and back surgery (as on prior CT report). Initial encounter. EXAM: CT ABDOMEN AND PELVIS WITH CONTRAST TECHNIQUE: Multidetector CT imaging of the abdomen and pelvis was performed using the standard protocol following bolus  administration of intravenous contrast. CONTRAST:  ISOVUE-300 IOPAMIDOL (ISOVUE-300) INJECTION 61% COMPARISON:  01/01/2016 chest CT. 04/19/2005 abdominal/pelvic CT. Lumbar spine plain film examination 12/07/2010. FINDINGS: Lower chest: Basilar atelectasis/scarring.  Heart top-normal size. Hepatobiliary: No worrisome hepatic lesion. Focal fatty infiltration adjacent to the fissure for the falciform ligament. No calcified gallstone or common bile duct stone. Pancreas: Diffuse inflammation most prominent pancreatic head level. Slightly heterogeneous pancreatic head/ uncinate process without discrete mass identified. Pancreas can be evaluated after pancreatitis is cleared. Spleen: No mass or enlargement. Adrenals/Urinary Tract: Adrenal glands unremarkable. Right renal cysts measuring up to 6 cm. Scarred kidneys greater on the right. No obstructing stone or hydronephrosis. Extra renal pelvis on the right. Noncontrast filled views of the urinary bladder unremarkable. Stomach/Bowel: Diffuse inflammation surrounds the duodenum which is most likely secondary to the pancreatitis rather than primary duodenal abnormality. Small hiatal hernia. Circumferential thickening distal esophagus may be related to under distension/reflux. Limited for evaluating for mass secondary to under distension. Vascular/Lymphatic: Mild atherosclerotic changes aorta, iliac artery is an right femoral artery. No abdominal aortic aneurysm or large vessel occlusion. Portal vein, splenic vein and superior mesenteric vein are patent. Small upper abdominal lymph nodes possibly reactive in origin. Reproductive: No worrisome abnormality. Other: Free fluid felt  to be most consistent with fluid secondary to pancreatitis extends throughout the abdomen and upper pelvis Musculoskeletal: Extensive surgery with fusion T12 through S1 and corpectomy L4 level. Remote T10 compression fracture with anterior wedge configuration with 90% loss height anteriorly with  kyphosis centered at this level with the cord draped over this region. Prominent T11-12 degenerative changes. IMPRESSION: Findings most consistent with pancreatitis. No evidence of calcified obstructing common bile duct stone or pancreatic mass. Evaluation for pancreatic lesion limited secondary to the degree of inflammation. Fluid throughout portions of the abdomen and pelvis felt most likely related to the pancreatitis. Secondary moderate inflammation of the duodenum. No venous thrombosis noted. Aortic Atherosclerosis (ICD10-I70.0). Right renal cysts largest 6 cm. Renal scarring greater on the right. No hydronephrosis. Small hiatal hernia. Circumferential thickening distal esophagus may be related to under distension/reflux. Limited for evaluating for mass secondary to under distension. Extensive surgery with fusion T12 through S1 and corpectomy L4 level. Remote T10 compression fracture with anterior wedge configuration with 90% loss height anteriorly with kyphosis centered at this level with the cord draped over this region. Prominent T11-12 degenerative changes. Electronically Signed   By: Lacy Duverney M.D.   On: 02/22/2017 07:18   US Abdomen Limited  Result Date: 02/23/2017 CLINICAL DATA:  Evaluate for gallstones. Increased liver function tests. Abdominal pain and vomiting. Elevated lipase. EXAM: ULTRASOUND ABDOMEN LIMITED RIGHT UPPER QUADRANT COMPARISON:  CT abdomen pelvis 02/22/2017. FINDINGS: Gallbladder: Gallbladder calculi are present with layering sludge. At least one stone measures 10 mm in longest dimension. Gallbladder wall thickness 3 mm. Negative sonographic Murphy's sign. Common bile duct: Diameter: Dilated, up to 8.3 mm. No common duct stones could be identified. Liver: Increased echogenicity consistent with steatosis. Portal vein is patent on color Doppler imaging with normal direction of blood flow towards the liver. IMPRESSION: Cholelithiasis and gallbladder sludge. Extrahepatic biliary  ductal dilatation, common bile duct diameter greater than 8 mm. Electronically Signed   By: Elsie Stain M.D.   On: 02/23/2017 09:32    Microbiology: Recent Results (from the past 240 hour(s))  MRSA PCR Screening     Status: None   Collection Time: 02/22/17 10:10 AM  Result Value Ref Range Status   MRSA by PCR NEGATIVE NEGATIVE Final    Comment:        The GeneXpert MRSA Assay (FDA approved for NASAL specimens only), is one component of a comprehensive MRSA colonization surveillance program. It is not intended to diagnose MRSA infection nor to guide or monitor treatment for MRSA infections.   Culture, blood (routine x 2)     Status: None   Collection Time: 02/22/17  2:16 PM  Result Value Ref Range Status   Specimen Description BLOOD LEFT HAND  Final   Special Requests   Final    BOTTLES DRAWN AEROBIC AND ANAEROBIC Blood Culture adequate volume   Culture NO GROWTH 5 DAYS  Final   Report Status 02/27/2017 FINAL  Final  Culture, blood (routine x 2)     Status: None   Collection Time: 02/22/17  2:17 PM  Result Value Ref Range Status   Specimen Description RIGHT ANTECUBITAL  Final   Special Requests   Final    BOTTLES DRAWN AEROBIC AND ANAEROBIC Blood Culture adequate volume   Culture NO GROWTH 5 DAYS  Final   Report Status 02/27/2017 FINAL  Final     Labs: Basic Metabolic Panel:  Recent Labs Lab 02/27/17 1234 02/28/17 0552 03/01/17 0518 03/02/17 0534 03/03/17 0454  NA  134* 136 140 139 138  K 2.7* 3.2* 3.7 2.8* 3.3*  CL 102 101 106 101 102  CO2 21* GLUCOSE 128* 116* 118* 116* 115*  BUN <5* <5* 6 5* <5*  CREATININE 0.47* 0.46* 0.57* 0.35* 0.46*  CALCIUM 8.0* 8.0* 8.1* 7.9* 8.0*  MG 1.7  --   --   --   --    Liver Function Tests:  Recent Labs Lab 02/25/17 0643 02/26/17 0552 02/27/17 1234 03/02/17 0534  AST 30 32 32 31  ALT 57 45 40 32  ALKPHOS 83 83 86 68  BILITOT 1.1 1.1 0.7 0.9  PROT 6.1* 5.7* 5.6* 5.5*  ALBUMIN 2.7* 2.4* 2.3* 2.2*     Recent Labs Lab 02/25/17 0643 02/26/17 0552  LIPASE 24 22   No results for input(s): AMMONIA in the last 168 hours. CBC:  Recent Labs Lab 02/27/17 1234  WBC 21.5*  NEUTROABS 18.3*  HGB 10.9*  HCT 32.0*  MCV 87.9  PLT 377   Cardiac Enzymes: No results for input(s): CKTOTAL, CKMB, CKMBINDEX, TROPONINI in the last 168 hours. BNP: BNP (last 3 results) No results for input(s): BNP in the last 8760 hours.  ProBNP (last 3 results) No results for input(s): PROBNP in the last 8760 hours.  CBG: No results for input(s): GLUCAP in the last 168 hours.     Signed:  Ian Castagna Judie Petit  [facility] Pager: [phone] 03/03/2017, 11:37 AM

## 2017-03-03 NOTE — Telephone Encounter (Signed)
Error

## 2017-03-16 ENCOUNTER — Encounter: Payer: Self-pay | Admitting: General Surgery

## 2017-03-16 NOTE — Telephone Encounter (Signed)
Error in creating

## 2020-05-18 ENCOUNTER — Emergency Department (HOSPITAL_COMMUNITY)
Admission: EM | Admit: 2020-05-18 | Discharge: 2020-05-20 | Disposition: A | Discharge status: Home / Self Care | Attending: Emergency Medicine | Admitting: Emergency Medicine

## 2020-05-18 ENCOUNTER — Other Ambulatory Visit: Payer: Self-pay

## 2020-05-18 ENCOUNTER — Encounter (HOSPITAL_COMMUNITY): Payer: Self-pay | Admitting: Emergency Medicine

## 2020-05-18 DIAGNOSIS — J441 Chronic obstructive pulmonary disease with (acute) exacerbation: Secondary | ICD-10-CM | POA: Insufficient documentation

## 2020-05-18 DIAGNOSIS — F319 Bipolar disorder, unspecified: Secondary | ICD-10-CM

## 2020-05-18 DIAGNOSIS — F312 Bipolar disorder, current episode manic severe with psychotic features: Secondary | ICD-10-CM | POA: Insufficient documentation

## 2020-05-18 DIAGNOSIS — Z20822 Contact with and (suspected) exposure to covid-19: Secondary | ICD-10-CM | POA: Insufficient documentation

## 2020-05-18 DIAGNOSIS — Z87891 Personal history of nicotine dependence: Secondary | ICD-10-CM | POA: Insufficient documentation

## 2020-05-18 NOTE — ED Triage Notes (Signed)
Pt here with an original complaint of knee pain but is currently exhibiting manic behavior with rambling speech.

## 2020-05-19 LAB — RESP PANEL BY RT-PCR (FLU A&B, COVID) ARPGX2
Influenza A by PCR: NEGATIVE
Influenza B by PCR: NEGATIVE
SARS Coronavirus 2 by RT PCR: NEGATIVE

## 2020-05-19 LAB — ACETAMINOPHEN LEVEL: Acetaminophen (Tylenol), Serum: <10 ug/mL — ABNORMAL LOW (ref 10–30)

## 2020-05-19 LAB — CBC
HCT: 42.8 % (ref 39.0–52.0)
Hemoglobin: 14.2 g/dL (ref 13.0–17.0)
MCH: 30 pg (ref 26.0–34.0)
MCHC: 33.2 g/dL (ref 30.0–36.0)
MCV: 90.5 fL (ref 80.0–100.0)
Platelets: 314 10*3/uL (ref 150–400)
RBC: 4.73 MIL/uL (ref 4.22–5.81)
RDW: 12.1 % (ref 11.5–15.5)
WBC: 10.5 10*3/uL (ref 4.0–10.5)
nRBC: 0 % (ref 0.0–0.2)

## 2020-05-19 LAB — COMPREHENSIVE METABOLIC PANEL
ALT: 17 U/L (ref 0–44)
AST: 23 U/L (ref 15–41)
Albumin: 4.6 g/dL (ref 3.5–5.0)
Alkaline Phosphatase: 65 U/L (ref 38–126)
Anion gap: 10 (ref 5–15)
BUN: 12 mg/dL (ref 6–20)
CO2: 23 mmol/L (ref 22–32)
Calcium: 9.3 mg/dL (ref 8.9–10.3)
Chloride: 102 mmol/L (ref 98–111)
Creatinine, Ser: 0.68 mg/dL (ref 0.61–1.24)
GFR, Estimated: >60 mL/min (ref >60)
Glucose, Bld: 116 mg/dL — ABNORMAL HIGH (ref 70–99)
Potassium: 3.5 mmol/L (ref 3.5–5.1)
Sodium: 135 mmol/L (ref 135–145)
Total Bilirubin: 0.8 mg/dL (ref 0.3–1.2)
Total Protein: 7.6 g/dL (ref 6.5–8.1)

## 2020-05-19 LAB — RAPID URINE DRUG SCREEN, HOSP PERFORMED
Amphetamines: NOT DETECTED
Barbiturates: NOT DETECTED
Benzodiazepines: NOT DETECTED
Cocaine: NOT DETECTED
Opiates: NOT DETECTED
Tetrahydrocannabinol: POSITIVE — AB

## 2020-05-19 LAB — ETHANOL: Alcohol, Ethyl (B): <10 mg/dL (ref <10)

## 2020-05-19 LAB — SALICYLATE LEVEL: Salicylate Lvl: <7 mg/dL — ABNORMAL LOW (ref 7.0–30.0)

## 2020-05-19 LAB — LITHIUM LEVEL: Lithium Lvl: 0.2 mmol/L — ABNORMAL LOW (ref 0.60–1.20)

## 2020-05-19 MED ORDER — OLANZAPINE 5 MG PO TBDP
10.0000 mg | ORAL_TABLET | Freq: Three times a day (TID) | ORAL | Status: DC | PRN
  Administered 2020-05-19: 06:00:00 10 mg via ORAL
  Filled 2020-05-19: qty 2

## 2020-05-19 MED ORDER — LORAZEPAM 1 MG PO TABS
1.0000 mg | ORAL_TABLET | ORAL | Status: AC | PRN
  Administered 2020-05-19: 06:00:00 1 mg via ORAL
  Filled 2020-05-19: qty 1

## 2020-05-19 MED ORDER — ZIPRASIDONE MESYLATE 20 MG IM SOLR: 10.0000 mg | Freq: Once | INTRAMUSCULAR | Status: AC

## 2020-05-19 MED ORDER — ZIPRASIDONE MESYLATE 20 MG IM SOLR: 10.0000 mg | INTRAMUSCULAR | Status: DC | PRN

## 2020-05-19 MED ORDER — ZIPRASIDONE MESYLATE 20 MG IM SOLR
INTRAMUSCULAR | Status: AC
  Administered 2020-05-19: 01:00:00 10 mg via INTRAMUSCULAR
  Filled 2020-05-19: qty 20

## 2020-05-19 NOTE — Progress Notes (Signed)
0000- pt opening door talking loudly into hallway, redirected back into room, pt repeats behavior. TTS done.  0020- pt saw RN walking to his room, pt took up a fighting stance and became verbally and physically aggressive. Unable to de-escalate, unable to determine what set patient off. Pt refused to return to room and continued being loud and aggressive.  0037-pt given geodon IM, dressed in purple scrubs. Pt's belongings secured in designated area.  0047- Pt still alert and mobile, standing in his doorway, talking pleasantly, giggling his chest. Will continue to monitor.

## 2020-05-19 NOTE — ED Provider Notes (Signed)
[facility] EMERGENCY DEPARTMENT Provider Note   CSN: [id number] Arrival date & time: 05/18/20  2305   Time seen 11:54 PM  History Chief Complaint  Patient presents with  . Medical Clearance    Craig [name] is a 59 y.o. male.  HPI   Patient presents to the emergency department for unclear reason, he states he is here "for the [place]", however during the course of triage and during my exam patient has had  difficulty focusing on one topic, he moves freely between topics and is constantly talking.  He denied having any acute medical problems to me.  However when I left the room he asked me if he was going to get a shot, and when I asked him shot for what he states Demerol and Percocet.  Later on he is at the door talking to himself and states that he is having bronchitis.  He states "they took away my Prison inhaler".  Patient does not have suicidal or homicidal ideation.  He initially told me he takes no medication he then stated that "a nurse gives me my lithium every day", he then adds that he is on BuSpar, gabapentin, Tylenol PM and a muscle relaxer 0.5 mg at bedtime.  He denies drinking alcohol to me but then he states he makes something homemade that he will share with [place].  PCP Oval Linsey, MD   Past Medical History:  Diagnosis Date  . Bipolar 1 disorder (HCC)   . COPD (chronic obstructive pulmonary disease) (HCC)   . PTSD (post-traumatic stress disorder)   . Stroke [facility])    left-sided weakness, around 2008    Patient Active Problem List   Diagnosis Date Noted  . Chronic cholecystitis 02/28/2017  . Elevated liver enzymes   . Acute gallstone pancreatitis 02/22/2017  . COPD with acute exacerbation (HCC) 01/01/2016  . Bipolar 1 disorder (HCC) 01/01/2016  . Leukocytosis 01/01/2016    Past Surgical History:  Procedure Laterality Date  . APPENDECTOMY    . BACK SURGERY    . CHOLECYSTECTOMY N/A 02/28/2017   Procedure: LAPAROSCOPIC CHOLECYSTECTOMY, possible  open;  Surgeon: Lucretia Roers, MD;  Location: [facility];  Service: General;  Laterality: N/A;       Family History  Problem Relation Age of Onset  . Colon cancer Brother        diagnosed around age 29   . Pancreatic cancer Neg Hx   . Pancreatic disease Neg Hx     Social History   Tobacco Use  . Smoking status: Former Smoker    Types: Cigarettes  . Smokeless tobacco: Never Used  Substance Use Topics  . Alcohol use: No    Alcohol/week: 1.0 standard drink    Types: 1 Glasses of wine per week    Comment: occ  . Drug use: No    Types: Marijuana    Home Medications Prior to Admission medications   Medication Sig Start Date End Date Taking? Authorizing Provider  albuterol (PROVENTIL HFA;VENTOLIN HFA) 108 (90 Base) MCG/ACT inhaler Inhale 2 puffs into the lungs every 6 (six) hours as needed for wheezing or shortness of breath. 01/06/16   Oval Linsey, MD  gabapentin (NEURONTIN) 300 MG capsule One tablet bid Patient taking differently: Take 300 mg by mouth 2 (two) times daily. One tablet bid 02/09/13   Bethann Berkshire, MD  ipratropium-albuterol (DUONEB) 0.5-2.5 (3) MG/3ML SOLN Take 3 mLs by nebulization every 4 (four) hours as needed. 01/06/16   Oval Linsey, MD  lithium  carbonate 300 MG capsule Take 300 mg by mouth 2 (two) times daily with a meal.    [provider]  pantoprazole (PROTONIX) 40 MG tablet Take 1 tablet (40 mg total) by mouth daily before breakfast. 03/04/17   Oval Linsey, MD  potassium chloride SA (K-DUR,KLOR-CON) 20 MEQ tablet Take 1 tablet (20 mEq total) by mouth 2 (two) times daily. 03/03/17   Oval Linsey, MD    Allergies    Patient has no known allergies.  Review of Systems   Review of Systems  Unable to perform ROS: Psychiatric disorder    Physical Exam Updated Vital Signs BP (!) 146/61 (BP Location: Right Arm)   Pulse 94   Temp 98.1 F (36.7 C) (Oral)   Resp 18   Ht 5\' 6"  (1.676 m)   Wt 95.7 kg   SpO2 97%   BMI 34.05 kg/m    Physical Exam Vitals and nursing note reviewed.  Constitutional:      General: He is not in acute distress.    Appearance: Normal appearance. He is normal weight. He is not ill-appearing or toxic-appearing.  HENT:     Head: Normocephalic and atraumatic.     Right Ear: External ear normal.     Left Ear: External ear normal.  Eyes:     Extraocular Movements: Extraocular movements intact.     Conjunctiva/sclera: Conjunctivae normal.     Pupils: Pupils are equal, round, and reactive to light.  Cardiovascular:     Rate and Rhythm: Normal rate and regular rhythm.     Pulses: Normal pulses.     Heart sounds: Normal heart sounds. No murmur heard.   Pulmonary:     Effort: Pulmonary effort is normal. No respiratory distress.     Breath sounds: Normal breath sounds.  Musculoskeletal:        General: Normal range of motion.     Cervical back: Normal range of motion.  Skin:    General: Skin is warm and dry.  Neurological:     General: No focal deficit present.     Mental Status: He is alert and oriented to person, place, and time.     Cranial Nerves: No cranial nerve deficit.  Psychiatric:        Attention and Perception: He is inattentive.        Mood and Affect: Mood is elated.        Speech: Speech is rapid and pressured and tangential.        Behavior: Behavior is hyperactive.        Thought Content: Thought content does not include homicidal or suicidal ideation.     ED Results / Procedures / Treatments   Labs (all labs ordered are listed, but only abnormal results are displayed) Results for orders placed or performed during the hospital encounter of 05/18/20  Comprehensive metabolic panel  Result Value Ref Range   Sodium 135 135 - 145 mmol/L   Potassium 3.5 3.5 - 5.1 mmol/L   Chloride 102 98 - 111 mmol/L   CO2 23 22 - 32 mmol/L   Glucose, Bld 116 (H) 70 - 99 mg/dL   BUN 12 6 - 20 mg/dL   Creatinine, Ser 14/12/21 0.61 - 1.24 mg/dL   Calcium 9.3 8.9 - 9.15 mg/dL   Total  Protein 7.6 6.5 - 8.1 g/dL   Albumin 4.6 3.5 - 5.0 g/dL   AST 23 15 - 41 U/L   ALT 17 0 - 44 U/L   Alkaline  Phosphatase 65 38 - 126 U/L   Total Bilirubin 0.8 0.3 - 1.2 mg/dL   GFR, Estimated >84 >69 mL/min   Anion gap 10 5 - 15  Ethanol  Result Value Ref Range   Alcohol, Ethyl (B) <10 <10 mg/dL  Salicylate level  Result Value Ref Range   Salicylate Lvl <7.0 (L) 7.0 - 30.0 mg/dL  Acetaminophen level  Result Value Ref Range   Acetaminophen (Tylenol), Serum <10 (L) 10 - 30 ug/mL  cbc  Result Value Ref Range   WBC 10.5 4.0 - 10.5 K/uL   RBC 4.73 4.22 - 5.81 MIL/uL   Hemoglobin 14.2 13.0 - 17.0 g/dL   HCT 62.9 52.8 - 41.3 %   MCV 90.5 80.0 - 100.0 fL   MCH 30.0 26.0 - 34.0 pg   MCHC 33.2 30.0 - 36.0 g/dL   RDW 24.4 01.0 - 27.2 %   Platelets 314 150 - 400 K/uL   nRBC 0.0 0.0 - 0.2 %  Lithium level  Result Value Ref Range   Lithium Lvl 0.20 (L) 0.60 - 1.20 mmol/L   Laboratory interpretation all normal except subtherapeutic lithium level    EKG None  Radiology No results found.  Procedures Procedures (including critical care time)  Medications Ordered in ED Medications  ziprasidone (GEODON) injection 10 mg (10 mg Intramuscular Given 05/19/20 0037)    ED Course  I have reviewed the triage vital signs and the nursing notes.  Pertinent labs & imaging results that were available during my care of the patient were reviewed by me and considered in my medical decision making (see chart for details).    MDM Rules/Calculators/A&P                         Medical clearance labs were done including a lithium level.  TTS consult was ordered.  12:25 AM patient can be heard yelling and getting argumentative with nursing staff, he is starting to be aggressive and calling them "B*tches", he hit the glass door.  Geodon 10 mg IM was ordered. Pt agreeable to getting injection.  I started out with 10 because he is a very small man.  TTS has evaluated patient and recommend inpatient  psychiatric admission.  02:51 Berna Spare, TTS, states no beds on [address], will have psychiatrist evaluate in am.   Psych holding orders were written.  IVC papers have been filled out in case patient becomes uncooperative and refuses further care.  Final Clinical Impression(s) / ED Diagnoses Final diagnoses:  Bipolar 1 disorder [facility])    Rx / DC Orders  Plan inpatient psychiatric admission  Devoria Albe, MD, Concha Pyo, MD 05/19/20 [phone]

## 2020-05-19 NOTE — BH Assessment (Signed)
Tele Assessment Note   Patient Name: Craig [name] MRN: [id number] Referring Physician: Dr. Devoria [name] Location of Patient: [facility] Location of Provider: Behavioral Health TTS Department  Craig [name] is an 59 y.o. male.  -Clinician reviewed note by Craig [name].  Patient presents to the emergency department for unclear reason, he states he is here "for the Revolution", however during the course of triage and during my exam patient has had  difficulty focusing on one topic, he moves freely between topics and is constantly talking.  He denied having any acute medical problems to me.  However when I left the room he asked me if he was going to get a shot, and when I asked him shot for what he states Demerol and Percocet.  Later on he is at the door talking to himself and states that he is having bronchitis.  He states "they took away my Prison inhaler".  Patient does not have suicidal or homicidal ideation.  He initially told me he takes no medication he then stated that "a nurse gives me my lithium every day", he then adds that he is on BuSpar, gabapentin, Tylenol PM and a muscle relaxer 0.5 mg at bedtime.  He denies drinking alcohol to me but then he states he makes something homemade that he will share with [place].  When this clinician started seeing patient, patient was already talking loudly to passersby in the ED.  When he noticed that the teleassessment machine was on, he started talking "at" clinician.  Patient showed off the necklace he said his sister gave him.    He then starts being verbally aggressive telling this clinician he needed to brush his teeth and that he could smell him.  Patient goes on to tell clinician he wants to cut his head off, etc.  Patient does not answer questions directly.  His speech is non stop and is incoherent.  Patient is easily distracted to what is going on.  When asked if he wanted to hurt himself he said "you"  When asked if he heard voices or saw things he said "all  the time."  Patient was asked who brought him to [facility] and he said "you" and goes on to talk about other things that make no coherent sense.    Pt eye contact is good but he is not oriented.  Patient is responding to internal stimuli.  His thought process is incoherent and irrelevant.  Patient not able to make sound decisions for himself at this time.    Pt was given Geodon 10mg  IM at 00:37.    Pt likely has no outpatient care.  Last inpatient care was at [name] in 09/2007.    -Clinician discussed patient care with 10/2007, NP who recommends inpatient psychiatric care.    Diagnosis: Bipolar 1 d/o most recent episode manic, severe  Past Medical History:  Past Medical History:  Diagnosis Date  . Bipolar 1 disorder (HCC)   . COPD (chronic obstructive pulmonary disease) (HCC)   . PTSD (post-traumatic stress disorder)   . Stroke [facility])    left-sided weakness, around 2008    Past Surgical History:  Procedure Laterality Date  . APPENDECTOMY    . BACK SURGERY    . CHOLECYSTECTOMY N/A 02/28/2017   Procedure: LAPAROSCOPIC CHOLECYSTECTOMY, possible open;  Surgeon: 03/02/2017, MD;  Location: [facility] ORS;  Service: General;  Laterality: N/A;    Family History:  Family History  Problem Relation Age of Onset  . Colon  cancer Brother        diagnosed around age 36   . Pancreatic cancer Neg Hx   . Pancreatic disease Neg Hx     Social History:  reports that he has quit smoking. His smoking use included cigarettes. He has never used smokeless tobacco. He reports that he does not drink alcohol and does not use drugs.  Additional Social History:  Alcohol / Drug Use Pain Medications: Pt unable to provide information. Prescriptions: Pt unable to provide information Over the Counter: Pt unable to provide information History of alcohol / drug use?: Yes Substance #1 Name of Substance 1: ETOH 1 - Age of Craig Use: unknown 1 - Amount (size/oz): unknown 1 - Frequency: unknown 1 - Duration:  unknown 1 - Last Use / Amount: 12/12 Pt says he had two beers.  CIWA: CIWA-Ar BP: (!) 146/61 Pulse Rate: 94 COWS:    Allergies: No Known Allergies  Home Medications: (Not in a hospital admission)   OB/GYN Status:  No LMP for male patient.  General Assessment Data Location of Assessment: [facility] TTS Assessment: In system Is this a Tele or Face-to-Face Assessment?: Tele Assessment Is this an Initial Assessment or a Re-assessment for this encounter?: Initial Assessment Patient Accompanied by:: N/A Language Other than English: No Living Arrangements: Other (Comment) (Unknown) What gender do you identify as?: Male Date Telepsych consult ordered in [facility]: 05/18/20 Time Telepsych consult ordered in [facility]: 2325 Marital status: Single Pregnancy Status: No Living Arrangements: Other (Comment) (Unknown) Can pt return to current living arrangement?: Yes Admission Status: Voluntary Is patient capable of signing voluntary admission?: No (AMS) Referral Source: Other (Brought in by [place] [employer].) Insurance type: self pay     Crisis Care Plan Living Arrangements: Other (Comment) (Unknown) Name of Psychiatrist: None Name of Therapist: None  Education Status Is patient currently in school?: No Is the patient employed, unemployed or receiving disability?: Unemployed  Risk to self with the past 6 months Suicidal Ideation: No Has patient been a risk to self within the past 6 months prior to admission? : No Suicidal Intent: No Has patient had any suicidal intent within the past 6 months prior to admission? : No Is patient at risk for suicide?: No Suicidal Plan?: No Has patient had any suicidal plan within the past 6 months prior to admission? : No Access to Means: No What has been your use of drugs/alcohol within the last 12 months?: ETOH Previous Attempts/Gestures:  (Unknown) How many times?:  (Unknown) Other Self Harm Risks: Unknown Triggers for Past Attempts: Unknown Intentional  Self Injurious Behavior: None Family Suicide History: Unknown Recent stressful life event(s): Turmoil (Comment) (Pt may have gotten out of jail recently) Persecutory voices/beliefs?: Yes Depression:  (Unknown) Depression Symptoms: Feeling angry/irritable Substance abuse history and/or treatment for substance abuse?:  (Unknown) Suicide prevention information given to non-admitted patients: Not applicable  Risk to Others within the past 6 months Homicidal Ideation: Yes-Currently Present (Wanted to cut off clinician's head.) Does patient have any lifetime risk of violence toward others beyond the six months prior to admission? : Yes (comment) (Pt with chronic mental illness which puts him at rist of physical harm by others.) Thoughts of Harm to Others: Yes-Currently Present Comment - Thoughts of Harm to Others: Pt talks about hitting others. Current Homicidal Intent: No (Unknown) Current Homicidal Plan:  (Unknown) Access to Homicidal Means:  (Unknown) Identified Victim: Unknown History of harm to others?:  (Unknown) Assessment of Violence:  (Unknown) Violent Behavior Description: Unknown Does  patient have access to weapons?: No Criminal Charges Pending?:  (Unknown) Does patient have a court date:  (Unknown) Is patient on probation?: Unknown  Psychosis Hallucinations: Auditory ("all the time:) Delusions:  (None)  Mental Status Report Appearance/Hygiene: Disheveled Eye Contact: Good Motor Activity: Freedom of movement,Restlessness,Hyperactivity,Agitation Speech: Aggressive,Incoherent,Rapid,Loud,Word salad Level of Consciousness: Alert,Irritable,Restless Mood: Anxious,Irritable,Threatening Affect: Anxious,Irritable,Threatening Anxiety Level: Moderate Thought Processes: Irrelevant,Flight of Ideas Judgement: Impaired Orientation: Unable to assess Obsessive Compulsive Thoughts/Behaviors: Unable to Assess  Cognitive Functioning Concentration: Poor Memory: Unable to Assess Is  patient IDD:  (Unknown) Insight: Unable to Assess Impulse Control: Poor Appetite:  (Unknown) Have you had any weight changes? : No Change Sleep: Unable to Assess Total Hours of Sleep:  (Unknown) Vegetative Symptoms: Unable to Assess  ADLScreening [facility] Assessment Services) Patient's cognitive ability adequate to safely complete daily activities?: Yes Patient able to express need for assistance with ADLs?: Yes Independently performs ADLs?: Yes (appropriate for developmental age)  Prior Inpatient Therapy Prior Inpatient Therapy: Yes Prior Therapy Dates: 09/2007 Prior Therapy Facilty/Provider(s): [name] Reason for Treatment: SA  Prior Outpatient Therapy Prior Outpatient Therapy: No Does patient have an ACCT team?: No Does patient have [facility] services? : No Does patient have P4CC services?: No  ADL Screening (condition at time of admission) Patient's cognitive ability adequate to safely complete daily activities?: Yes Is the patient deaf or have difficulty hearing?: No Does the patient have difficulty seeing, even when wearing glasses/contacts?: No Does the patient have difficulty concentrating, remembering, or making decisions?: Yes Patient able to express need for assistance with ADLs?: Yes Does the patient have difficulty dressing or bathing?:  (Unknown) Independently performs ADLs?: Yes (appropriate for developmental age) Does the patient have difficulty walking or climbing stairs?: No Weakness of Legs: None Weakness of Arms/Hands: None       Abuse/Neglect Assessment (Assessment to be complete while patient is alone) Abuse/Neglect Assessment Can Be Completed: Yes Physical Abuse:  (Unknown) Verbal Abuse:  (Unknown) Sexual Abuse:  (Unknown)                Disposition:  Disposition Initial Assessment Completed for this Encounter: Yes Patient referred to: Other (Comment) (CSW to assist with placement)  This service was provided via telemedicine using a 2-way,  interactive audio and video technology.  Names of all persons participating in this telemedicine service and their role in this encounter. Name: Craig [name] Role: patient  Name: Beatriz Stallion, M.S. LCAS QP Role: clinician  Name:  Role:   Name:  Role:     Alexandria Lodge 05/19/2020 1:12 AM

## 2020-05-19 NOTE — BH Assessment (Signed)
Clinician discussed patient with [facility] Aliene Altes.  There are no appropriate beds at [facility] at this time.  Clinician informed Dr. Lynelle Doctor of patient being recommended for inpatient.

## 2020-05-20 ENCOUNTER — Inpatient Hospital Stay (HOSPITAL_COMMUNITY)
Admission: AD | Admit: 2020-05-20 | Discharge: 2020-05-30 | DRG: 885 | Disposition: A | Discharge status: Home / Self Care | Payer: Medicare Other | Source: Intra-hospital | Attending: Psychiatry | Admitting: Psychiatry

## 2020-05-20 ENCOUNTER — Encounter (HOSPITAL_COMMUNITY): Payer: Self-pay | Admitting: Behavioral Health

## 2020-05-20 ENCOUNTER — Other Ambulatory Visit: Payer: Self-pay | Admitting: Psychiatric/Mental Health

## 2020-05-20 ENCOUNTER — Other Ambulatory Visit: Payer: Self-pay

## 2020-05-20 DIAGNOSIS — F312 Bipolar disorder, current episode manic severe with psychotic features: Secondary | ICD-10-CM | POA: Diagnosis present

## 2020-05-20 DIAGNOSIS — G4700 Insomnia, unspecified: Secondary | ICD-10-CM | POA: Diagnosis present

## 2020-05-20 DIAGNOSIS — F4310 Post-traumatic stress disorder, unspecified: Secondary | ICD-10-CM | POA: Diagnosis present

## 2020-05-20 DIAGNOSIS — F17210 Nicotine dependence, cigarettes, uncomplicated: Secondary | ICD-10-CM | POA: Diagnosis present

## 2020-05-20 DIAGNOSIS — Z23 Encounter for immunization: Secondary | ICD-10-CM

## 2020-05-20 DIAGNOSIS — M40209 Unspecified kyphosis, site unspecified: Secondary | ICD-10-CM | POA: Diagnosis present

## 2020-05-20 DIAGNOSIS — Z79899 Other long term (current) drug therapy: Secondary | ICD-10-CM

## 2020-05-20 DIAGNOSIS — J449 Chronic obstructive pulmonary disease, unspecified: Secondary | ICD-10-CM | POA: Diagnosis present

## 2020-05-20 DIAGNOSIS — Z720 Tobacco use: Secondary | ICD-10-CM

## 2020-05-20 MED ORDER — GABAPENTIN 300 MG PO CAPS
300.0000 mg | ORAL_CAPSULE | Freq: Two times a day (BID) | ORAL | Status: DC
  Administered 2020-05-20 – 2020-05-30 (×20): 300 mg via ORAL
  Filled 2020-05-20 (×24): qty 1

## 2020-05-20 MED ORDER — INFLUENZA VAC SPLIT QUAD 0.5 ML IM SUSY
0.5000 mL | PREFILLED_SYRINGE | INTRAMUSCULAR | Status: AC
  Administered 2020-05-21: 14:00:00 0.5 mL via INTRAMUSCULAR
  Filled 2020-05-20: qty 0.5

## 2020-05-20 MED ORDER — PNEUMOCOCCAL VAC POLYVALENT 25 MCG/0.5ML IJ INJ
0.5000 mL | INJECTION | INTRAMUSCULAR | Status: AC
  Administered 2020-05-21: 14:00:00 0.5 mL via INTRAMUSCULAR
  Filled 2020-05-20: qty 0.5

## 2020-05-20 MED ORDER — OLANZAPINE 5 MG PO TBDP
5.0000 mg | ORAL_TABLET | Freq: Three times a day (TID) | ORAL | Status: DC | PRN
Start: 2020-05-20 — End: 2020-05-30
  Administered 2020-05-22 – 2020-05-27 (×7): 5 mg via ORAL
  Filled 2020-05-20 (×7): qty 1

## 2020-05-20 MED ORDER — HYDROXYZINE HCL 25 MG PO TABS
25.0000 mg | ORAL_TABLET | Freq: Three times a day (TID) | ORAL | Status: DC | PRN
  Administered 2020-05-21 – 2020-05-29 (×15): 25 mg via ORAL
  Filled 2020-05-20 (×10): qty 1
  Filled 2020-05-20 (×2): qty 10
  Filled 2020-05-20 (×7): qty 1
  Filled 2020-05-20: qty 10

## 2020-05-20 MED ORDER — ACETAMINOPHEN 325 MG PO TABS
650.0000 mg | ORAL_TABLET | Freq: Four times a day (QID) | ORAL | Status: DC | PRN
Start: 2020-05-20 — End: 2020-05-30
  Administered 2020-05-21 – 2020-05-29 (×10): 650 mg via ORAL
  Filled 2020-05-20 (×10): qty 2

## 2020-05-20 MED ORDER — LORAZEPAM 1 MG PO TABS
1.0000 mg | ORAL_TABLET | ORAL | Status: AC | PRN
  Administered 2020-05-24: 1 mg via ORAL
  Filled 2020-05-20: qty 1

## 2020-05-20 MED ORDER — ALUM & MAG HYDROXIDE-SIMETH 200-200-20 MG/5ML PO SUSP: 30.0000 mL | ORAL | Status: DC | PRN

## 2020-05-20 MED ORDER — MAGNESIUM HYDROXIDE 400 MG/5ML PO SUSP: 30.0000 mL | Freq: Every day | ORAL | Status: DC | PRN

## 2020-05-20 MED ORDER — TRAZODONE HCL 50 MG PO TABS
50.0000 mg | ORAL_TABLET | Freq: Every evening | ORAL | Status: DC | PRN
  Administered 2020-05-21 – 2020-05-30 (×13): 50 mg via ORAL
  Filled 2020-05-20 (×12): qty 1

## 2020-05-20 MED ORDER — ALBUTEROL SULFATE HFA 108 (90 BASE) MCG/ACT IN AERS
1.0000 | INHALATION_SPRAY | Freq: Four times a day (QID) | RESPIRATORY_TRACT | Status: DC | PRN
  Administered 2020-05-22 – 2020-05-29 (×11): 2 via RESPIRATORY_TRACT
  Filled 2020-05-20: qty 6.7

## 2020-05-20 MED ORDER — LITHIUM CARBONATE ER 300 MG PO TBCR
300.0000 mg | EXTENDED_RELEASE_TABLET | Freq: Every day | ORAL | Status: DC
  Administered 2020-05-20 – 2020-05-23 (×4): 300 mg via ORAL
  Filled 2020-05-20 (×7): qty 1

## 2020-05-20 MED ORDER — ZIPRASIDONE MESYLATE 20 MG IM SOLR
20.0000 mg | INTRAMUSCULAR | Status: AC | PRN
  Administered 2020-05-24: 03:00:00 20 mg via INTRAMUSCULAR
  Filled 2020-05-20: qty 20

## 2020-05-20 MED ORDER — TIZANIDINE HCL 4 MG PO TABS
4.0000 mg | ORAL_TABLET | Freq: Once | ORAL | Status: AC
  Administered 2020-05-20: 22:00:00 4 mg via ORAL
  Filled 2020-05-20: qty 2
  Filled 2020-05-20: qty 1

## 2020-05-20 MED ORDER — NICOTINE 14 MG/24HR TD PT24
14.0000 mg | MEDICATED_PATCH | Freq: Every day | TRANSDERMAL | Status: DC
  Administered 2020-05-21 – 2020-05-30 (×9): 14 mg via TRANSDERMAL
  Filled 2020-05-20 (×13): qty 1

## 2020-05-20 NOTE — ED Notes (Signed)
This pt. Denies suicidal thoughts but when asked if they have thoughts of harming others pt. Stated " No comment."

## 2020-05-20 NOTE — Tx Team (Signed)
Initial Treatment Plan 05/20/2020 10:14 PM [name] QQI:[id number]    PATIENT STRESSORS: Other: pt endorses no stressors   PATIENT STRENGTHS: Average or above average intelligence Supportive family/friends   PATIENT IDENTIFIED PROBLEMS: Psychosis ("brought to hospital by transport beam")  Bipolar 1  "pt wants to work on maintenance of himself-mind, body and soul"                 DISCHARGE CRITERIA:  Improved stabilization in mood, thinking, and/or behavior Verbal commitment to aftercare and medication compliance  PRELIMINARY DISCHARGE PLAN: Outpatient therapy Return to previous living arrangement  PATIENT/FAMILY INVOLVEMENT: This treatment plan has been presented to and reviewed with the patient, Craig [name], and/or family member.  The patient and family have been given the opportunity to ask questions and make suggestions.  Victorino December, RN 05/20/2020, 10:14 PM

## 2020-05-20 NOTE — Progress Notes (Signed)
Patient ID: Craig [name], male   DOB: [birth year], 59 y.o.   MRN: [id number]   D: Pt here voluntarily from [facility]. Pt denies SI/HI/AVH at this time. Pt endorses pain 8/10 as chronic pain in his lower back. Pt says he is here because "a transport beam brought me to the hospital." Pt is pleasant but a poor historian. One moment he says he smokes marijuana and then the next, "I don't know nothing about marijuana." Pt endorses taking Lithium, Buspar, Zanaflex, Gabapentin and an Albuterol inhaler. Pt lives in a group home in [place], since being released from jail November of 2020. Pt endorses weakness in his arms and legs from more than one stroke in the past. Pt states that he wants to work on maintenance of himself while he is here. "I want to work on maintenance of me-mind, body and soul."  A: Pt was offered support and encouragement. Pt is cooperative during assessment. VS assessed and admission paperwork signed. Belongings searched and contraband items placed in locker. Non-invasive skin search completed: pt has tattoos on R shoulder, midline scar on back and R flank scar from appendectomy. Pt offered food and drink and both accepted. Pt introduced to unit milieu by nursing staff. Q 15 minute checks were started for safety.   R: Pt in room eating. Pt safety maintained on unit.

## 2020-05-20 NOTE — Progress Notes (Signed)
   05/20/20 2200  Psych Admission Type (Psych Patients Only)  Admission Status Voluntary  Psychosocial Assessment  Patient Complaints None  Eye Contact Brief  Facial Expression Animated  Affect Appropriate to circumstance  Speech Loud;Logical/coherent  Interaction Assertive  Motor Activity Slow  Appearance/Hygiene In scrubs  Behavior Characteristics Cooperative;Restless  Mood Preoccupied;Pleasant  Thought Process  Coherency Loose associations;Concrete thinking  Content WDL  Delusions None reported or observed  Perception WDL  Hallucination None reported or observed  Judgment Limited  Confusion Mild  Danger to Self  Current suicidal ideation? Denies  Danger to Others  Danger to Others None reported or observed

## 2020-05-20 NOTE — Progress Notes (Signed)
Pt accepted to [facility], bed 401-1     Elenore Paddy, NP is the accepting provider.    Dr. Jola Babinski is the attending provider.    Call report to [phone]    Katie @ [facility] ED notified.     Pt is voluntary and will be transported by [employer]  Pt is scheduled to arrive at [facility] after 8pm.    Wells Guiles, MSW, LCSW, LCAS Clinical Social Worker II Disposition CSW [phone]

## 2020-05-20 NOTE — ED Notes (Signed)
Accepted to [facility]. Can go after 8 pm today.

## 2020-05-20 NOTE — ED Provider Notes (Signed)
Emergency Medicine Observation Re-evaluation Note  RUE TINNEL is a 59 y.o. male, seen on rounds today.  Pt initially presented to the ED for complaints of Medical Clearance Currently, the patient is pending inpatient treatment..  Physical Exam  BP 113/71 (BP Location: Right Arm)    Pulse 64    Temp 98.1 F (36.7 C) (Oral)    Resp 20    Ht 5\' 6"  (1.676 m)    Wt 95.7 kg    SpO2 98%    BMI 34.05 kg/m  Physical Exam Patient is awake and standing at the door and his sunglasses.  Observing the activity of the ER.  Appears to be in no distress.  ED Course / MDM  EKG:    I have reviewed the labs performed to date as well as medications administered while in observation.  Recent changes in the last 24 hours include patient has been somewhat more calm..  Plan  Current plan is for inpatient treatment.. Patient is not under full IVC at this time.   , MD 05/20/20 [phone]

## 2020-05-20 NOTE — ED Notes (Signed)
Pt states " I am a holy man. I am the chosen one. I spoke in tongues and asked my momma if they understand and she said yes. Momma said I was not crazy. Momma didn't deny that I am the chosen one."

## 2020-05-21 DIAGNOSIS — F312 Bipolar disorder, current episode manic severe with psychotic features: Principal | ICD-10-CM

## 2020-05-21 LAB — HEMOGLOBIN A1C
Hgb A1c MFr Bld: 5.4 % (ref 4.8–5.6)
Mean Plasma Glucose: 108.28 mg/dL

## 2020-05-21 LAB — TSH: TSH: 1.386 u[IU]/mL (ref 0.350–4.500)

## 2020-05-21 LAB — LIPID PANEL
Cholesterol: 148 mg/dL (ref 0–200)
HDL: 49 mg/dL (ref >40)
LDL Cholesterol: 83 mg/dL (ref 0–99)
Total CHOL/HDL Ratio: 3 RATIO
Triglycerides: 80 mg/dL (ref <150)
VLDL: 16 mg/dL (ref 0–40)

## 2020-05-21 MED ORDER — OLANZAPINE 5 MG PO TABS
5.0000 mg | ORAL_TABLET | ORAL | Status: AC
  Administered 2020-05-21: 12:00:00 5 mg via ORAL
  Filled 2020-05-21 (×2): qty 1

## 2020-05-21 NOTE — BHH Suicide Risk Assessment (Signed)
[facility] Admission Suicide Risk Assessment   Nursing information obtained from:  Patient Demographic factors:  Male,Caucasian,Low socioeconomic status,Unemployed,Divorced or widowed Current Mental Status:  NA Loss Factors:  NA Historical Factors:  NA Risk Reduction Factors:  NA  Total Time spent with patient: 30 minutes Principal Problem: <principal problem not specified> Diagnosis:  Active Problems:   Severe manic bipolar 1 disorder with psychotic behavior (HCC)  Subjective Data: Patient is seen and examined.  Patient is a 59 year old male with a reported past psychiatric history significant for bipolar disorder who originally presented to the [facility] emergency department on 05/19/2020 stating that he was there for "the revolution".  On examination today the patient stated that he had self reduced his lithium dosage.  He stated that while he was in prison his dosage had been decreased, but he was unable to drink 3 to 6 glasses of water a day to keep the lithium working well.  Initially he stated that he had gone to a family practice doctor to continue his medications, but then he stated that he was to be seen in January.  He stated he had not seen a psychiatrist since he was released from prison in September of this year.  He stated the only medicine that he was for sure that he took in the past was lithium, but he did recognize the names BuSpar, gabapentin.  He apparently has been staying at a group home near [facility].  In the emergency room at [facility] he became agitated and required Geodon 10 mg IM x1.  He told me he let me, appears to be euphoric, is pressured, poor historian and tangential.  He was admitted to the hospital for evaluation and stabilization.  Continued Clinical Symptoms:  Alcohol Use Disorder Identification Test Final Score (AUDIT): 1 The "Alcohol Use Disorders Identification Test", Guidelines for Use in Primary Care, Second Edition.  World Science writer  [facility]). Score between 0-7:  no or low risk or alcohol related problems. Score between 8-15:  moderate risk of alcohol related problems. Score between 16-19:  high risk of alcohol related problems. Score 20 or above:  warrants further diagnostic evaluation for alcohol dependence and treatment.   CLINICAL FACTORS:   Bipolar Disorder:   Mixed State   Musculoskeletal: Strength & Muscle Tone: within normal limits Gait & Station: normal Patient leans: N/A  Psychiatric Specialty Exam: Physical Exam Vitals and nursing note reviewed.  HENT:     Head: Normocephalic and atraumatic.  Pulmonary:     Effort: Pulmonary effort is normal.  Neurological:     General: No focal deficit present.     Mental Status: He is alert and oriented to person, place, and time.     Review of Systems  Blood pressure (!) 118/91, pulse 78, temperature 98.1 F (36.7 C), temperature source Oral, resp. rate 16, height 5\' 6"  (1.676 m), weight 82.1 kg, SpO2 98 %.Body mass index is 29.21 kg/m.  General Appearance: Disheveled  Eye Contact:  Good  Speech:  Pressured  Volume:  Increased  Mood:  Euphoric  Affect:  Labile  Thought Process:  Disorganized and Descriptions of Associations: Tangential  Orientation:  Full (Time, Place, and Person)  Thought Content:  Tangential  Suicidal Thoughts:  No  Homicidal Thoughts:  No  Memory:  Immediate;   Poor Recent;   Poor Remote;   Poor  Judgement:  Impaired  Insight:  Lacking  Psychomotor Activity:  Increased  Concentration:  Concentration: Poor and Attention Span: Poor  Recall:  Poor  Fund of Knowledge:  Poor  Language:  Good  Akathisia:  Negative  Handed:  Right  AIMS (if indicated):     Assets:  Desire for Improvement Resilience  ADL's:  Intact  Cognition:  WNL  Sleep:         COGNITIVE FEATURES THAT CONTRIBUTE TO RISK:  None    SUICIDE RISK:   Moderate:  Frequent suicidal ideation with limited intensity, and duration, some specificity in terms of  plans, no associated intent, good self-control, limited dysphoria/symptomatology, some risk factors present, and identifiable protective factors, including available and accessible social support.  PLAN OF CARE: Patient is seen and examined.  Patient is a 59 year old male with a suspected past psychiatric history significant for bipolar disorder who was admitted secondary to exacerbation of his bipolar symptoms.  He will be admitted to the hospital.  He will be introduced to the milieu.  He will be encouraged to attend groups.  Review of the electronic medical record at least appeared that his lithium dosage is reportedly at 300 mg p.o. twice daily.  That order is already been ordered, and he has been started on that.  Given the significance of his mania at this point review of the electronic medical record several years ago revealed that he had received Zyprexa in the past without any allergic reactions or difficulties.  We will go on and give him 5 mg now to try is slowing down a bit.  He is also on albuterol for COPD, gabapentin for either anxiety, chronic pain or mood stability.  He apparently had requested muscle relaxants at the [facility] emergency room, and apparently received 4 mg of Zanaflex x1 now.  We will write for Flexeril 5 mg p.o. 3 times daily as needed muscle spasms.  Review of his admission laboratories revealed essentially normal electrolytes including liver function enzymes.  His lipid panel was normal.  CBC was normal.  His lithium level on 12/13 was 0.20.  Acetaminophen and salicylate were both essentially negative.  Hemoglobin A1c was 5.4.  TSH was 1.386.  Blood alcohol was less than 10.  Drug screen was positive for marijuana.  His EKG at [facility] was abnormal, but is of poor quality.  We will repeat that this morning.  I certify that inpatient services furnished can reasonably be expected to improve the patient's condition.   Antonieta Pert, MD 05/21/2020, 9:51 AM

## 2020-05-21 NOTE — Tx Team (Signed)
Interdisciplinary Treatment and Diagnostic Plan Update  05/21/2020 Time of Session: 9:15am Craig [name] MRN: [id number]  Principal Diagnosis: <principal problem not specified>  Secondary Diagnoses: Active Problems:   Severe manic bipolar 1 disorder with psychotic behavior ([place])   Current Medications:  Current Facility-Administered Medications  Medication Dose Route Frequency Provider Last Rate Last Admin  . acetaminophen (TYLENOL) tablet 650 mg  650 mg Oral Q6H PRN Connye Burkitt, NP   650 mg at 05/21/20 4193  . albuterol (VENTOLIN HFA) 108 (90 Base) MCG/ACT inhaler 1-2 puff  1-2 puff Inhalation Q6H PRN Connye Burkitt, NP      . alum & mag hydroxide-simeth (MAALOX/MYLANTA) 200-200-20 MG/5ML suspension 30 mL  30 mL Oral Q4H PRN Connye Burkitt, NP      . gabapentin (NEURONTIN) capsule 300 mg  300 mg Oral BID Connye Burkitt, NP   300 mg at 05/21/20 7902  . hydrOXYzine (ATARAX/VISTARIL) tablet 25 mg  25 mg Oral TID PRN Connye Burkitt, NP   25 mg at 05/21/20 0114  . influenza vac split quadrivalent PF (FLUARIX) injection 0.5 mL  0.5 mL Intramuscular Tomorrow-1000 Lindon Romp A, NP      . lithium carbonate (LITHOBID) CR tablet 300 mg  300 mg Oral QHS Connye Burkitt, NP   300 mg at 05/20/20 2221  . OLANZapine zydis (ZYPREXA) disintegrating tablet 5 mg  5 mg Oral Q8H PRN Connye Burkitt, NP       And  . LORazepam (ATIVAN) tablet 1 mg  1 mg Oral PRN Connye Burkitt, NP       And  . ziprasidone (GEODON) injection 20 mg  20 mg Intramuscular PRN Connye Burkitt, NP      . magnesium hydroxide (MILK OF MAGNESIA) suspension 30 mL  30 mL Oral Daily PRN Connye Burkitt, NP      . nicotine (NICODERM CQ - dosed in mg/24 hours) patch 14 mg  14 mg Transdermal Daily Lindon Romp A, NP   14 mg at 05/21/20 0819  . OLANZapine (ZYPREXA) tablet 5 mg  5 mg Oral NOW Mallie Darting Cordie Grice, MD      . pneumococcal 23 valent vaccine (PNEUMOVAX-23) injection 0.5 mL  0.5 mL Intramuscular Tomorrow-1000 Lindon Romp A, NP       . traZODone (DESYREL) tablet 50 mg  50 mg Oral QHS PRN,MR X 1 Connye Burkitt, NP   50 mg at 05/21/20 0114   PTA Medications: Medications Prior to Admission  Medication Sig Dispense Refill Last Dose  . busPIRone (BUSPAR) 15 MG tablet Take 15 mg by mouth 2 (two) times daily.     Marland Kitchen gabapentin (NEURONTIN) 300 MG capsule One tablet bid (Patient taking differently: Take 300 mg by mouth 2 (two) times daily. One tablet bid) 60 capsule 0   . lithium 300 MG tablet Take 1 tablet by mouth at bedtime.     Marland Kitchen tiZANidine (ZANAFLEX) 4 MG tablet Take 4 mg by mouth at bedtime.     Marland Kitchen albuterol (PROVENTIL HFA;VENTOLIN HFA) 108 (90 Base) MCG/ACT inhaler Inhale 2 puffs into the lungs every 6 (six) hours as needed for wheezing or shortness of breath. 1 Inhaler 2   . ipratropium-albuterol (DUONEB) 0.5-2.5 (3) MG/3ML SOLN Take 3 mLs by nebulization every 4 (four) hours as needed. 360 mL 1   . levothyroxine (SYNTHROID) 100 MCG tablet Take 100 mcg by mouth daily.     . pantoprazole (PROTONIX) 40 MG tablet Take 1 tablet (  40 mg total) by mouth daily before breakfast. (Patient not taking: Reported on 05/20/2020) 30 tablet 1   . potassium chloride SA (K-DUR,KLOR-CON) 20 MEQ tablet Take 1 tablet (20 mEq total) by mouth 2 (two) times daily. (Patient not taking: Reported on 05/20/2020) 60 tablet 1     Patient Stressors: Other: pt endorses no stressors  Patient Strengths: Average or above average intelligence Supportive family/friends  Treatment Modalities: Medication Management, Group therapy, Case management,  1 to 1 session with clinician, Psychoeducation, Recreational therapy.   Physician Treatment Plan for Primary Diagnosis: <principal problem not specified> Long Term Goal(s):     Short Term Goals:    Medication Management: Evaluate patient's response, side effects, and tolerance of medication regimen.  Therapeutic Interventions: 1 to 1 sessions, Unit Group sessions and Medication administration.  Evaluation of  Outcomes: Not Met  Physician Treatment Plan for Secondary Diagnosis: Active Problems:   Severe manic bipolar 1 disorder with psychotic behavior ([place])  Long Term Goal(s):     Short Term Goals:       Medication Management: Evaluate patient's response, side effects, and tolerance of medication regimen.  Therapeutic Interventions: 1 to 1 sessions, Unit Group sessions and Medication administration.  Evaluation of Outcomes: Not Met   RN Treatment Plan for Primary Diagnosis: <principal problem not specified> Long Term Goal(s): Knowledge of disease and therapeutic regimen to maintain health will improve  Short Term Goals: Ability to remain free from injury will improve, Ability to verbalize frustration and anger appropriately will improve, Ability to demonstrate self-control, Ability to participate in decision making will improve, Ability to identify and develop effective coping behaviors will improve and Compliance with prescribed medications will improve  Medication Management: RN will administer medications as ordered by provider, will assess and evaluate patient's response and provide education to patient for prescribed medication. RN will report any adverse and/or side effects to prescribing provider.  Therapeutic Interventions: 1 on 1 counseling sessions, Psychoeducation, Medication administration, Evaluate responses to treatment, Monitor vital signs and CBGs as ordered, Perform/monitor CIWA, COWS, AIMS and Fall Risk screenings as ordered, Perform wound care treatments as ordered.  Evaluation of Outcomes: Not Met   LCSW Treatment Plan for Primary Diagnosis: <principal problem not specified> Long Term Goal(s): Safe transition to appropriate next level of care at discharge, Engage patient in therapeutic group addressing interpersonal concerns.  Short Term Goals: Engage patient in aftercare planning with referrals and resources, Increase social support, Increase ability to appropriately  verbalize feelings, Increase emotional regulation, Identify triggers associated with mental health/substance abuse issues and Increase skills for wellness and recovery  Therapeutic Interventions: Assess for all discharge needs, 1 to 1 time with Social worker, Explore available resources and support systems, Assess for adequacy in community support network, Educate family and significant other(s) on suicide prevention, Complete Psychosocial Assessment, Interpersonal group therapy.  Evaluation of Outcomes: Not Met   Progress in Treatment: Attending groups: No. Participating in groups: No. Taking medication as prescribed: Yes. Toleration medication: Yes. Family/Significant other contact made: No, will contact:  if consent is provided Patient understands diagnosis: No. Discussing patient identified problems/goals with staff: Yes. Medical problems stabilized or resolved: Yes. Denies suicidal/homicidal ideation: Yes. Issues/concerns per patient self-inventory: No.   New problem(s) identified: No, Describe:  none  New Short Term/Long Term Goal(s): medication stabilization, elimination of SI thoughts, development of comprehensive mental wellness plan.   Patient Goals:  "To be in the middle of the [place]"  Discharge Plan or Barriers: Patient recently admitted. CSW will  continue to follow and assess for appropriate referrals and possible discharge planning.   Reason for Continuation of Hospitalization: Delusions  Hallucinations Mania Medication stabilization  Estimated Length of Stay: 3-5 days  Attendees: Patient: Craig [name] 05/21/2020   Physician: Lala Lund, MD 05/21/2020   Nursing:  05/21/2020   RN Care Manager: 05/21/2020  Social Worker: Darletta Moll, LCSW 05/21/2020   Recreational Therapist:  05/21/2020   Other:  05/21/2020  Other:  05/21/2020   Other: 05/21/2020       Scribe for Treatment Team: Vassie Moselle, LCSW 05/21/2020 10:38 AM

## 2020-05-21 NOTE — Progress Notes (Signed)
   05/21/20 0604  Vital Signs  Pulse Rate 78  BP (!) 118/91  BP Method Automatic  Patient Position (if appropriate) Standing  Oxygen Therapy  SpO2 98 %   D: Patient denies SI/HI/AVH. Patient denies anxiety and depression. Pt out in open areas and was social with peers and staff.  A:  Patient took scheduled medicine. Pt took 650 mg of Tylenol for RA pain. Pt. Reported some relief.  Support and encouragement provided Routine safety checks conducted every 15 minutes. Patient  Informed to notify staff with any concerns.  R:  Safety maintained.

## 2020-05-21 NOTE — Progress Notes (Signed)
Recreation Therapy Notes  Date:  12.15.21 Time: 0930 Location: [address] Dayroom  Group Topic: Stress Management  Goal Area(s) Addresses:  Patient will identify positive stress management techniques. Patient will identify benefits of using stress management post d/c.  Intervention: Stress Management  Activity: Meditation.  LRT played a meditation that focused on being able to self sooth when dealing with things such as anger, anxiety, depression, etc.  The meditation focused on taking the time for yourself you need to calm your emotions.    Education:  Stress Management, Discharge Planning.   Education Outcome: Acknowledges Education  Clinical Observations/Feedback: Pt did not attend group activity.    Caroll Rancher, LRT/CTRS         Lillia Abed, Irasema Chalk A 05/21/2020 10:05 AM

## 2020-05-21 NOTE — H&P (Signed)
Psychiatric Admission Assessment Adult  Patient Identification: Kathrin GreathouseBobby D Imel MRN:  [id number] Date of Evaluation:  05/21/2020 Chief Complaint:  Severe manic bipolar 1 disorder with psychotic behavior (HCC) [F31.2] Principal Diagnosis: <principal problem not specified> Diagnosis:  Active Problems:   Severe manic bipolar 1 disorder with psychotic behavior (HCC)  History of Present Illness: Patient is seen and examined.  Patient is a 59 year old male with a reported past psychiatric history significant for bipolar disorder who originally presented to the [facility] emergency department on 05/19/2020 stating that he was there for "the revolution".  On examination today the patient stated that he had self reduced his lithium dosage.  He stated that while he was in prison his dosage had been decreased, but he was unable to drink 3 to 6 glasses of water a day to keep the lithium working well.  Initially he stated that he had gone to a family practice doctor to continue his medications, but then he stated that he was to be seen in January.  He stated he had not seen a psychiatrist since he was released from prison in September of this year.  He stated the only medicine that he was for sure that he took in the past was lithium, but he did recognize the names BuSpar, gabapentin.  He apparently has been staying at a group home near [facility].  In the emergency room [facility] he became agitated and required Geodon 10 mg IM x1.  He told me he let me, appears to be euphoric, is pressured, poor historian and tangential.  He was admitted to the hospital for evaluation and stabilization.  Associated Signs/Symptoms: Depression Symptoms:  insomnia, psychomotor agitation, fatigue, anxiety, disturbed sleep, Duration of Depression Symptoms: No data recorded (Hypo) Manic Symptoms:  Delusions, Distractibility, Grandiosity, Impulsivity, Irritable Mood, Labiality of Mood, Sexually Inapproprite  Behavior, Anxiety Symptoms:  Excessive Worry, Psychotic Symptoms:  Denied Duration of Psychotic Symptoms: No data recorded PTSD Symptoms: Had a traumatic exposure:  In the past Total Time spent with patient: 45 minutes  Past Psychiatric History: Over the last several years the patient has been treated since he has been incarcerated.  Prior to that his last psychiatric hospitalization within our system was at the behavioral health hospital in 2009.  There were several emergency room visits in 2013 and 14 for bipolar disorder as well as suicidal ideation.  He does appear he was referred out for those admissions.  He has been previously treated with Seroquel, Depakote, lithium, gabapentin, Zyprexa.  Is the patient at risk to self? Yes.    Has the patient been a risk to self in the past 6 months? No.  Has the patient been a risk to self within the distant past? Yes.    Is the patient a risk to others? Yes.    Has the patient been a risk to others in the past 6 months? Yes.    Has the patient been a risk to others within the distant past? Yes.     Prior Inpatient Therapy:   Prior Outpatient Therapy:    Alcohol Screening: 1. How often do you have a drink containing alcohol?: Monthly or less 2. How many drinks containing alcohol do you have on a typical day when you are drinking?: 1 or 2 3. How often do you have six or more drinks on one occasion?: Never AUDIT-C Score: 1 9. Have you or someone else been injured as a result of your drinking?: No 10. Has  a relative or friend or a doctor or another health worker been concerned about your drinking or suggested you cut down?: No Alcohol Use Disorder Identification Test Final Score (AUDIT): 1 Alcohol Brief Interventions/Follow-up: AUDIT Score <7 follow-up not indicated Substance Abuse History in the last 12 months:  Yes.   Consequences of Substance Abuse: Negative Previous Psychotropic Medications: Yes  Psychological Evaluations: Yes  Past  Medical History:  Past Medical History:  Diagnosis Date  . Bipolar 1 disorder (HCC)   . COPD (chronic obstructive pulmonary disease) (HCC)   . PTSD (post-traumatic stress disorder)   . Stroke [facility])    left-sided weakness, around 2008    Past Surgical History:  Procedure Laterality Date  . APPENDECTOMY    . BACK SURGERY    . CHOLECYSTECTOMY N/A 02/28/2017   Procedure: LAPAROSCOPIC CHOLECYSTECTOMY, possible open;  Surgeon: Lucretia Roers, MD;  Location: [facility];  Service: General;  Laterality: N/A;   Family History:  Family History  Problem Relation Age of Onset  . Colon cancer Brother        diagnosed around age 70   . Pancreatic cancer Neg Hx   . Pancreatic disease Neg Hx    Family Psychiatric  History: Unknown Tobacco Screening: Have you used any form of tobacco in the last 30 days? (Cigarettes, Smokeless Tobacco, Cigars, and/or Pipes): Yes Tobacco use, Select all that apply: 5 or more cigarettes per day Are you interested in Tobacco Cessation Medications?: Yes, will notify MD for an order Counseled patient on smoking cessation including recognizing danger situations, developing coping skills and basic information about quitting provided: Refused/Declined practical counseling Social History:  Social History   Substance and Sexual Activity  Alcohol Use No  . Alcohol/week: 1.0 standard drink  . Types: 1 Glasses of wine per week   Comment: occ     Social History   Substance and Sexual Activity  Drug Use No    Additional Social History:                           Allergies:  No Known Allergies Lab Results:  Results for orders placed or performed during the hospital encounter of 05/20/20 (from the past 48 hour(s))  TSH     Status: None   Collection Time: 05/21/20  6:44 AM  Result Value Ref Range   TSH 1.386 0.350 - 4.500 uIU/mL    Comment: Performed by a 3rd Generation assay with a functional sensitivity of <=0.01 uIU/mL. Performed at [facility], [address], [place], [place] [address]   Hemoglobin A1c     Status: None   Collection Time: 05/21/20  6:44 AM  Result Value Ref Range   Hgb A1c MFr Bld 5.4 4.8 - 5.6 %    Comment: (NOTE) Pre diabetes:          5.7%-6.4%  Diabetes:              >6.4%  Glycemic control for   <7.0% adults with diabetes    Mean Plasma Glucose 108.28 mg/dL    Comment: Performed at [facility], [address], [place], [place] [address]  Lipid panel     Status: None   Collection Time: 05/21/20  6:44 AM  Result Value Ref Range   Cholesterol 148 0 - 200 mg/dL   Triglycerides 80 <144 mg/dL   HDL 49 >81 mg/dL   Total CHOL/HDL Ratio 3.0 RATIO   VLDL  16 0 - 40 mg/dL   LDL Cholesterol 83 0 - 99 mg/dL    Comment:        Total Cholesterol/HDL:CHD Risk Coronary Heart Disease Risk Table                     Men   Women  1/2 Average Risk   3.4   3.3  Average Risk       5.0   4.4  2 X Average Risk   9.6   7.1  3 X Average Risk  23.4   11.0        Use the calculated Patient Ratio above and the CHD Risk Table to determine the patient's CHD Risk.        ATP III CLASSIFICATION (LDL):  <100     mg/dL   Optimal  076-226  mg/dL   Near or Above                    Optimal  130-159  mg/dL   Borderline  333-545  mg/dL   High  >625     mg/dL   Very High Performed at [facility], [address] [place], [place] [address]     Blood Alcohol level:  Lab Results  Component Value Date   [facility] <10 05/19/2020   ETH <11 02/09/2013    Metabolic Disorder Labs:  Lab Results  Component Value Date   HGBA1C 5.4 05/21/2020   MPG 108.28 05/21/2020   No results found for: PROLACTIN Lab Results  Component Value Date   CHOL 148 05/21/2020   TRIG 80 05/21/2020   HDL 49 05/21/2020   CHOLHDL 3.0 05/21/2020   VLDL 16 05/21/2020   LDLCALC 83 05/21/2020   LDLCALC 67 02/23/2017    Current Medications: Current Facility-Administered Medications  Medication Dose Route  Frequency Provider Last Rate Last Admin  . acetaminophen (TYLENOL) tablet 650 mg  650 mg Oral Q6H PRN Aldean Baker, NP   650 mg at 05/21/20 7342  . albuterol (VENTOLIN HFA) 108 (90 Base) MCG/ACT inhaler 1-2 puff  1-2 puff Inhalation Q6H PRN Aldean Baker, NP      . alum & mag hydroxide-simeth (MAALOX/MYLANTA) 200-200-20 MG/5ML suspension 30 mL  30 mL Oral Q4H PRN Aldean Baker, NP      . gabapentin (NEURONTIN) capsule 300 mg  300 mg Oral BID Aldean Baker, NP   300 mg at 05/21/20 8768  . hydrOXYzine (ATARAX/VISTARIL) tablet 25 mg  25 mg Oral TID PRN Aldean Baker, NP   25 mg at 05/21/20 0114  . influenza vac split quadrivalent PF (FLUARIX) injection 0.5 mL  0.5 mL Intramuscular Tomorrow-1000 Nira Conn A, NP      . lithium carbonate (LITHOBID) CR tablet 300 mg  300 mg Oral QHS Aldean Baker, NP   300 mg at 05/20/20 2221  . OLANZapine zydis (ZYPREXA) disintegrating tablet 5 mg  5 mg Oral Q8H PRN Aldean Baker, NP       And  . LORazepam (ATIVAN) tablet 1 mg  1 mg Oral PRN Aldean Baker, NP       And  . ziprasidone (GEODON) injection 20 mg  20 mg Intramuscular PRN Aldean Baker, NP      . magnesium hydroxide (MILK OF MAGNESIA) suspension 30 mL  30 mL Oral Daily PRN Aldean Baker, NP      . nicotine (NICODERM CQ - dosed in  mg/24 hours) patch 14 mg  14 mg Transdermal Daily Nira Conn A, NP   14 mg at 05/21/20 0819  . pneumococcal 23 valent vaccine (PNEUMOVAX-23) injection 0.5 mL  0.5 mL Intramuscular Tomorrow-1000 Nira Conn A, NP      . traZODone (DESYREL) tablet 50 mg  50 mg Oral QHS PRN,MR X 1 Aldean Baker, NP   50 mg at 05/21/20 0114   PTA Medications: Medications Prior to Admission  Medication Sig Dispense Refill Last Dose  . busPIRone (BUSPAR) 15 MG tablet Take 15 mg by mouth 2 (two) times daily.     Marland Kitchen gabapentin (NEURONTIN) 300 MG capsule One tablet bid (Patient taking differently: Take 300 mg by mouth 2 (two) times daily. One tablet bid) 60 capsule 0   . lithium 300 MG  tablet Take 1 tablet by mouth at bedtime.     Marland Kitchen tiZANidine (ZANAFLEX) 4 MG tablet Take 4 mg by mouth at bedtime.     Marland Kitchen albuterol (PROVENTIL HFA;VENTOLIN HFA) 108 (90 Base) MCG/ACT inhaler Inhale 2 puffs into the lungs every 6 (six) hours as needed for wheezing or shortness of breath. 1 Inhaler 2   . ipratropium-albuterol (DUONEB) 0.5-2.5 (3) MG/3ML SOLN Take 3 mLs by nebulization every 4 (four) hours as needed. 360 mL 1   . levothyroxine (SYNTHROID) 100 MCG tablet Take 100 mcg by mouth daily.     . pantoprazole (PROTONIX) 40 MG tablet Take 1 tablet (40 mg total) by mouth daily before breakfast. (Patient not taking: Reported on 05/20/2020) 30 tablet 1   . potassium chloride SA (K-DUR,KLOR-CON) 20 MEQ tablet Take 1 tablet (20 mEq total) by mouth 2 (two) times daily. (Patient not taking: Reported on 05/20/2020) 60 tablet 1     Musculoskeletal: Strength & Muscle Tone: within normal limits Gait & Station: normal Patient leans: N/A  Psychiatric Specialty Exam: Physical Exam Vitals and nursing note reviewed.  HENT:     Head: Normocephalic and atraumatic.  Pulmonary:     Effort: Pulmonary effort is normal.  Neurological:     General: No focal deficit present.     Mental Status: He is alert and oriented to person, place, and time.     Review of Systems  Blood pressure (!) 118/91, pulse 78, temperature 98.1 F (36.7 C), temperature source Oral, resp. rate 16, height  (1.676 m), weight 82.1 kg, SpO2 98 %.Body mass index is 29.21 kg/m.  General Appearance: Disheveled  Eye Contact:  Good  Speech:  Pressured  Volume:  Increased  Mood:  Euphoric  Affect:  Congruent  Thought Process:  Coherent and Descriptions of Associations: Tangential  Orientation:  Full (Time, Place, and Person)  Thought Content:  Tangential  Suicidal Thoughts:  No  Homicidal Thoughts:  No  Memory:  Immediate;   Poor Recent;   Poor Remote;   Poor  Judgement:  Impaired  Insight:  Lacking  Psychomotor Activity:   Increased  Concentration:  Concentration: Poor  Recall:  Poor  Fund of Knowledge:  Poor  Language:  Good  Akathisia:  Negative  Handed:  Right  AIMS (if indicated):     Assets:  Desire for Improvement Resilience  ADL's:  Intact  Cognition:  WNL  Sleep:       Treatment Plan Summary: Daily contact with patient to assess and evaluate symptoms and progress in treatment, Medication management and Plan :  Patient is seen and examined.  Patient is a 59 year old male with a suspected past psychiatric history significant  for bipolar disorder who was admitted secondary to exacerbation of his bipolar symptoms.  He will be admitted to the hospital.  He will be introduced to the milieu.  He will be encouraged to attend groups.  Review of the electronic medical record at least appeared that his lithium dosage is reportedly at 300 mg p.o. twice daily.  That order is already been ordered, and he has been started on that.  Given the significance of his mania at this point review of the electronic medical record several years ago revealed that he had received Zyprexa in the past without any allergic reactions or difficulties.  We will go on and give him 5 mg now to try is slowing down a bit.  He is also on albuterol for COPD, gabapentin for either anxiety, chronic pain or mood stability.  He apparently had requested muscle relaxants at the [facility] emergency room, and apparently received 4 mg of Zanaflex x1 now.  We will write for Flexeril 5 mg p.o. 3 times daily as needed muscle spasms.  Review of his admission laboratories revealed essentially normal electrolytes including liver function enzymes.  His lipid panel was normal.  CBC was normal.  His lithium level on 12/13 was 0.20.  Acetaminophen and salicylate were both essentially negative.  Hemoglobin A1c was 5.4.  TSH was 1.386.  Blood alcohol was less than 10.  Drug screen was positive for marijuana.  His EKG at [facility] was abnormal, but is of poor quality.   We will repeat that this morning.  Observation Level/Precautions:  15 minute checks Seizure  Laboratory:  Chemistry Profile  Psychotherapy:    Medications:    Consultations:    Discharge Concerns:    Estimated LOS:  Other:     Physician Treatment Plan for Primary Diagnosis: <principal problem not specified> Long Term Goal(s): Improvement in symptoms so as ready for discharge  Short Term Goals: Ability to identify changes in lifestyle to reduce recurrence of condition will improve, Ability to verbalize feelings will improve, Ability to demonstrate self-control will improve, Ability to identify and develop effective coping behaviors will improve, Ability to maintain clinical measurements within normal limits will improve, Compliance with prescribed medications will improve and Ability to identify triggers associated with substance abuse/mental health issues will improve  Physician Treatment Plan for Secondary Diagnosis: Active Problems:   Severe manic bipolar 1 disorder with psychotic behavior (HCC)  Long Term Goal(s): Improvement in symptoms so as ready for discharge  Short Term Goals: Ability to identify changes in lifestyle to reduce recurrence of condition will improve, Ability to verbalize feelings will improve, Ability to demonstrate self-control will improve, Ability to identify and develop effective coping behaviors will improve, Ability to maintain clinical measurements within normal limits will improve, Compliance with prescribed medications will improve and Ability to identify triggers associated with substance abuse/mental health issues will improve  I certify that inpatient services furnished can reasonably be expected to improve the patient's condition.    Antonieta Pert, MD 12/15/20211:13 PM

## 2020-05-22 MED ORDER — CYCLOBENZAPRINE HCL 10 MG PO TABS
5.0000 mg | ORAL_TABLET | Freq: Three times a day (TID) | ORAL | Status: DC | PRN
  Administered 2020-05-22 – 2020-05-26 (×8): 5 mg via ORAL
  Filled 2020-05-22 (×8): qty 1

## 2020-05-22 NOTE — [facility] Group Notes (Signed)
Patient did not attend morning orientation group although he was made aware of it.   

## 2020-05-22 NOTE — [facility] Group Notes (Signed)
Adult Psychoeducational Group Note  Date:  05/22/2020 Time:  4:40 PM  Group Topic/Focus:  Goals Group:   The focus of this group is to help patients establish daily goals to achieve during treatment and discuss how the patient can incorporate goal setting into their daily lives to aide in recovery.  Participation Level:  Active  Participation Quality:  Appropriate  Affect:  Appropriate  Cognitive:  Appropriate  Insight: Appropriate  Engagement in Group:  Engaged  Modes of Intervention:  Discussion  Additional Comments:  Patient attended Goal and Coping with Mental Health Crises Group and participated.   Shakil Dirk W Aristide Waggle 05/22/2020, 4:40 PM

## 2020-05-22 NOTE — Progress Notes (Signed)
   05/22/20 0000  Psych Admission Type (Psych Patients Only)  Admission Status Voluntary  Psychosocial Assessment  Patient Complaints None  Eye Contact Fair  Facial Expression Animated  Affect Appropriate to circumstance  Speech Loud;Logical/coherent  Interaction Assertive  Motor Activity Slow  Appearance/Hygiene In scrubs  Behavior Characteristics Cooperative  Mood Silly;Pleasant  Thought Process  Coherency Loose associations;Concrete thinking  Content WDL  Delusions None reported or observed  Perception WDL  Hallucination None reported or observed  Judgment Limited  Confusion Mild  Danger to Self  Current suicidal ideation? Denies  Danger to Others  Danger to Others None reported or observed

## 2020-05-22 NOTE — Progress Notes (Signed)
Pt denied SI/HI/AVH. Pt participating in groups and interacting with peers appropriately.  RN provided support and assessed for needs/concerns.  Pt took medications as prescribed and no adverse reactions were noted.  Pt remains safe on unit with q 15 min checks in place.

## 2020-05-22 NOTE — Progress Notes (Signed)
Pt given night time medications, pt up in his room appears to be talking to people not seen and agitated at times. Pt given PRN Zyprexa per [facility]

## 2020-05-22 NOTE — Progress Notes (Signed)
Patient did not attend morning orientation group although he was made aware of it.
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
## 2020-05-22 NOTE — Progress Notes (Addendum)
[facility] MD Progress Note  05/22/2020 12:31 PM Craig [name]  MRN:  [id number] Subjective: Craig [name] remains compliant with his medications. Patient does not report any negative side effects. Patient reports that his sleep was poor last night due to his back pain. Patient is very manic and has flight of ideas while talking, but at some point mentions that his kyphosis is bothering him.   Patient reports that his appetite is "not good" because he has put himself on a diet so that he "doesn't need that walker" again because too much weight worsens his back pain. Patient contracts for safety and does not endorse AVH. 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 Patient also reports that he making a Christmas tree for the unit in his room and that he saving things from the cafeteria and he will bring it to the desk for everyone to see when it is done.  Principal Problem: <principal problem not specified> Diagnosis: Active Problems:   Severe manic bipolar 1 disorder with psychotic behavior (HCC)  Total Time spent with patient: 15 minutes  Past Psychiatric History: See H&P  Past Medical History:  Past Medical History:  Diagnosis Date  . Bipolar 1 disorder (HCC)   . COPD (chronic obstructive pulmonary disease) (HCC)   . PTSD (post-traumatic stress disorder)   . Stroke [facility])    left-sided weakness, around 2008    Past Surgical History:  Procedure Laterality Date  . APPENDECTOMY    . BACK SURGERY    . CHOLECYSTECTOMY N/A 02/28/2017   Procedure: LAPAROSCOPIC CHOLECYSTECTOMY, possible open;  Surgeon: Lucretia Roers, MD;  Location: [facility];  Service: General;  Laterality: N/A;   Family History:  Family History  Problem Relation Age of Onset  . Colon cancer Brother        diagnosed around age 74   . Pancreatic cancer Neg Hx   . Pancreatic disease Neg Hx    Family Psychiatric  History: See H&P Social History:  Social History    Substance and Sexual Activity  Alcohol Use No  . Alcohol/week: 1.0 standard drink  . Types: 1 Glasses of wine per week   Comment: occ     Social History   Substance and Sexual Activity  Drug Use No    Social History   Socioeconomic History  . Marital status: Widowed    Spouse name: Not on file  . Number of children: Not on file  . Years of education: Not on file  . Highest education level: Not on file  Occupational History  . Not on file  Tobacco Use  . Smoking status: Current Every Day Smoker    Packs/day: 0.50    Types: Cigarettes  . Smokeless tobacco: Never Used  . Tobacco comment: last smoke yesterday  Vaping Use  . Vaping Use: Former  Substance and Sexual Activity  . Alcohol use: No    Alcohol/week: 1.0 standard drink    Types: 1 Glasses of wine per week    Comment: occ  . Drug use: No  . Sexual activity: Yes  Other Topics Concern  . Not on file  Social History Narrative  . Not on file   Social Determinants of Health   Financial Resource Strain: Not on file  Food Insecurity: Not on file  Transportation Needs: Not on file  Physical Activity: Not on file  Stress: Not on file  Social Connections: Not on file   Additional  Social History:                         Sleep: Poor  Appetite:  Poor  Current Medications: Current Facility-Administered Medications  Medication Dose Route Frequency Provider Last Rate Last Admin  . acetaminophen (TYLENOL) tablet 650 mg  650 mg Oral Q6H PRN Aldean BakerSykes, Janet E, NP   650 mg at 05/21/20 [id number]  . albuterol (VENTOLIN HFA) 108 (90 Base) MCG/ACT inhaler 1-2 puff  1-2 puff Inhalation Q6H PRN Aldean BakerSykes, Janet E, NP      . alum & mag hydroxide-simeth (MAALOX/MYLANTA) 200-200-20 MG/5ML suspension 30 mL  30 mL Oral Q4H PRN Aldean BakerSykes, Janet E, NP      . gabapentin (NEURONTIN) capsule 300 mg  300 mg Oral BID Aldean BakerSykes, Janet E, NP   300 mg at 05/22/20 0901  . hydrOXYzine (ATARAX/VISTARIL) tablet 25 mg  25 mg Oral TID PRN Aldean BakerSykes, Janet  E, NP   25 mg at 05/22/20 0901  . lithium carbonate (LITHOBID) CR tablet 300 mg  300 mg Oral QHS Aldean BakerSykes, Janet E, NP   300 mg at 05/21/20 2136  . OLANZapine zydis (ZYPREXA) disintegrating tablet 5 mg  5 mg Oral Q8H PRN Aldean BakerSykes, Janet E, NP   5 mg at 05/22/20 0109   And  . LORazepam (ATIVAN) tablet 1 mg  1 mg Oral PRN Aldean BakerSykes, Janet E, NP       And  . ziprasidone (GEODON) injection 20 mg  20 mg Intramuscular PRN Aldean BakerSykes, Janet E, NP      . magnesium hydroxide (MILK OF MAGNESIA) suspension 30 mL  30 mL Oral Daily PRN Aldean BakerSykes, Janet E, NP      . nicotine (NICODERM CQ - dosed in mg/24 hours) patch 14 mg  14 mg Transdermal Daily Nira ConnBerry, Jason A, NP   14 mg at 05/22/20 0904  . traZODone (DESYREL) tablet 50 mg  50 mg Oral QHS PRN,MR X 1 Aldean BakerSykes, Janet E, NP   50 mg at 05/22/20 [id number]    Lab Results:  Results for orders placed or performed during the hospital encounter of 05/20/20 (from the past 48 hour(s))  TSH     Status: None   Collection Time: 05/21/20  6:44 AM  Result Value Ref Range   TSH 1.386 0.350 - 4.500 uIU/mL    Comment: Performed by a 3rd Generation assay with a functional sensitivity of <=0.01 uIU/mL. Performed at [facility], [address] [place], [place] [address]   Hemoglobin A1c     Status: None   Collection Time: 05/21/20  6:44 AM  Result Value Ref Range   Hgb A1c MFr Bld 5.4 4.8 - 5.6 %    Comment: (NOTE) Pre diabetes:          5.7%-6.4%  Diabetes:              >6.4%  Glycemic control for   <7.0% adults with diabetes    Mean Plasma Glucose 108.28 mg/dL    Comment: Performed at [facility], [address] [place], [place] [address]  Lipid panel     Status: None   Collection Time: 05/21/20  6:44 AM  Result Value Ref Range   Cholesterol 148 0 - 200 mg/dL   Triglycerides 80 <629<150 mg/dL   HDL 49 >52>40 mg/dL   Total CHOL/HDL Ratio 3.0 RATIO   VLDL 16 0 - 40 mg/dL   LDL Cholesterol 83 0 - 99 mg/dL  Comment:        Total Cholesterol/HDL:CHD  Risk Coronary Heart Disease Risk Table                     Men   Women  1/2 Average Risk   3.4   3.3  Average Risk       5.0   4.4  2 X Average Risk   9.6   7.1  3 X Average Risk  23.4   11.0        Use the calculated Patient Ratio above and the CHD Risk Table to determine the patient's CHD Risk.        ATP III CLASSIFICATION (LDL):  <100     mg/dL   Optimal  915-056  mg/dL   Near or Above                    Optimal  130-159  mg/dL   Borderline  979-480  mg/dL   High  >165     mg/dL   Very High Performed at [facility], [address], [place], [place] [address]     Blood Alcohol level:  Lab Results  Component Value Date   [facility] <10 05/19/2020   ETH <11 02/09/2013    Metabolic Disorder Labs: Lab Results  Component Value Date   HGBA1C 5.4 05/21/2020   MPG 108.28 05/21/2020   No results found for: PROLACTIN Lab Results  Component Value Date   CHOL 148 05/21/2020   TRIG 80 05/21/2020   HDL 49 05/21/2020   CHOLHDL 3.0 05/21/2020   VLDL 16 05/21/2020   LDLCALC 83 05/21/2020   LDLCALC 67 02/23/2017    Physical Findings: AIMS:  , ,  ,  ,    CIWA:    COWS:     Musculoskeletal: Strength & Muscle Tone: within normal limits Gait & Station: normal Patient leans: Front  Psychiatric Specialty Exam: Physical Exam HENT:     Head: Normocephalic.  Pulmonary:     Effort: Pulmonary effort is normal.  Musculoskeletal:     Comments: Severe kyphosis  Neurological:     Mental Status: He is alert.     Review of Systems  Blood pressure 121/79, pulse 92, temperature 98.3 F (36.8 C), temperature source Oral, resp. rate 16, height 5\' 6"  (1.676 m), weight 82.1 kg, SpO2 98 %.Body mass index is 29.21 kg/m.  General Appearance: Fairly Groomed  Eye Contact:  Fair  Speech:  Pressured  Volume:  Increased  Mood:  Euphoric  Affect:  Congruent  Thought Process:  Disorganized  Orientation:  NA  Thought Content:  Delusions  Suicidal Thoughts:  No   Homicidal Thoughts:  No  Memory:  NA  Judgement:  Impaired  Insight:  Shallow patient does take his medications. He is not constantly stating he is Bipolar today.  Psychomotor Activity:  Increased  Concentration:  Concentration: Poor  Recall:  NA  Fund of Knowledge:  NA  Language:  Fair  Akathisia:  No  Handed:  Right  AIMS (if indicated):     Assets:  Resilience  ADL's:  Intact  Cognition:  Impaired,  Mild  Sleep:        Treatment Plan Summary: Daily contact with patient to assess and evaluate symptoms and progress in treatment Craig [name] is a 59 year old male with a suspected past psychiatric history significant for bipolar disorder who was admitted secondary to exacerbation of his bipolar symptoms. Patient did require 5mg   Zyprexa overnight.Patient continues to be manic as he is still delusional and has flight of ideas with disorganized speech. Patient is also reporting working on a project at the moment in his room. Patient continues to need time for his Lithium to reach therapeutic level. Patient was voicing some back pain in the mix of his disorganized speech and thought. Patient does have muscle relaxant PRN. Patient is doing his best to attend group.  Bipolar 1 disorder, manic episode, current - Gabapentin 300 BID -Lithium 300mg  QHS  Tobacco use disorder - Nicotine 14mg  patch  Severe kyphosis  - Flexeril 5mg  TID PRN, for muscle spasms  PRN -Tylenol 650mg  q6h - Albuterol inhalers -Maalox 44ml q4h -Atarax 25mg  TID, anxiety  Agitation protocol - Zyprexa 5mg  - Ativan1mg   - Geodon 20mg  IM  -Milk of Mag 72mL -Trazodone 50mg x 1 QHS  PGY-1 , MD 05/22/2020, 12:31 PM

## 2020-05-23 MED ORDER — OLANZAPINE 2.5 MG PO TABS
2.5000 mg | ORAL_TABLET | Freq: Every day | ORAL | Status: DC
  Administered 2020-05-23: 21:00:00 2.5 mg via ORAL
  Filled 2020-05-23 (×2): qty 1

## 2020-05-23 NOTE — [facility] Group Notes (Signed)
[facility] LCSW Group Therapy  05/23/2020 1:54 PM  Type of Therapy:  Connecting with Your Peers  Participation Level:  Active  Participation Quality:  Intrusive and Sharing  Affect:  Excited and Intrusive   Cognitive:  Disorganized and Delusional  Insight:  None  Engagement in Therapy:  Limited  Modes of Intervention:  Activity and Discussion  Summary of Progress/Problems: Craig [name] attended group and remained there the entire time.  Craig [name] stated that he is a people person and that he is in search of love in his life.  Craig [name] also states that he would like to achieve eternal life.  Craig [name] was intrusive and disorganized.  Craig [name] interrupted his peers and attempted to make inappropriate jokes. Craig [name] required frequent redirection.   [name] 05/23/2020, 1:54 PM

## 2020-05-23 NOTE — Progress Notes (Signed)
   05/23/20 0930  Psych Admission Type (Psych Patients Only)  Admission Status Voluntary  Psychosocial Assessment  Patient Complaints Anxiety  Eye Contact Fair  Facial Expression Animated  Affect Anxious;Blunted  Speech Loud;Logical/coherent  Interaction Assertive  Motor Activity Slow  Appearance/Hygiene In scrubs  Behavior Characteristics Appropriate to situation;Cooperative;Anxious;Fidgety  Mood Depressed;Anxious  Thought Process  Coherency Loose associations;Concrete thinking  Content WDL  Delusions None reported or observed  Perception WDL  Hallucination None reported or observed  Judgment Limited  Confusion WDL  Danger to Self  Current suicidal ideation? Denies  Danger to Others  Danger to Others None reported or observed  Pt presents with tangential speech at times.  Pt has been calm and cooperative this shift.  Pt took medications without incident. Pt remains safe on unit with q 15 min checks in place.

## 2020-05-23 NOTE — Progress Notes (Signed)
[facility] MD Progress Note  05/23/2020 1:31 PM NASEEM VARDEN  MRN:  [id number] Subjective:  Patient remains compliant with his medication. Patient reports that he slept "great!" and his mood is "greeeaaattt!" Today patient reports that his appetite is also "great, I'm eating like a horse!" Patient then shows Clinical research associate that he a system for his room cleaning that he is currently doing and also has "Christmas trees" he made for the general area made out of trash. Patient then sits down and tells Clinical research associate that he has many questions for Retail banker and begins asking random questions that have nothing to do with care. Writer asked patient if he had been talking to someone or something in his room as multiple staff at witnessed the patient having VH. Patient reports that he seeing "critters" like "squirrels" taking his food, patient is not able to talk specifically about people he is seeing but Clinical research associate later saw patient yelling into air with a towel sword he made. Patient does not endorse SI nor HI. Patient does contract for safety.  Principal Problem: <principal problem not specified> Diagnosis: Active Problems:   Severe manic bipolar 1 disorder with psychotic behavior (HCC)  Total Time spent with patient: 15 minutes  Past Psychiatric History: See H&P  Past Medical History:  Past Medical History:  Diagnosis Date  . Bipolar 1 disorder (HCC)   . COPD (chronic obstructive pulmonary disease) (HCC)   . PTSD (post-traumatic stress disorder)   . Stroke [facility])    left-sided weakness, around 2008    Past Surgical History:  Procedure Laterality Date  . APPENDECTOMY    . BACK SURGERY    . CHOLECYSTECTOMY N/A 02/28/2017   Procedure: LAPAROSCOPIC CHOLECYSTECTOMY, possible open;  Surgeon: Lucretia Roers, MD;  Location: [facility];  Service: General;  Laterality: N/A;   Family History:  Family History  Problem Relation Age of Onset  . Colon cancer Brother        diagnosed around age 22   . Pancreatic cancer Neg Hx    . Pancreatic disease Neg Hx    Family Psychiatric  History: See H&P Social History:  Social History   Substance and Sexual Activity  Alcohol Use No  . Alcohol/week: 1.0 standard drink  . Types: 1 Glasses of wine per week   Comment: occ     Social History   Substance and Sexual Activity  Drug Use No    Social History   Socioeconomic History  . Marital status: Widowed    Spouse name: Not on file  . Number of children: Not on file  . Years of education: Not on file  . Highest education level: Not on file  Occupational History  . Not on file  Tobacco Use  . Smoking status: Current Every Day Smoker    Packs/day: 0.50    Types: Cigarettes  . Smokeless tobacco: Never Used  . Tobacco comment: last smoke yesterday  Vaping Use  . Vaping Use: Former  Substance and Sexual Activity  . Alcohol use: No    Alcohol/week: 1.0 standard drink    Types: 1 Glasses of wine per week    Comment: occ  . Drug use: No  . Sexual activity: Yes  Other Topics Concern  . Not on file  Social History Narrative  . Not on file   Social Determinants of Health   Financial Resource Strain: Not on file  Food Insecurity: Not on file  Transportation Needs: Not on file  Physical Activity: Not on file  Stress: Not on file  Social Connections: Not on file   Additional Social History:                         Sleep: Patietn reports that it was good,  Appetite:  Good  Current Medications: Current Facility-Administered Medications  Medication Dose Route Frequency Provider Last Rate Last Admin  . acetaminophen (TYLENOL) tablet 650 mg  650 mg Oral Q6H PRN Aldean Baker, NP   650 mg at 05/21/20 0962  . albuterol (VENTOLIN HFA) 108 (90 Base) MCG/ACT inhaler 1-2 puff  1-2 puff Inhalation Q6H PRN Aldean Baker, NP   2 puff at 05/23/20 [phone]  . alum & mag hydroxide-simeth (MAALOX/MYLANTA) 200-200-20 MG/5ML suspension 30 mL  30 mL Oral Q4H PRN Aldean Baker, NP      . cyclobenzaprine  (FLEXERIL) tablet 5 mg  5 mg Oral TID PRN Bobbye Morton, MD   5 mg at 05/23/20 0820  . gabapentin (NEURONTIN) capsule 300 mg  300 mg Oral BID Aldean Baker, NP   300 mg at 05/23/20 0816  . hydrOXYzine (ATARAX/VISTARIL) tablet 25 mg  25 mg Oral TID PRN Aldean Baker, NP   25 mg at 05/23/20 0820  . lithium carbonate (LITHOBID) CR tablet 300 mg  300 mg Oral QHS Aldean Baker, NP   300 mg at 05/22/20 2156  . OLANZapine zydis (ZYPREXA) disintegrating tablet 5 mg  5 mg Oral Q8H PRN Aldean Baker, NP   5 mg at 05/22/20 2238   And  . LORazepam (ATIVAN) tablet 1 mg  1 mg Oral PRN Aldean Baker, NP       And  . ziprasidone (GEODON) injection 20 mg  20 mg Intramuscular PRN Aldean Baker, NP      . magnesium hydroxide (MILK OF MAGNESIA) suspension 30 mL  30 mL Oral Daily PRN Aldean Baker, NP      . nicotine (NICODERM CQ - dosed in mg/24 hours) patch 14 mg  14 mg Transdermal Daily Nira Conn A, NP   14 mg at 05/23/20 0818  . OLANZapine (ZYPREXA) tablet 2.5 mg  2.5 mg Oral QHS Enaya Howze B, MD      . traZODone (DESYREL) tablet 50 mg  50 mg Oral QHS PRN,MR X 1 Aldean Baker, NP   50 mg at 05/22/20 2205    Lab Results: No results found for this or any previous visit (from the past 48 hour(s)).  Blood Alcohol level:  Lab Results  Component Value Date   ETH <10 05/19/2020   ETH <11 02/09/2013    Metabolic Disorder Labs: Lab Results  Component Value Date   HGBA1C 5.4 05/21/2020   MPG 108.28 05/21/2020   No results found for: PROLACTIN Lab Results  Component Value Date   CHOL 148 05/21/2020   TRIG 80 05/21/2020   HDL 49 05/21/2020   CHOLHDL 3.0 05/21/2020   VLDL 16 05/21/2020   LDLCALC 83 05/21/2020   LDLCALC 67 02/23/2017    Physical Findings: AIMS:  , ,  ,  ,    CIWA:    COWS:     Musculoskeletal: Strength & Muscle Tone: within normal limits Gait & Station: normal Patient leans: Front  Psychiatric Specialty Exam: Physical Exam HENT:     Head: Normocephalic and  atraumatic.  Pulmonary:     Effort: Pulmonary effort is normal.  Neurological:     Mental Status: He is  alert.     Review of Systems  Constitutional: Positive for appetite change.  Musculoskeletal: Positive for back pain.  Psychiatric/Behavioral: Positive for hallucinations.    Blood pressure 115/71, pulse 80, temperature 98.3 F (36.8 C), temperature source Oral, resp. rate 16, height 5\' 6"  (1.676 m), weight 82.1 kg, SpO2 98 %.Body mass index is 29.21 kg/m.  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  Speech:  Pressured  Volume:  Increased  Mood:  Euphoric  Affect:  Congruent  Thought Process:  Disorganized  Orientation:  NA  Thought Content:  Hallucinations: Auditory Visual  Suicidal Thoughts:  No  Homicidal Thoughts:  No  Memory:  Recent;   Fair, patient remembers who is doctor is and some distant detail but due to mania his thought is too disorganized to fully convey what he wants  Judgement:  Impaired  Insight:  Lacking  Psychomotor Activity:  Increased  Concentration:  Concentration: Poor  Recall:  NA  Fund of Knowledge:  Fair  Language:  Fair  Akathisia:  No  Handed:  Right  AIMS (if indicated):     Assets:  Leisure Time Resilience  ADL's:  Intact  Cognition:  Impaired,  Mild  Sleep:        Treatment Plan Summary: Daily contact with patient to assess and evaluate symptoms and progress in treatment  Mr. Lovejoy is a 59 year old male with a suspected past psychiatric history significant for bipolar disorder who was admitted secondary to exacerbation of his bipolar symptoms. Patient continues to be pleasantly manic. His increased activities do not bother other patient's. Patient is complaint with his medications; however due to his hallucinations will add 2.5mg  Zyprexa QHS. Wil also get Lithium levels, TSH, and BMP as patient is now on day 4 of his Lithium tx, will  adjust pending levels and patient clinical presentation. Goal is to get patient back to his baseline. Bipolar 1 disorder, manic episode, w/ pyschosis current - Gabapentin 300 BID -Lithium 300mg  QHS -2.5mg  Zyprexa QHS Labs: TSH, Li, BMP  Tobacco use disorder - Nicotine 14mg  patch  Severe kyphosis  - Flexeril 5mg  TID PRN, for muscle spasms  PRN -Tylenol 650mg  q6h - Albuterol inhalers -Maalox 54ml q4h -Atarax 25mg  TID, anxiety  Agitation protocol - Zyprexa 5mg  - Ativan1mg   - Geodon 20mg  IM  -Milk of Mag 30mL -Trazodone 50mg x 1 QHS PGY-1 , MD 05/23/2020, 1:31 PM

## 2020-05-23 NOTE — Progress Notes (Signed)
D: Patient presents with animated affect but is pleasant upon interaction and assessment. Patient can be intrusive in the milieu but is redirectable. Patient can be heard having conversations with himself and seen pacing in his room and the hallways. Patient can be tangential in conversation and makes loose associations. Patient is medication compliant at this time. Patient denies SI/HI at this time. Patient also denies AH/VH at this time. Patient contracts for safety.  A: Provided positive reinforcement and encouragement. Redirection of behavior.  R: Patient cooperative and receptive to efforts. Patient remains safe on the unit.  05/23/20 2058  Psych Admission Type (Psych Patients Only)  Admission Status Voluntary  Psychosocial Assessment  Patient Complaints None  Eye Contact Fair  Facial Expression Animated  Affect Anxious  Speech Logical/coherent;Loud  Interaction Assertive;Intrusive  Motor Activity Slow  Appearance/Hygiene Unremarkable  Behavior Characteristics Appropriate to situation;Cooperative  Mood Anxious;Pleasant  Thought Process  Coherency Tangential;Loose associations  Content WDL  Delusions None reported or observed  Perception WDL  Hallucination None reported or observed  Judgment Limited  Confusion WDL  Danger to Self  Current suicidal ideation? Denies  Danger to Others  Danger to Others None reported or observed

## 2020-05-23 NOTE — Progress Notes (Signed)
Pt stated his goal for the day was to be better than yesterday.

## 2020-05-23 NOTE — Progress Notes (Signed)
   05/23/20 0200  Psych Admission Type (Psych Patients Only)  Admission Status Voluntary  Psychosocial Assessment  Patient Complaints None  Eye Contact Fair  Facial Expression Animated  Affect Anxious;Blunted  Speech Loud;Logical/coherent  Interaction Assertive  Motor Activity Slow  Appearance/Hygiene In scrubs  Behavior Characteristics Cooperative  Mood Preoccupied  Thought Process  Coherency Loose associations;Concrete thinking  Content WDL  Delusions None reported or observed  Perception WDL  Hallucination None reported or observed  Judgment Limited  Confusion WDL  Danger to Self  Current suicidal ideation? Denies  Danger to Others  Danger to Others None reported or observed

## 2020-05-23 NOTE — Progress Notes (Signed)
Recreation Therapy Notes  Date:  12.17.21 Time: 0930 Location: 300 Hall Group Room  Group Topic: Stress Management  Goal Area(s) Addresses:  Patient will identify positive stress management techniques. Patient will identify benefits of using stress management post d/c.  Behavioral Response: Engaged  Intervention: Stress Management  Activity: Progressive Muscle Relaxation.  LRT led group in progressive muscle relaxation.  Patients were to tense their muscles but not to the point of strain and then release the tension.  This was done for each muscle group individually. If patients had any areas that were sore or injured they could skip those affected areas.  Patients were to listen and follow along as LRT read script.    Education:  Stress Management, Discharge Planning.   Education Outcome: Acknowledges Education  Clinical Observations/Feedback: Pt attended and participated in group activity.     Jessikah Dicker, LRT/CTRS         Martavia Tye A 05/23/2020 10:22 AM 

## 2020-05-24 LAB — BASIC METABOLIC PANEL
Anion gap: 10 (ref 5–15)
BUN: 16 mg/dL (ref 6–20)
CO2: 28 mmol/L (ref 22–32)
Calcium: 9.5 mg/dL (ref 8.9–10.3)
Chloride: 103 mmol/L (ref 98–111)
Creatinine, Ser: 0.75 mg/dL (ref 0.61–1.24)
GFR, Estimated: >60 mL/min (ref >60)
Glucose, Bld: 100 mg/dL — ABNORMAL HIGH (ref 70–99)
Potassium: 4.1 mmol/L (ref 3.5–5.1)
Sodium: 141 mmol/L (ref 135–145)

## 2020-05-24 LAB — TSH: TSH: 2.436 u[IU]/mL (ref 0.350–4.500)

## 2020-05-24 LAB — LITHIUM LEVEL: Lithium Lvl: 0.31 mmol/L — ABNORMAL LOW (ref 0.60–1.20)

## 2020-05-24 MED ORDER — OLANZAPINE 5 MG PO TBDP
5.0000 mg | ORAL_TABLET | ORAL | Status: AC
  Administered 2020-05-24: 08:00:00 5 mg via ORAL
  Filled 2020-05-24 (×2): qty 1

## 2020-05-24 MED ORDER — OLANZAPINE 10 MG PO TABS
10.0000 mg | ORAL_TABLET | Freq: Every day | ORAL | Status: DC
  Administered 2020-05-24: 21:00:00 10 mg via ORAL
  Filled 2020-05-24 (×3): qty 1

## 2020-05-24 MED ORDER — ZIPRASIDONE MESYLATE 20 MG IM SOLR
20.0000 mg | Freq: Two times a day (BID) | INTRAMUSCULAR | Status: DC | PRN
  Administered 2020-05-24 – 2020-05-25 (×2): 20 mg via INTRAMUSCULAR
  Filled 2020-05-24: qty 20

## 2020-05-24 MED ORDER — LORAZEPAM 1 MG PO TABS: 1.0000 mg | ORAL_TABLET | ORAL | Status: DC | PRN

## 2020-05-24 MED ORDER — OLANZAPINE 5 MG PO TABS
5.0000 mg | ORAL_TABLET | ORAL | Status: AC
  Administered 2020-05-24: 15:00:00 5 mg via ORAL
  Filled 2020-05-24: qty 1

## 2020-05-24 MED ORDER — LITHIUM CARBONATE ER 300 MG PO TBCR
300.0000 mg | EXTENDED_RELEASE_TABLET | Freq: Two times a day (BID) | ORAL | Status: DC
  Administered 2020-05-24 – 2020-05-25 (×3): 300 mg via ORAL
  Filled 2020-05-24 (×7): qty 1

## 2020-05-24 MED ORDER — ZIPRASIDONE MESYLATE 20 MG IM SOLR
INTRAMUSCULAR | Status: AC
  Filled 2020-05-24: qty 20

## 2020-05-24 NOTE — B[facility] Group Notes (Signed)
Adult Psychoeducational Group Note  Date:  05/24/2020 Time:  10:19 PM  Group Topic/Focus:  Wrap-Up Group:   The focus of this group is to help patients review their daily goal of treatment and discuss progress on daily workbooks.  Participation Level:  Active  Participation Quality:  Appropriate  Affect:  Appropriate  Cognitive:  Alert  Insight: Improving  Engagement in Group:  Engaged  Modes of Intervention:  Discussion  Additional Comments:    Pt rated his day as 19.  Pt stated that he had a great day today. He has met his goal for today and that his day was better than yesterday. Craig [name] A 05/24/2020, 10:19 PM

## 2020-05-24 NOTE — B[facility] Group Notes (Signed)
.  Psychoeducational Group Note    Date:05/24/2020 Time: 1300-1400    Life Skills:  A group where two lists are made. What people need and what are things that we do that are healthy. The lists are developed by the patients and it is explained that we often do the actions that are not healthy to get our list of needs met.   Purpose of Group: . The group focus' on teaching patients on how to identify their needs and how to develop the coping skills needed to get their needs met  Participation Level:  Active  Participation Quality:  Appropriate  Affect:  Appropriate  Cognitive:  Oriented  Insight:  Improving  Engagement in Group:  Engaged  Additional Comments:  Pt rates his energy as a 10+/10. Pt is intrusive and disruptive in the group. Several times patient required redirection and to be encouraged not to monopolize the group.  Craig [name]

## 2020-05-24 NOTE — Progress Notes (Signed)
DAR NOTE: Patient presents with manic behavior and anxious.  Denies suicidal thoughts, auditory and visual hallucinations.  Speech is pressured and tangential.  Described energy level as high with good concentration.  Maintained on routine safety checks.  Medications given as prescribed.  Support and encouragement offered as needed.  Attended group and participated.  Patient visible in milieu interacting with staff and peers.  Patient is safe on and off the unit.

## 2020-05-24 NOTE — BHH Group Notes (Signed)
 .  Psychoeducational Group Note  Date: 05-24-20 Time: 0900-1000    Goal Setting   Purpose of Group: This group helps to provide patients with the steps of setting a goal that is specific, measurable, attainable, realistic and time specific. A discussion on how we keep ourselves stuck with negative self talk.    Participation Level:  Active  Participation Quality:  Appropriate  Affect:  Appropriate  Cognitive:  Appropriate  Insight:  Improving  Engagement in Group:  Engaged  Additional Comments:  .Pt's goal is to remain sober today  [name]

## 2020-05-24 NOTE — Progress Notes (Signed)
[facility] MD Progress Note  05/24/2020 12:23 PM Craig [name]  MRN:  [id number] Subjective:   Craig [name] is a 59 yr old male who presented in a Manic state saying he was here for the [place]. PPHx is significant for multiple hospitalizations and ER visits, Bipolar, and PTSD.  On exam today he is pleasant but still manic. His thinking is disorganized and speech pressured. He states that he is [place] and has the globe in his hands. He would say unintelligible things as well due to how pressured he was. He reports no SI, HI, or AVH. He reports that his sleep and appetite are good, however, he is noted to have slept only 2.75 hours.  Principal Problem: Severe manic bipolar 1 disorder with psychotic behavior (HCC) Diagnosis: Principal Problem:   Severe manic bipolar 1 disorder with psychotic behavior (HCC)  Total Time spent with patient: 15 minutes  Past Psychiatric History: Over the last several years the patient has been treated since he has been incarcerated.  Prior to that his last psychiatric hospitalization within our system was at the behavioral health hospital in 2009.  There were several emergency room visits in 2013 and 14 for bipolar disorder as well as suicidal ideation.  He does appear he was referred out for those admissions.  He has been previously treated with Seroquel, Depakote, lithium, gabapentin, Zyprexa.  Past Medical History:  Past Medical History:  Diagnosis Date  . Bipolar 1 disorder (HCC)   . COPD (chronic obstructive pulmonary disease) (HCC)   . PTSD (post-traumatic stress disorder)   . Stroke [facility])    left-sided weakness, around 2008    Past Surgical History:  Procedure Laterality Date  . APPENDECTOMY    . BACK SURGERY    . CHOLECYSTECTOMY N/A 02/28/2017   Procedure: LAPAROSCOPIC CHOLECYSTECTOMY, possible open;  Surgeon: Lucretia Roers, MD;  Location: [facility];  Service: General;  Laterality: N/A;   Family History:  Family History  Problem Relation Age of  Onset  . Colon cancer Brother        diagnosed around age 67   . Pancreatic cancer Neg Hx   . Pancreatic disease Neg Hx    Family Psychiatric  History: Unknown Social History:  Social History   Substance and Sexual Activity  Alcohol Use No  . Alcohol/week: 1.0 standard drink  . Types: 1 Glasses of wine per week   Comment: occ     Social History   Substance and Sexual Activity  Drug Use No    Social History   Socioeconomic History  . Marital status: Widowed    Spouse name: Not on file  . Number of children: Not on file  . Years of education: Not on file  . Highest education level: Not on file  Occupational History  . Not on file  Tobacco Use  . Smoking status: Current Every Day Smoker    Packs/day: 0.50    Types: Cigarettes  . Smokeless tobacco: Never Used  . Tobacco comment: last smoke yesterday  Vaping Use  . Vaping Use: Former  Substance and Sexual Activity  . Alcohol use: No    Alcohol/week: 1.0 standard drink    Types: 1 Glasses of wine per week    Comment: occ  . Drug use: No  . Sexual activity: Yes  Other Topics Concern  . Not on file  Social History Narrative  . Not on file   Social Determinants of Health   Financial Resource Strain: Not on file  Food Insecurity: Not on file  Transportation Needs: Not on file  Physical Activity: Not on file  Stress: Not on file  Social Connections: Not on file   Additional Social History:                         Sleep: Poor  Appetite:  Fair  Current Medications: Current Facility-Administered Medications  Medication Dose Route Frequency Provider Last Rate Last Admin  . acetaminophen (TYLENOL) tablet 650 mg  650 mg Oral Q6H PRN Aldean Baker, NP   650 mg at 05/24/20 0028  . albuterol (VENTOLIN HFA) 108 (90 Base) MCG/ACT inhaler 1-2 puff  1-2 puff Inhalation Q6H PRN Aldean Baker, NP   2 puff at 05/23/20 [phone]  . alum & mag hydroxide-simeth (MAALOX/MYLANTA) 200-200-20 MG/5ML suspension 30 mL  30  mL Oral Q4H PRN Aldean Baker, NP      . cyclobenzaprine (FLEXERIL) tablet 5 mg  5 mg Oral TID PRN Eliseo Gum B, MD   5 mg at 05/23/20 1617  . gabapentin (NEURONTIN) capsule 300 mg  300 mg Oral BID Aldean Baker, NP   300 mg at 05/24/20 6073  . hydrOXYzine (ATARAX/VISTARIL) tablet 25 mg  25 mg Oral TID PRN Aldean Baker, NP   25 mg at 05/23/20 2058  . lithium carbonate (LITHOBID) CR tablet 300 mg  300 mg Oral BID Lauro Franklin, MD   300 mg at 05/24/20 1201  . magnesium hydroxide (MILK OF MAGNESIA) suspension 30 mL  30 mL Oral Daily PRN Aldean Baker, NP      . nicotine (NICODERM CQ - dosed in mg/24 hours) patch 14 mg  14 mg Transdermal Daily Nira Conn A, NP   14 mg at 05/24/20 0823  . OLANZapine (ZYPREXA) tablet 10 mg  10 mg Oral QHS Antonieta Pert, MD      . OLANZapine zydis [facility]) disintegrating tablet 5 mg  5 mg Oral Q8H PRN Aldean Baker, NP   5 mg at 05/24/20 0026  . traZODone (DESYREL) tablet 50 mg  50 mg Oral QHS PRN,MR X 1 Aldean Baker, NP   50 mg at 05/23/20 2058    Lab Results:  Results for orders placed or performed during the hospital encounter of 05/20/20 (from the past 48 hour(s))  Lithium level     Status: Abnormal   Collection Time: 05/24/20  6:40 AM  Result Value Ref Range   Lithium Lvl 0.31 (L) 0.60 - 1.20 mmol/L    Comment: Performed at [facility], [address] [place], [place] [address]  TSH     Status: None   Collection Time: 05/24/20  6:40 AM  Result Value Ref Range   TSH 2.436 0.350 - 4.500 uIU/mL    Comment: Performed by a 3rd Generation assay with a functional sensitivity of <=0.01 uIU/mL. Performed at [facility], [address], [place], [place] [address]   Basic metabolic panel     Status: Abnormal   Collection Time: 05/24/20  6:40 AM  Result Value Ref Range   Sodium 141 135 - 145 mmol/L   Potassium 4.1 3.5 - 5.1 mmol/L   Chloride 103 98 - 111 mmol/L   CO2 28 22 - 32 mmol/L   Glucose,  Bld 100 (H) 70 - 99 mg/dL    Comment: Glucose reference range applies only to samples taken after fasting for at least 8 hours.   BUN  16 6 - 20 mg/dL   Creatinine, Ser 9.52 0.61 - 1.24 mg/dL   Calcium 9.5 8.9 - 84.1 mg/dL   GFR, Estimated >32 >44 mL/min    Comment: (NOTE) Calculated using the CKD-EPI Creatinine Equation (2021)    Anion gap 10 5 - 15    Comment: Performed at [facility], [address] [place], [place] [address]    Blood Alcohol level:  Lab Results  Component Value Date   [facility] <10 05/19/2020   ETH <11 02/09/2013    Metabolic Disorder Labs: Lab Results  Component Value Date   HGBA1C 5.4 05/21/2020   MPG 108.28 05/21/2020   No results found for: PROLACTIN Lab Results  Component Value Date   CHOL 148 05/21/2020   TRIG 80 05/21/2020   HDL 49 05/21/2020   CHOLHDL 3.0 05/21/2020   VLDL 16 05/21/2020   LDLCALC 83 05/21/2020   LDLCALC 67 02/23/2017    Physical Findings: AIMS:  , ,  ,  ,    CIWA:    COWS:     Musculoskeletal: Strength & Muscle Tone: within normal limits Gait & Station: normal Patient leans: N/A  Psychiatric Specialty Exam: Physical Exam Vitals and nursing note reviewed.  Constitutional:      Appearance: He is normal weight.  HENT:     Head: Normocephalic and atraumatic.  Cardiovascular:     Rate and Rhythm: Normal rate.  Pulmonary:     Effort: Pulmonary effort is normal.  Musculoskeletal:        General: Deformity (Severe Kyphosis) present.  Neurological:     Mental Status: He is alert.     Review of Systems  Blood pressure 112/78, pulse 83, temperature 97.7 F (36.5 C), temperature source Oral, resp. rate 16, height 5\' 6"  (1.676 m), weight 82.1 kg, SpO2 98 %.Body mass index is 29.21 kg/m.  General Appearance: Disheveled  Eye Contact:  Fair  Speech:  Garbled and Pressured  Volume:  Normal  Mood:  Euphoric  Affect:  Congruent  Thought Process:  Disorganized  Orientation:  NA  Thought Content:   Delusions, Rumination and Tangential  Suicidal Thoughts:  No  Homicidal Thoughts:  No  Memory:  NA  Judgement:  Impaired  Insight:  Shallow  Psychomotor Activity:  Increased and Restlessness  Concentration:  Concentration: Poor and Attention Span: Poor  Recall:  NA  Fund of Knowledge:  NA  Language:  Fair  Akathisia:  Negative  Handed:  Right  AIMS (if indicated):     Assets:  Resilience  ADL's:  Intact  Cognition:  Impaired,  Mild  Sleep:  Number of Hours: 2.75     Treatment Plan Summary: Daily contact with patient to assess and evaluate symptoms and progress in treatment  He continues to be disorganized in his thinking and pressured in his speech. Will give a dose of Zyprexa this morning and increase QHS dosing due to low sleep. Lithium level found to be low at 0.3 will increase Lithium.  -Increase Lithium to 300 mg BID -One dose Zyprexa 5 mg AM -Increase Zyprexa to 10 mg QHS -Continue Gabapentin 300 mg BID -Continue Nicotine patch 14 mg daily -Continue PRN's: Tylenol, Albuterol, Maalox, Flexeril, Atarax, Milk of Magnesia, Trazodone -Continue Agitation Protocol Zyprexa/Ativan/Geodon   , MD 05/24/2020, 12:23 PM

## 2020-05-24 NOTE — [facility] Group Notes (Signed)
LCSW Group Therapy Note  05/24/2020   10:00-11:00am   Type of Therapy and Topic:  Group Therapy: Anger Cues and Responses  Participation Level:  Did Not Attend   Description of Group:   In this group, patients learned how to recognize the physical, cognitive, emotional, and behavioral responses they have to anger-provoking situations.  They identified a recent time they became angry and how they reacted.  They analyzed how their reaction was possibly beneficial and how it was possibly unhelpful.  The group discussed a variety of healthier coping skills that could help with such a situation in the future.  Focus was placed on how helpful it is to recognize the underlying emotions to our anger, because working on those can lead to a more permanent solution as well as our ability to focus on the important rather than the urgent.  Therapeutic Goals: 1. Patients will remember their last incident of anger and how they felt emotionally and physically, what their thoughts were at the time, and how they behaved. 2. Patients will identify how their behavior at that time worked for them, as well as how it worked against them. 3. Patients will explore possible new behaviors to use in future anger situations. 4. Patients will learn that anger itself is normal and cannot be eliminated, and that healthier reactions can assist with resolving conflict rather than worsening situations.  Summary of Patient Progress:  The patient declined to attend group, saying he was working on a project.  He did arrive to group with only about 5 minutes to go.  Therapeutic Modalities:   Cognitive Behavioral Therapy  Lynnell Chad

## 2020-05-24 NOTE — Progress Notes (Signed)
Adult Psychoeducational Group Note  Date:  05/24/2020 Time:  3:31 AM  Group Topic/Focus:  Wrap-Up Group:   The focus of this group is to help patients review their daily goal of treatment and discuss progress on daily workbooks.  Participation Level:  Active  Participation Quality:  Appropriate  Affect:  Appropriate  Cognitive:  Appropriate  Insight: Appropriate  Engagement in Group:  Engaged  Modes of Intervention:  Discussion  Additional Comments:  Pt attend wrap up group. His day was a 10 the one positive that happen he woke up today.  Charna Busman Long 05/24/2020, 3:31 AM

## 2020-05-25 MED ORDER — OLANZAPINE 7.5 MG PO TABS
15.0000 mg | ORAL_TABLET | Freq: Every day | ORAL | Status: DC
  Administered 2020-05-25: 21:00:00 15 mg via ORAL
  Filled 2020-05-25 (×2): qty 2

## 2020-05-25 MED ORDER — LITHIUM CARBONATE ER 450 MG PO TBCR
450.0000 mg | EXTENDED_RELEASE_TABLET | Freq: Two times a day (BID) | ORAL | Status: DC
  Administered 2020-05-25 – 2020-05-28 (×6): 450 mg via ORAL
  Filled 2020-05-25 (×9): qty 1

## 2020-05-25 NOTE — Progress Notes (Signed)
°   05/25/20 0021  Psych Admission Type (Psych Patients Only)  Admission Status Voluntary  Psychosocial Assessment  Patient Complaints Insomnia  Eye Contact Fair  Facial Expression Animated  Affect Anxious  Speech Loud;Tangential  Interaction Assertive;Needy;Intrusive;Sexually inappropriate  Motor Activity Shuffling  Behavior Characteristics Intrusive;Irritable;Impulsive  Mood Labile;Preoccupied  Thought Process  Content Paranoia;Preoccupation  Delusions None reported or observed  Perception WDL  Hallucination None reported or observed  Judgment Poor  Confusion Mild  Danger to Self  Current suicidal ideation? Denies  Danger to Others  Danger to Others None reported or observed

## 2020-05-25 NOTE — Progress Notes (Signed)
D:  Patient denied SI and HI, contracts for safety.  Denied A/V hallucinations.  Contracts for safety. A:  Medications administrated per MD orders.  Emotional support and encouragement given patient. R:  Safety maintained with 15 minute checks.

## 2020-05-25 NOTE — [facility] Group Notes (Addendum)
Adult Psychoeducational Group Not Date:  1219/2021 Time:  0814-4818 Group Topic/Focus: PROGRESSIVE RELAXATION. A group where deep breathing is taught and tensing and relaxation muscle groups is used. Imagery is used as well.  Pts are asked to imagine 3 pillars that hold them up when they are not able to hold themselves up.  Participation Level:  Active  Participation Quality:  inappropiate  Affect:  Appropriate  Cognitive:  Oriented  Insight: llimited  Engagement in Group:  Engaged  Modes of Intervention:  Activity, Discussion, Education, and Support  Additional Comments:  Pt rates his energy as a 10+.  The patient was taken to the 500 hall for group this morning due to his intrusiveness and limited insight, and his delusional thinking. When Pt first arrived in the group he made an announcement that he was he was [employer]. States what holds him up is Faith, love and happiness.  Craig [name] A 12/19//2021`

## 2020-05-25 NOTE — Progress Notes (Signed)
D:  Patient's self inventory sheet, patient is doing good, fair, poor.  Checked all blanks for appetite, energy , concentration.  Withdrawals checked all blanks.  Patient has been in his room, hallway and dayroom today, writing in journal, resting, in groups.\ A:  Medications administered per MD orders.  Emotional support and encouragement given patient. R:  Denied SI and HI, contracts for safety.  Denied A/V hallucinations.  Safety maintained with 15 minute checks.

## 2020-05-25 NOTE — [facility] Group Notes (Signed)
Psychoeducational Group Note  Date: 05-25-20 Time:  1300  Group Topic/Focus:  Making Healthy Choices:   The focus of this group is to help patients identify negative/unhealthy choices they were using prior to admission and identify positive/healthier coping strategies to replace them upon discharge.  Participation Level:  Did not attend   Maher Shon A   

## 2020-05-25 NOTE — Plan of Care (Signed)
Nurse discussed anxiety, depression, coping skills with patient. 

## 2020-05-25 NOTE — Progress Notes (Signed)
   05/25/20 0016  COVID-19 Daily Checkoff  Have you had a fever (temp > 37.80C/100F)  in the past 24 hours?  No  If you have had runny nose, nasal congestion, sneezing in the past 24 hours, has it worsened? No  COVID-19 EXPOSURE  Have you traveled outside the state in the past 14 days? No  Have you been in contact with someone with a confirmed diagnosis of COVID-19 or PUI in the past 14 days without wearing appropriate PPE? No  Have you been living in the same home as a person with confirmed diagnosis of COVID-19 or a PUI (household contact)? No  Have you been diagnosed with COVID-19? No

## 2020-05-25 NOTE — Progress Notes (Signed)
2300 Pt required shot of Geodon for agitation.  Pt remained in hall talking inappropriately and loud after dayroom had closed.  Pt then went to room, turned on all lights and continued to talk loudly both to himself and to staff at every 15 minute check.  PA notified and order received.  Pt stated he was glad to get shot of Geodon because then "maybe I can rest a little.  You know what is going on".  Pt did not elaborate as to what was going on.  Will continue to monitor.

## 2020-05-25 NOTE — Plan of Care (Signed)
Nurse discussed anxiety, depression and coping skills with patient.  

## 2020-05-25 NOTE — [facility] Group Notes (Signed)
[facility] Group Notes: (Clinical Social Work)   05/25/2020      Type of Therapy:  Group Therapy   Participation Level:  Did Not Attend - was invited both individually by MHT and by overhead announcement, chose not to attend.   Ambrose Mantle, LCSW 05/25/2020, 2:31 PM

## 2020-05-25 NOTE — Progress Notes (Addendum)
[facility] MD Progress Note  05/25/2020 9:43 AM Craig [name]  MRN:  [id number] Subjective:  This AM patient reports that he did not sleep much and he has been busy working on his Christmas decorations made out of toilet paper. Patient reports that he is eating well, but he may have had some indigestion but patient reports that it went away. Patient reports that is mood is "great!"Patient reports being aware of his recent increase in Lithium and he is okay with this as long as he can drink water. Patient contracts for safety.  Objective: Overnight patient required 40mg  IM Gedone, but patient was still very active this AM. Patient also required PRN Zyprexa and Ativan. Writer walked into patient room and there was toilet paper tied together almost the length of the room wrapped from the doorway to the far bed multiple times. Upon asking the patient said that he wanted to roll it up and take to place on the Christmas tree in the gym downstairs. Patient also had a mass of tissues that he created into a "boquet" of flowers for decorations as well. Patient was very excited about his work. Of note there had been issues with the patient giving gifts to another patient on 300 hall. It was made very clear to patient on exam that he cannot give other patient's gifts and he voiced understanding. While patient was extremely tangential he was redirectable and pleasant. Patient is not fully able to say why he is in the hospital but he knows he wanted to come because he felt he needed help with "something" he had been neglecting for years when he was in jail.  Principal Problem: Severe manic bipolar 1 disorder with psychotic behavior (HCC) Diagnosis: Principal Problem:   Severe manic bipolar 1 disorder with psychotic behavior (HCC)  Total Time spent with patient: 15 minutes  Past Psychiatric History: See H&P  Past Medical History:  Past Medical History:  Diagnosis Date  . Bipolar 1 disorder (HCC)   . COPD (chronic  obstructive pulmonary disease) (HCC)   . PTSD (post-traumatic stress disorder)   . Stroke [facility])    left-sided weakness, around 2008    Past Surgical History:  Procedure Laterality Date  . APPENDECTOMY    . BACK SURGERY    . CHOLECYSTECTOMY N/A 02/28/2017   Procedure: LAPAROSCOPIC CHOLECYSTECTOMY, possible open;  Surgeon: 03/02/2017, MD;  Location: [facility];  Service: General;  Laterality: N/A;   Family History:  Family History  Problem Relation Age of Onset  . Colon cancer Brother        diagnosed around age 39   . Pancreatic cancer Neg Hx   . Pancreatic disease Neg Hx    Family Psychiatric  History: See H&P Social History:  Social History   Substance and Sexual Activity  Alcohol Use No  . Alcohol/week: 1.0 standard drink  . Types: 1 Glasses of wine per week   Comment: occ     Social History   Substance and Sexual Activity  Drug Use No    Social History   Socioeconomic History  . Marital status: Widowed    Spouse name: Not on file  . Number of children: Not on file  . Years of education: Not on file  . Highest education level: Not on file  Occupational History  . Not on file  Tobacco Use  . Smoking status: Current Every Day Smoker    Packs/day: 0.50    Types: Cigarettes  . Smokeless tobacco: Never  Used  . Tobacco comment: last smoke yesterday  Vaping Use  . Vaping Use: Former  Substance and Sexual Activity  . Alcohol use: No    Alcohol/week: 1.0 standard drink    Types: 1 Glasses of wine per week    Comment: occ  . Drug use: No  . Sexual activity: Yes  Other Topics Concern  . Not on file  Social History Narrative  . Not on file   Social Determinants of Health   Financial Resource Strain: Not on file  Food Insecurity: Not on file  Transportation Needs: Not on file  Physical Activity: Not on file  Stress: Not on file  Social Connections: Not on file   Additional Social History:                         Sleep: Poor  Appetite:   Good  Current Medications: Current Facility-Administered Medications  Medication Dose Route Frequency Provider Last Rate Last Admin  . acetaminophen (TYLENOL) tablet 650 mg  650 mg Oral Q6H PRN Aldean Baker, NP   650 mg at 05/24/20 1758  . albuterol (VENTOLIN HFA) 108 (90 Base) MCG/ACT inhaler 1-2 puff  1-2 puff Inhalation Q6H PRN Aldean Baker, NP   2 puff at 05/25/20 0752  . alum & mag hydroxide-simeth (MAALOX/MYLANTA) 200-200-20 MG/5ML suspension 30 mL  30 mL Oral Q4H PRN Aldean Baker, NP      . cyclobenzaprine (FLEXERIL) tablet 5 mg  5 mg Oral TID PRN Bobbye Morton, MD   5 mg at 05/25/20 0757  . gabapentin (NEURONTIN) capsule 300 mg  300 mg Oral BID Aldean Baker, NP   300 mg at 05/25/20 0749  . hydrOXYzine (ATARAX/VISTARIL) tablet 25 mg  25 mg Oral TID PRN Aldean Baker, NP   25 mg at 05/25/20 0758  . lithium carbonate (LITHOBID) CR tablet 300 mg  300 mg Oral BID Lauro Franklin, MD   300 mg at 05/25/20 0749  . ziprasidone (GEODON) injection 20 mg  20 mg Intramuscular Q12H PRN Nwoko, Uchenna E, PA   20 mg at 05/24/20 2308   And  . LORazepam (ATIVAN) tablet 1 mg  1 mg Oral PRN Nwoko, Uchenna E, PA      . magnesium hydroxide (MILK OF MAGNESIA) suspension 30 mL  30 mL Oral Daily PRN Aldean Baker, NP      . nicotine (NICODERM CQ - dosed in mg/24 hours) patch 14 mg  14 mg Transdermal Daily Nira Conn A, NP   14 mg at 05/25/20 0750  . OLANZapine (ZYPREXA) tablet 10 mg  10 mg Oral QHS Antonieta Pert, MD   10 mg at 05/24/20 2112  . OLANZapine zydis (ZYPREXA) disintegrating tablet 5 mg  5 mg Oral Q8H PRN Aldean Baker, NP   5 mg at 05/24/20 2203  . traZODone (DESYREL) tablet 50 mg  50 mg Oral QHS PRN,MR X 1 Aldean Baker, NP   50 mg at 05/24/20 2203    Lab Results:  Results for orders placed or performed during the hospital encounter of 05/20/20 (from the past 48 hour(s))  Lithium level     Status: Abnormal   Collection Time: 05/24/20  6:40 AM  Result Value Ref Range    Lithium Lvl 0.31 (L) 0.60 - 1.20 mmol/L    Comment: Performed at [facility], [address], [place], [place] [address]  TSH  Status: None   Collection Time: 05/24/20  6:40 AM  Result Value Ref Range   TSH 2.436 0.350 - 4.500 uIU/mL    Comment: Performed by a 3rd Generation assay with a functional sensitivity of <=0.01 uIU/mL. Performed at [facility], [address] [place], [place] [address]   Basic metabolic panel     Status: Abnormal   Collection Time: 05/24/20  6:40 AM  Result Value Ref Range   Sodium 141 135 - 145 mmol/L   Potassium 4.1 3.5 - 5.1 mmol/L   Chloride 103 98 - 111 mmol/L   CO2 28 22 - 32 mmol/L   Glucose, Bld 100 (H) 70 - 99 mg/dL    Comment: Glucose reference range applies only to samples taken after fasting for at least 8 hours.   BUN 16 6 - 20 mg/dL   Creatinine, Ser 6.040.75 0.61 - 1.24 mg/dL   Calcium 9.5 8.9 - 54.010.3 mg/dL   GFR, Estimated >98>60 >11>60 mL/min    Comment: (NOTE) Calculated using the CKD-EPI Creatinine Equation (2021)    Anion gap 10 5 - 15    Comment: Performed at [facility], [address] [place], [place] [address]    Blood Alcohol level:  Lab Results  Component Value Date   [facility] <10 05/19/2020   ETH <11 02/09/2013    Metabolic Disorder Labs: Lab Results  Component Value Date   HGBA1C 5.4 05/21/2020   MPG 108.28 05/21/2020   No results found for: PROLACTIN Lab Results  Component Value Date   CHOL 148 05/21/2020   TRIG 80 05/21/2020   HDL 49 05/21/2020   CHOLHDL 3.0 05/21/2020   VLDL 16 05/21/2020   LDLCALC 83 05/21/2020   LDLCALC 67 02/23/2017    Physical Findings: AIMS: Facial and Oral Movements Muscles of Facial Expression: None, normal Lips and Perioral Area: None, normal Jaw: None, normal Tongue: None, normal,Extremity Movements Upper (arms, wrists, hands, fingers): None, normal Lower (legs, knees, ankles, toes): None, normal, Trunk Movements Neck,  shoulders, hips: None, normal, Overall Severity Severity of abnormal movements (highest score from questions above): None, normal Incapacitation due to abnormal movements: None, normal Patient's awareness of abnormal movements (rate only patient's report): No Awareness, Dental Status Current problems with teeth and/or dentures?: No Does patient usually wear dentures?: No  CIWA:    COWS:     Musculoskeletal: Strength & Muscle Tone: within normal limits Gait & Station: normal Patient leans: Front  Psychiatric Specialty Exam: Physical Exam HENT:     Head: Normocephalic.  Pulmonary:     Effort: Pulmonary effort is normal.  Neurological:     Mental Status: He is alert.     Review of Systems  Constitutional: Negative for fatigue.  Gastrointestinal: Negative for constipation.  Psychiatric/Behavioral: The patient is hyperactive.     Blood pressure 118/79, pulse 90, temperature (!) 97.5 F (36.4 C), temperature source Oral, resp. rate 16, height 5\' 6"  (1.676 m), weight 82.1 kg, SpO2 98 %.Body mass index is 29.21 kg/m.  General Appearance: Fairly Groomed but still a bit bizarre. He has his first mask in his belt loop and it is not apparent patient is even aware that he did this as it is in the back belt loops  Eye Contact:  Fair  Speech:  Pressured  Volume:  Increased  Mood:  Euthymic  Affect:  Congruent  Thought Process:  Disorganized  Orientation:  NA  Thought Content:  Tangential  Suicidal Thoughts:  No  Homicidal  Thoughts:  No  Memory:  Recent;   Fair  Judgement:  Impaired  Insight:  Shallow  Psychomotor Activity:  Increased  Concentration:  Concentration: Poor  Recall:  NA  Fund of Knowledge:  NA  Language:  Good  Akathisia:  No  Handed:  Right  AIMS (if indicated):     Assets:  Desire for Improvement Leisure Time  ADL's:  Intact  Cognition:  WNL  Sleep:  Number of Hours: 5.5     Treatment Plan Summary: Daily contact with patient to assess and evaluate  symptoms and progress in treatment  Mr. Sandersis a 59 year old male with a suspected past psychiatric history significant for bipolar disorder who was admitted secondary to exacerbation of his bipolar symptoms. Patient continues to be pleasantly manic. Patient continues to require high doses of antipsychotics. Another patient reported that Toua was giving her gifts and saying he was going to marry her, so patient is also endorsing some delusional thoughts throughout the day. Patient continues to sleep very little and is busy throughout the day and night with his own personal tasks. Will continue medication adjustments as patient continues to be manic. Patient EKG this AM had QTc 405.  Bipolar 1 disorder, manic episode, w/ pyschosis current - Gabapentin 300 TID - Lithium 450mg  BID - Zyprexa 15mg  QHS  Tobacco use disorder - Nicotine 14mg  patch  Severe kyphosis - Flexeril 5mg  TID PRN, for muscle spasms  PRN -Tylenol 650mg  q6h - Albuterol inhalers -Maalox 57ml q4h -Atarax 25mg  TID, anxiety  Agitation protocol - Zyprexa 5mg  - Ativan1mg   - Geodon 20mg  IM  -Milk of Mag 71mL -Trazodone 50mg x 1QHS PGY-1 , MD 05/25/2020, 9:43 AM

## 2020-05-26 MED ORDER — OLANZAPINE 10 MG PO TABS
20.0000 mg | ORAL_TABLET | Freq: Every day | ORAL | Status: DC
  Administered 2020-05-26 – 2020-05-29 (×4): 20 mg via ORAL
  Filled 2020-05-26 (×6): qty 2

## 2020-05-26 MED ORDER — OLANZAPINE 10 MG PO TABS
10.0000 mg | ORAL_TABLET | Freq: Every day | ORAL | Status: DC
  Administered 2020-05-26 – 2020-05-30 (×5): 10 mg via ORAL
  Filled 2020-05-26 (×8): qty 1

## 2020-05-26 MED ORDER — TRAMADOL HCL 50 MG PO TABS
50.0000 mg | ORAL_TABLET | Freq: Four times a day (QID) | ORAL | Status: DC | PRN
  Administered 2020-05-26 – 2020-05-30 (×9): 50 mg via ORAL
  Filled 2020-05-26 (×9): qty 1

## 2020-05-26 MED ORDER — METHOCARBAMOL 750 MG PO TABS
750.0000 mg | ORAL_TABLET | Freq: Four times a day (QID) | ORAL | Status: DC | PRN
  Administered 2020-05-26 – 2020-05-30 (×9): 750 mg via ORAL
  Filled 2020-05-26 (×9): qty 1

## 2020-05-26 NOTE — Progress Notes (Signed)
DAR NOTE: Patient presents with anxious affect and irritable mood. Complained of back  pain, denied auditory and visual hallucinations.    Maintained on routine safety checks.  Medications given as prescribed.  Support and encouragement offered as needed.  Attended group and participated. Will continue to monitor.

## 2020-05-26 NOTE — Progress Notes (Signed)
   05/26/20 2100  Psych Admission Type (Psych Patients Only)  Admission Status Voluntary  Psychosocial Assessment  Patient Complaints Anxiety  Eye Contact Fair  Facial Expression Animated  Affect Anxious  Speech Loud;Tangential  Interaction Assertive;Needy;Intrusive;Sexually inappropriate  Motor Activity Shuffling  Appearance/Hygiene Unremarkable  Behavior Characteristics Cooperative;Anxious  Mood Anxious;Pleasant  Thought Process  Coherency Tangential;Loose associations  Content Paranoia;Preoccupation  Delusions None reported or observed  Perception WDL  Hallucination None reported or observed  Judgment Poor  Confusion Mild  Danger to Self  Current suicidal ideation? Denies  Danger to Others  Danger to Others None reported or observed

## 2020-05-26 NOTE — Progress Notes (Signed)
D:  Patients self inventory sheet, patient denied SI and HI, contracts for safety.  Denied A/V hallucinations.   A:  Medications administered per MD orders.  Emotional support and encouragement given patient. R:  Safety maintained with 15 minute checks.  

## 2020-05-26 NOTE — Progress Notes (Signed)
Recreation Therapy Notes  Date:  12.20.21 Time: 0930 Location: 300 Hall Group Room  Group Topic: Stress Management  Goal Area(s) Addresses:  Patient will identify positive stress management techniques. Patient will identify benefits of using stress management post d/c.  Intervention: Stress Management  Activity: Meditation.  LRT played a meditation that focused on mindful listening.  The meditation focused on how to be present when others are speaking to you.  The meditation talked about having an open mind and not engaging to correct or confront the person but just hear them out.  Patients were to follow along as the meditation played to engage in the activity.      Education:  Stress Management, Discharge Planning.   Education Outcome: Acknowledges Education  Clinical Observations/Feedback:  Pt did not attend activity.      Caroll Rancher, LRT/CTRS         Caroll Rancher A 05/26/2020 11:04 AM

## 2020-05-26 NOTE — Progress Notes (Signed)
Adult Psychoeducational Group Note  Date:  05/26/2020 Time:  9:35 PM  Group Topic/Focus:  Wrap-Up Group:   The focus of this group is to help patients review their daily goal of treatment and discuss progress on daily workbooks.  Participation Level:  Did Not Attend  Participation Quality:  Did Not Attend  Affect:  Did Not Attend  Cognitive:  Did Not Attend  Insight: None  Engagement in Group:  Did Not Attend  Modes of Intervention:  Did Not Attend  Additional Comments:  Pt did not attend evening wrap up group tonight.  Felipa Furnace 05/26/2020, 9:35 PM

## 2020-05-26 NOTE — Progress Notes (Signed)
[facility] MD Progress Note  05/26/2020 2:50 PM Craig [name]  MRN:  [id number] Subjective:  This AM patient reports that he slept well and per EMR patient slept 5.5 hours which is more than prior nights. Patient reports that he is fasting today for "religious" reasons when asked about his appetite. Patient does not endorse SI, HI, nor AVH and does contract for safety. Patient report that his mood this AM is not as great as yesterday but he is feeling "absolutely beautiful" and patient has no concerns about his medications.   Of note patient appears to be working on smaller scale projects and continues to be very disorganized in his thought and remains convinced that he has some high level political power. Patient was also showing Clinical research associate that he has been saving his old Band-Aid in case he needs one. Writer let patient know he can ask for Band-Aid and he patient said ok. Principal Problem: Severe manic bipolar 1 disorder with psychotic behavior (HCC) Diagnosis: Principal Problem:   Severe manic bipolar 1 disorder with psychotic behavior (HCC)  Total Time spent with patient: 15 minutes  Past Psychiatric History: See H&P  Past Medical History:  Past Medical History:  Diagnosis Date  . Bipolar 1 disorder (HCC)   . COPD (chronic obstructive pulmonary disease) (HCC)   . PTSD (post-traumatic stress disorder)   . Stroke [facility])    left-sided weakness, around 2008    Past Surgical History:  Procedure Laterality Date  . APPENDECTOMY    . BACK SURGERY    . CHOLECYSTECTOMY N/A 02/28/2017   Procedure: LAPAROSCOPIC CHOLECYSTECTOMY, possible open;  Surgeon: Lucretia Roers, MD;  Location: [facility];  Service: General;  Laterality: N/A;   Family History:  Family History  Problem Relation Age of Onset  . Colon cancer Brother        diagnosed around age 39   . Pancreatic cancer Neg Hx   . Pancreatic disease Neg Hx    Family Psychiatric  History: See H&P Social History:  Social History   Substance  and Sexual Activity  Alcohol Use No  . Alcohol/week: 1.0 standard drink  . Types: 1 Glasses of wine per week   Comment: occ     Social History   Substance and Sexual Activity  Drug Use No    Social History   Socioeconomic History  . Marital status: Widowed    Spouse name: Not on file  . Number of children: Not on file  . Years of education: Not on file  . Highest education level: Not on file  Occupational History  . Not on file  Tobacco Use  . Smoking status: Current Every Day Smoker    Packs/day: 0.50    Types: Cigarettes  . Smokeless tobacco: Never Used  . Tobacco comment: last smoke yesterday  Vaping Use  . Vaping Use: Former  Substance and Sexual Activity  . Alcohol use: No    Alcohol/week: 1.0 standard drink    Types: 1 Glasses of wine per week    Comment: occ  . Drug use: No  . Sexual activity: Yes  Other Topics Concern  . Not on file  Social History Narrative  . Not on file   Social Determinants of Health   Financial Resource Strain: Not on file  Food Insecurity: Not on file  Transportation Needs: Not on file  Physical Activity: Not on file  Stress: Not on file  Social Connections: Not on file   Additional Social History:  Sleep: Fair  Appetite:  Poor  Current Medications: Current Facility-Administered Medications  Medication Dose Route Frequency Provider Last Rate Last Admin  . acetaminophen (TYLENOL) tablet 650 mg  650 mg Oral Q6H PRN Aldean Baker, NP   650 mg at 05/26/20 1346  . albuterol (VENTOLIN HFA) 108 (90 Base) MCG/ACT inhaler 1-2 puff  1-2 puff Inhalation Q6H PRN Aldean Baker, NP   2 puff at 05/26/20 0745  . alum & mag hydroxide-simeth (MAALOX/MYLANTA) 200-200-20 MG/5ML suspension 30 mL  30 mL Oral Q4H PRN Aldean Baker, NP      . cyclobenzaprine (FLEXERIL) tablet 5 mg  5 mg Oral TID PRN Bobbye Morton, MD   5 mg at 05/26/20 0747  . gabapentin (NEURONTIN) capsule 300 mg  300 mg Oral BID Aldean Baker, NP   300 mg at 05/26/20 7322  . hydrOXYzine (ATARAX/VISTARIL) tablet 25 mg  25 mg Oral TID PRN Aldean Baker, NP   25 mg at 05/26/20 0747  . lithium carbonate (ESKALITH) CR tablet 450 mg  450 mg Oral BID Eliseo Gum B, MD   450 mg at 05/26/20 0739  . ziprasidone (GEODON) injection 20 mg  20 mg Intramuscular Q12H PRN Nwoko, Uchenna E, PA   20 mg at 05/25/20 2250   And  . LORazepam (ATIVAN) tablet 1 mg  1 mg Oral PRN Nwoko, Uchenna E, PA      . magnesium hydroxide (MILK OF MAGNESIA) suspension 30 mL  30 mL Oral Daily PRN Aldean Baker, NP      . nicotine (NICODERM CQ - dosed in mg/24 hours) patch 14 mg  14 mg Transdermal Daily Nira Conn A, NP   14 mg at 05/26/20 0741  . OLANZapine (ZYPREXA) tablet 10 mg  10 mg Oral Daily Eliseo Gum B, MD   10 mg at 05/26/20 1151  . OLANZapine (ZYPREXA) tablet 20 mg  20 mg Oral QHS Ysabelle Goodroe B, MD      . OLANZapine zydis (ZYPREXA) disintegrating tablet 5 mg  5 mg Oral Q8H PRN Aldean Baker, NP   5 mg at 05/26/20 1244  . traZODone (DESYREL) tablet 50 mg  50 mg Oral QHS PRN,MR X 1 Aldean Baker, NP   50 mg at 05/25/20 2132    Lab Results: No results found for this or any previous visit (from the past 48 hour(s)).  Blood Alcohol level:  Lab Results  Component Value Date   [facility] <10 05/19/2020   ETH <11 02/09/2013    Metabolic Disorder Labs: Lab Results  Component Value Date   HGBA1C 5.4 05/21/2020   MPG 108.28 05/21/2020   No results found for: PROLACTIN Lab Results  Component Value Date   CHOL 148 05/21/2020   TRIG 80 05/21/2020   HDL 49 05/21/2020   CHOLHDL 3.0 05/21/2020   VLDL 16 05/21/2020   LDLCALC 83 05/21/2020   LDLCALC 67 02/23/2017    Physical Findings: AIMS: Facial and Oral Movements Muscles of Facial Expression: None, normal Lips and Perioral Area: None, normal Jaw: None, normal Tongue: None, normal,Extremity Movements Upper (arms, wrists, hands, fingers): None, normal Lower (legs, knees, ankles, toes):  None, normal, Trunk Movements Neck, shoulders, hips: None, normal, Overall Severity Severity of abnormal movements (highest score from questions above): None, normal Incapacitation due to abnormal movements: None, normal Patient's awareness of abnormal movements (rate only patient's report): No Awareness, Dental Status Current problems with teeth and/or dentures?: No Does patient usually wear dentures?: No  CIWA:    COWS:     Musculoskeletal: Strength & Muscle Tone: within normal limits Gait & Station: normal Patient leans: Front  Psychiatric Specialty Exam: Physical Exam  Review of Systems  Blood pressure 121/73, pulse 69, temperature (!) 97.4 F (36.3 C), temperature source Oral, resp. rate 16, height 5\' 6"  (1.676 m), weight 82.1 kg, SpO2 98 %.Body mass index is 29.21 kg/m.  General Appearance: Bizarre has multiple masked tied around in his belt loops  Eye Contact:  Good  Speech:  Pressured  Volume:  Increased  Mood:  Euphoric  Affect:  Congruent  Thought Process:  Disorganized  Orientation:  NA  Thought Content:  Illogical, Delusions and Obsessions obsessed with making decorations for Christmas particularly  Suicidal Thoughts:  No  Homicidal Thoughts:  No  Memory:  Recent;   Fair  Judgement:  Impaired  Insight:  Shallow  Psychomotor Activity:  Increased  Concentration:  Concentration: Poor  Recall:  NA  Fund of Knowledge:  Good  Language:  Good  Akathisia:  No  Handed:  Right  AIMS (if indicated):     Assets:  Desire for Improvement Leisure Time Resilience  ADL's:  Intact  Cognition:  WNL  Sleep:  Number of Hours: 5.5     Treatment Plan Summary: Daily contact with patient to assess and evaluate symptoms and progress in treatment  Mr. Sandersis a 59 year old male with a suspected past psychiatric history significant for bipolar disorder who was admitted secondary to exacerbation of his bipolar symptoms.Patient continues to be pleasantly manic. Patient does  appear less manic than yesterday as his projects are less grand in size and patient has been able to abide by the rules set yesterday to not give gifts to other patients. Patient also remains compliant with his medications and appears to slowly gaining more sleep; however patient does continue to appear to have excessive energy and disorganized thought. Patient will need his Lithium levels drawn on Wednesday. Will also readjust his Zyprexa as patient appears to be doing well with the medication addition but is still requiring PRN during the day. Patient will be transferred to [address] as patient may do better on a closed unit.  Bipolar 1 disorder, manic episode,w/ pyschosiscurrent - Gabapentin 300 TID - Lithium 450mg  BID - Zyprexa 10mg  daily and 20mg  QHS - Wed TSH, Li, BMP  Tobacco use disorder - Nicotine 14mg  patch  Severe kyphosis - Flexeril 5mg  TID PRN, for muscle spasms  PRN -Tylenol 650mg  q6h - Albuterol inhalers -Maalox 78ml q4h -Atarax 25mg  TID, anxiety  Agitation protocol - Zyprexa 5mg  - Ativan1mg   - Geodon 20mg  IM  -Milk of Mag 69mL -Trazodone 50mg x 1QHS PGY-1 , MD 05/26/2020, 2:50 PM

## 2020-05-26 NOTE — [facility] Group Notes (Signed)
Adult Psychoeducational Group Note  Date:  05/26/2020 Time:  10:03 AM  Group Topic/Focus:  Orientation:   The focus of this group is to educate the patient on the purpose and policies of crisis stabilization and provide a format to answer questions about their admission.  The group details unit policies and expectations of patients while admitted.  Participation Level:  Active  Participation Quality:  Appropriate  Affect:  Appropriate  Cognitive:  Appropriate  Insight: Appropriate  Engagement in Group:  Engaged  Modes of Intervention:  Orientation  Additional Comments:  Patient attended morning Orientation and goal setting group. Patient said that his goal for today is to stay focus.   Latesha Chesney W Harvey Lingo 05/26/2020, 10:03 AM

## 2020-05-26 NOTE — Plan of Care (Signed)
Nurse discussed anxiety, depression, coping skills with patient. 

## 2020-05-26 NOTE — Progress Notes (Signed)
Adult Psychoeducational Group Note  Date:  05/26/2020 Time:  5:37 AM  Group Topic/Focus:  Wrap-Up Group:   The focus of this group is to help patients review their daily goal of treatment and discuss progress on daily workbooks.  Participation Level:  Active  Participation Quality:  Appropriate  Affect:  Appropriate  Cognitive:  Appropriate  Insight: Appropriate  Engagement in Group:  Engaged  Modes of Intervention:  Discussion  Additional Comments:  Pt attend wrap up group his day 10. Pt said the one thing that happen to him today he is in the room ready to go home.  Charna Busman Long 05/26/2020, 5:37 AM

## 2020-05-26 NOTE — [facility] Group Notes (Signed)
[facility] LCSW Group Therapy  05/26/2020 2:31 PM  Type of Therapy:  Coping Skills  Participation Level:  Did Not Attend  Participation Quality:  Did not attend  Affect:  Did not attend  Cognitive:  Did not attend  Insight:  Did not attend  Engagement in Therapy:  Did not attend  Modes of Intervention:  Activity and Discussion  Summary of Progress/Problems: CSW's took patients to the gym for group.  Craig [name] did not attend the group.   [name] 05/26/2020, 2:31 PM

## 2020-05-26 NOTE — Progress Notes (Signed)
Pt increased became agitated and irritable, being loud and using curse words. Pt will not follow directions. Pt medicated with Geodon 20 mg IM, pt later went to bed, will continue to monitor.

## 2020-05-26 NOTE — Tx Team (Signed)
Interdisciplinary Treatment and Diagnostic Plan Update  05/26/2020 Time of Session: 9:15am LYNDA CAPISTRAN MRN: [id number]  Principal Diagnosis: Severe manic bipolar 1 disorder with psychotic behavior (HCC)  Secondary Diagnoses: Principal Problem:   Severe manic bipolar 1 disorder with psychotic behavior (HCC)   Current Medications:  Current Facility-Administered Medications  Medication Dose Route Frequency Provider Last Rate Last Admin  . acetaminophen (TYLENOL) tablet 650 mg  650 mg Oral Q6H PRN Aldean Baker, NP   650 mg at 05/26/20 0401  . albuterol (VENTOLIN HFA) 108 (90 Base) MCG/ACT inhaler 1-2 puff  1-2 puff Inhalation Q6H PRN Aldean Baker, NP   2 puff at 05/26/20 0745  . alum & mag hydroxide-simeth (MAALOX/MYLANTA) 200-200-20 MG/5ML suspension 30 mL  30 mL Oral Q4H PRN Aldean Baker, NP      . cyclobenzaprine (FLEXERIL) tablet 5 mg  5 mg Oral TID PRN Bobbye Morton, MD   5 mg at 05/26/20 0747  . gabapentin (NEURONTIN) capsule 300 mg  300 mg Oral BID Aldean Baker, NP   300 mg at 05/26/20 2595  . hydrOXYzine (ATARAX/VISTARIL) tablet 25 mg  25 mg Oral TID PRN Aldean Baker, NP   25 mg at 05/26/20 0747  . lithium carbonate (ESKALITH) CR tablet 450 mg  450 mg Oral BID Eliseo Gum B, MD   450 mg at 05/26/20 0739  . ziprasidone (GEODON) injection 20 mg  20 mg Intramuscular Q12H PRN Nwoko, Uchenna E, PA   20 mg at 05/25/20 2250   And  . LORazepam (ATIVAN) tablet 1 mg  1 mg Oral PRN Nwoko, Uchenna E, PA      . magnesium hydroxide (MILK OF MAGNESIA) suspension 30 mL  30 mL Oral Daily PRN Aldean Baker, NP      . nicotine (NICODERM CQ - dosed in mg/24 hours) patch 14 mg  14 mg Transdermal Daily Nira Conn A, NP   14 mg at 05/26/20 0741  . OLANZapine (ZYPREXA) tablet 10 mg  10 mg Oral Daily Eliseo Gum B, MD   10 mg at 05/26/20 1151  . OLANZapine (ZYPREXA) tablet 20 mg  20 mg Oral QHS McQuilla, Jai B, MD      . OLANZapine zydis (ZYPREXA) disintegrating tablet 5 mg  5 mg Oral  Q8H PRN Aldean Baker, NP   5 mg at 05/25/20 1341  . traZODone (DESYREL) tablet 50 mg  50 mg Oral QHS PRN,MR X 1 Aldean Baker, NP   50 mg at 05/25/20 2132   PTA Medications: Medications Prior to Admission  Medication Sig Dispense Refill Last Dose  . busPIRone (BUSPAR) 15 MG tablet Take 15 mg by mouth 2 (two) times daily.     Marland Kitchen gabapentin (NEURONTIN) 300 MG capsule One tablet bid (Patient taking differently: Take 300 mg by mouth 2 (two) times daily. One tablet bid) 60 capsule 0   . lithium 300 MG tablet Take 1 tablet by mouth at bedtime.     Marland Kitchen tiZANidine (ZANAFLEX) 4 MG tablet Take 4 mg by mouth at bedtime.     Marland Kitchen albuterol (PROVENTIL HFA;VENTOLIN HFA) 108 (90 Base) MCG/ACT inhaler Inhale 2 puffs into the lungs every 6 (six) hours as needed for wheezing or shortness of breath. 1 Inhaler 2   . ipratropium-albuterol (DUONEB) 0.5-2.5 (3) MG/3ML SOLN Take 3 mLs by nebulization every 4 (four) hours as needed. 360 mL 1   . levothyroxine (SYNTHROID) 100 MCG tablet Take 100 mcg by mouth daily.     Marland Kitchen  pantoprazole (PROTONIX) 40 MG tablet Take 1 tablet (40 mg total) by mouth daily before breakfast. (Patient not taking: Reported on 05/20/2020) 30 tablet 1   . potassium chloride SA (K-DUR,KLOR-CON) 20 MEQ tablet Take 1 tablet (20 mEq total) by mouth 2 (two) times daily. (Patient not taking: Reported on 05/20/2020) 60 tablet 1     Patient Stressors: Other: pt endorses no stressors  Patient Strengths: Average or above average intelligence Supportive family/friends  Treatment Modalities: Medication Management, Group therapy, Case management,  1 to 1 session with clinician, Psychoeducation, Recreational therapy.   Physician Treatment Plan for Primary Diagnosis: Severe manic bipolar 1 disorder with psychotic behavior (HCC) Long Term Goal(s): Improvement in symptoms so as ready for discharge Improvement in symptoms so as ready for discharge   Short Term Goals: Ability to identify changes in lifestyle to  reduce recurrence of condition will improve Ability to verbalize feelings will improve Ability to demonstrate self-control will improve Ability to identify and develop effective coping behaviors will improve Ability to maintain clinical measurements within normal limits will improve Compliance with prescribed medications will improve Ability to identify triggers associated with substance abuse/mental health issues will improve Ability to identify changes in lifestyle to reduce recurrence of condition will improve Ability to verbalize feelings will improve Ability to demonstrate self-control will improve Ability to identify and develop effective coping behaviors will improve Ability to maintain clinical measurements within normal limits will improve Compliance with prescribed medications will improve Ability to identify triggers associated with substance abuse/mental health issues will improve  Medication Management: Evaluate patient's response, side effects, and tolerance of medication regimen.  Therapeutic Interventions: 1 to 1 sessions, Unit Group sessions and Medication administration.  Evaluation of Outcomes: Progressing  Physician Treatment Plan for Secondary Diagnosis: Principal Problem:   Severe manic bipolar 1 disorder with psychotic behavior (HCC)  Long Term Goal(s): Improvement in symptoms so as ready for discharge Improvement in symptoms so as ready for discharge   Short Term Goals: Ability to identify changes in lifestyle to reduce recurrence of condition will improve Ability to verbalize feelings will improve Ability to demonstrate self-control will improve Ability to identify and develop effective coping behaviors will improve Ability to maintain clinical measurements within normal limits will improve Compliance with prescribed medications will improve Ability to identify triggers associated with substance abuse/mental health issues will improve Ability to identify  changes in lifestyle to reduce recurrence of condition will improve Ability to verbalize feelings will improve Ability to demonstrate self-control will improve Ability to identify and develop effective coping behaviors will improve Ability to maintain clinical measurements within normal limits will improve Compliance with prescribed medications will improve Ability to identify triggers associated with substance abuse/mental health issues will improve     Medication Management: Evaluate patient's response, side effects, and tolerance of medication regimen.  Therapeutic Interventions: 1 to 1 sessions, Unit Group sessions and Medication administration.  Evaluation of Outcomes: Progressing   RN Treatment Plan for Primary Diagnosis: Severe manic bipolar 1 disorder with psychotic behavior (HCC) Long Term Goal(s): Knowledge of disease and therapeutic regimen to maintain health will improve  Short Term Goals: Ability to remain free from injury will improve, Ability to verbalize frustration and anger appropriately will improve, Ability to demonstrate self-control, Ability to participate in decision making will improve, Ability to identify and develop effective coping behaviors will improve and Compliance with prescribed medications will improve  Medication Management: RN will administer medications as ordered by provider, will assess and evaluate patient's response and  provide education to patient for prescribed medication. RN will report any adverse and/or side effects to prescribing provider.  Therapeutic Interventions: 1 on 1 counseling sessions, Psychoeducation, Medication administration, Evaluate responses to treatment, Monitor vital signs and CBGs as ordered, Perform/monitor CIWA, COWS, AIMS and Fall Risk screenings as ordered, Perform wound care treatments as ordered.  Evaluation of Outcomes: Progressing   LCSW Treatment Plan for Primary Diagnosis: Severe manic bipolar 1 disorder with  psychotic behavior (HCC) Long Term Goal(s): Safe transition to appropriate next level of care at discharge, Engage patient in therapeutic group addressing interpersonal concerns.  Short Term Goals: Engage patient in aftercare planning with referrals and resources, Increase social support, Increase ability to appropriately verbalize feelings, Increase emotional regulation, Identify triggers associated with mental health/substance abuse issues and Increase skills for wellness and recovery  Therapeutic Interventions: Assess for all discharge needs, 1 to 1 time with Social worker, Explore available resources and support systems, Assess for adequacy in community support network, Educate family and significant other(s) on suicide prevention, Complete Psychosocial Assessment, Interpersonal group therapy.  Evaluation of Outcomes: Progressing   Progress in Treatment: Attending groups: Yes. Participating in groups: Yes. Taking medication as prescribed: Yes. Toleration medication: Yes. Family/Significant other contact made: No, will contact:  unable to contact consent due to having no contact information Patient understands diagnosis: No. Discussing patient identified problems/goals with staff: Yes. Medical problems stabilized or resolved: Yes. Denies suicidal/homicidal ideation: Yes. Issues/concerns per patient self-inventory: No.   New problem(s) identified: No, Describe:  none  New Short Term/Long Term Goal(s): medication stabilization, elimination of SI thoughts, development of comprehensive mental wellness plan.   Patient Goals:  "To be in the middle of the [employer]"  Discharge Plan or Barriers: Patient is to return to group home to live and is to follow up with [facility] for continued medication management and therapy.  Reason for Continuation of Hospitalization: Delusions  Hallucinations Mania Medication stabilization  Estimated Length of Stay: 3-5 days  Attendees: Patient:   05/21/2020   Physician: 05/21/2020   Nursing:  05/21/2020   RN Care Manager: 05/21/2020  Social Worker: Ruthann Cancer, LCSW 05/21/2020   Recreational Therapist:  05/21/2020   Other:  05/21/2020  Other:  05/21/2020   Other: 05/21/2020       Scribe for Treatment Team: Otelia Santee, LCSW 05/26/2020 11:55 AM

## 2020-05-27 DIAGNOSIS — Z720 Tobacco use: Secondary | ICD-10-CM

## 2020-05-27 DIAGNOSIS — Z79899 Other long term (current) drug therapy: Secondary | ICD-10-CM

## 2020-05-27 NOTE — Progress Notes (Signed)
Pt continues to be disorganized and manic at times. Pt still has bizarre statements and actions and continues to make things / projects on the unit.

## 2020-05-27 NOTE — Progress Notes (Addendum)
[facility] MD Progress Note  05/27/2020 3:55 PM Craig [name]  MRN:  [id number] Subjective:  Patient reports that he slept well last night and reports eating well again. This AM patient reports that he doing okay. 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 Patient contracts for safety this AM and does not endorse [facility] nor [facility].  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 Patient was also interacting with others more appropriately in the dayroom. Patient seemed to be less grand in his descriptions than usual.  Principal Problem: Severe manic bipolar 1 disorder with psychotic behavior (HCC) Diagnosis: Principal Problem:   Severe manic bipolar 1 disorder with psychotic behavior (HCC)  Total Time spent with patient: 20 minutes  Past Psychiatric History: See H&P  Past Medical History:  Past Medical History:  Diagnosis Date  . Bipolar 1 disorder (HCC)   . COPD (chronic obstructive pulmonary disease) (HCC)   . PTSD (post-traumatic stress disorder)   . Stroke [facility])    left-sided weakness, around 2008    Past Surgical History:  Procedure Laterality Date  . APPENDECTOMY    . BACK SURGERY    . CHOLECYSTECTOMY N/A 02/28/2017   Procedure: LAPAROSCOPIC CHOLECYSTECTOMY, possible open;  Surgeon: Lucretia Roers, MD;  Location: [facility];  Service: General;  Laterality: N/A;   Family History:  Family History  Problem Relation Age of Onset  . Colon cancer Brother        diagnosed around age 51   . Pancreatic cancer Neg Hx   . Pancreatic disease Neg Hx    Family Psychiatric  History: See H&P Social History:  Social History   Substance and Sexual Activity  Alcohol Use No  . Alcohol/week: 1.0 standard drink  . Types: 1 Glasses of  wine per week   Comment: occ     Social History   Substance and Sexual Activity  Drug Use No    Social History   Socioeconomic History  . Marital status: Widowed    Spouse name: Not on file  . Number of children: Not on file  . Years of education: Not on file  . Highest education level: Not on file  Occupational History  . Not on file  Tobacco Use  . Smoking status: Current Every Day Smoker    Packs/day: 0.50    Types: Cigarettes  . Smokeless tobacco: Never Used  . Tobacco comment: last smoke yesterday  Vaping Use  . Vaping Use: Former  Substance and Sexual Activity  . Alcohol use: No    Alcohol/week: 1.0 standard drink    Types: 1 Glasses of wine per week    Comment: occ  . Drug use: No  . Sexual activity: Yes  Other Topics Concern  . Not on file  Social History Narrative  . Not on file   Social Determinants of Health   Financial Resource Strain: Not on file  Food Insecurity: Not on file  Transportation Needs: Not on file  Physical Activity: Not on file  Stress: Not on file  Social Connections: Not on file   Additional Social History:  Sleep: Fair  Appetite:  Good  Current Medications: Current Facility-Administered Medications  Medication Dose Route Frequency Provider Last Rate Last Admin  . acetaminophen (TYLENOL) tablet 650 mg  650 mg Oral Q6H PRN Aldean Baker, NP   650 mg at 05/26/20 1346  . albuterol (VENTOLIN HFA) 108 (90 Base) MCG/ACT inhaler 1-2 puff  1-2 puff Inhalation Q6H PRN Aldean Baker, NP   2 puff at 05/27/20 1300  . alum & mag hydroxide-simeth (MAALOX/MYLANTA) 200-200-20 MG/5ML suspension 30 mL  30 mL Oral Q4H PRN Aldean Baker, NP      . gabapentin (NEURONTIN) capsule 300 mg  300 mg Oral BID Aldean Baker, NP   300 mg at 05/27/20 2878  . hydrOXYzine (ATARAX/VISTARIL) tablet 25 mg  25 mg Oral TID PRN Aldean Baker, NP   25 mg at 05/27/20 0727  . lithium carbonate (ESKALITH) CR tablet 450 mg  450 mg  Oral BID Eliseo Gum B, MD   450 mg at 05/27/20 0728  . ziprasidone (GEODON) injection 20 mg  20 mg Intramuscular Q12H PRN Nwoko, Uchenna E, PA   20 mg at 05/25/20 2250   And  . LORazepam (ATIVAN) tablet 1 mg  1 mg Oral PRN Nwoko, Uchenna E, PA      . magnesium hydroxide (MILK OF MAGNESIA) suspension 30 mL  30 mL Oral Daily PRN Aldean Baker, NP      . methocarbamol (ROBAXIN) tablet 750 mg  750 mg Oral Q6H PRN Antonieta Pert, MD   750 mg at 05/27/20 0727  . nicotine (NICODERM CQ - dosed in mg/24 hours) patch 14 mg  14 mg Transdermal Daily Nira Conn A, NP   14 mg at 05/27/20 0728  . OLANZapine (ZYPREXA) tablet 10 mg  10 mg Oral Daily Eliseo Gum B, MD   10 mg at 05/27/20 0728  . OLANZapine (ZYPREXA) tablet 20 mg  20 mg Oral QHS Eliseo Gum B, MD   20 mg at 05/26/20 2051  . OLANZapine zydis (ZYPREXA) disintegrating tablet 5 mg  5 mg Oral Q8H PRN Aldean Baker, NP   5 mg at 05/26/20 1244  . traMADol (ULTRAM) tablet 50 mg  50 mg Oral Q6H PRN Antonieta Pert, MD   50 mg at 05/27/20 0727  . traZODone (DESYREL) tablet 50 mg  50 mg Oral QHS PRN,MR X 1 Aldean Baker, NP   50 mg at 05/26/20 2120    Lab Results: No results found for this or any previous visit (from the past 48 hour(s)).  Blood Alcohol level:  Lab Results  Component Value Date   ETH <10 05/19/2020   ETH <11 02/09/2013    Metabolic Disorder Labs: Lab Results  Component Value Date   HGBA1C 5.4 05/21/2020   MPG 108.28 05/21/2020   No results found for: PROLACTIN Lab Results  Component Value Date   CHOL 148 05/21/2020   TRIG 80 05/21/2020   HDL 49 05/21/2020   CHOLHDL 3.0 05/21/2020   VLDL 16 05/21/2020   LDLCALC 83 05/21/2020   LDLCALC 67 02/23/2017    Physical Findings: AIMS: Facial and Oral Movements Muscles of Facial Expression: None, normal Lips and Perioral Area: None, normal Jaw: None, normal Tongue: None, normal,Extremity Movements Upper (arms, wrists, hands, fingers): None, normal Lower  (legs, knees, ankles, toes): None, normal, Trunk Movements Neck, shoulders, hips: None, normal, Overall Severity Severity of abnormal movements (highest score from questions above): None, normal Incapacitation due to abnormal movements: None,  normal Patient's awareness of abnormal movements (rate only patient's report): No Awareness, Dental Status Current problems with teeth and/or dentures?: No Does patient usually wear dentures?: No  CIWA:    COWS:     Musculoskeletal: Strength & Muscle Tone: within normal limits Gait & Station: normal Patient leans: Front  Psychiatric Specialty Exam: Physical Exam HENT:     Head: Normocephalic and atraumatic.  Pulmonary:     Effort: Pulmonary effort is normal.  Neurological:     Mental Status: He is alert.     Review of Systems  Cardiovascular: Negative for chest pain.  Gastrointestinal: Negative for abdominal pain.  Neurological: Negative for headaches.    Blood pressure 112/69, pulse 74, temperature 97.7 F (36.5 C), temperature source Oral, resp. rate 18, height 5\' 6"  (1.676 m), weight 82.1 kg, SpO2 98 %.Body mass index is 29.21 kg/m.  General Appearance: Bizarre still has mask tied around his pants, but did ask for new clothes today!  Eye Contact:  Good  Speech:  Pressured  Volume:  Increased  Mood:  Euphoric  Affect:  Congruent  Thought Process:  Disorganized  Orientation:  Full (Time, Place, and Person)  Thought Content:  Delusions  Suicidal Thoughts:  No  Homicidal Thoughts:  No  Memory:  Recent;   Fair  Judgement:  Impaired  Insight:  Shallow  Psychomotor Activity:  Increased  Concentration:  Concentration: Poor  Recall:  NA  Fund of Knowledge:  Fair  Language:  Fair  Akathisia:  No  Handed:  Right  AIMS (if indicated):     Assets:  Housing Leisure Time Resilience  ADL's:  Impaired  Cognition:  Impaired,  Mild  Sleep:  Number of Hours: 3.5     Treatment Plan Summary: Daily contact with patient to assess and  evaluate symptoms and progress in treatment  Mr. Sandersis a 59 year old male with a suspected past psychiatric history significant for bipolar disorder who was admitted secondary to exacerbation of his bipolar symptoms.Patient continues to be pleasantly manic. Patient did require 5mg  PRN Zyprexa again yesterday and continues to have few hours of sleep. Patient does appear less manic than yesterday as he is no longer working on projects in his room and his room is very clean. Patient also appeared to be interacting more in the dayroom rather than just talking at people he appeared to be be having conversations. Patient appears to be slowing down but will require his blood levels of Li to reevaluated in the AM. Patient appears to be doing well on the 500 unit.  Severe manic bipolar 1 disorder w/ psychotic behavior -Gabapentin 300 TID - Lithium 450mg  BID - Zyprexa 10mg  daily and 20mg  QHS - Wed TSH, Li, BMP   High Risk Medication Use: The complexity of this patient's case involves drug therapy with Lithium which requires intensive monitoring for toxicity. This is in part due to a narrow therapeutic window as well as potential for toxicity with this medication.   Tobacco use disorder - Nicotine 14mg  patch  Severe kyphosis - Robaxin 750mg  q6h PRN, for muscle spasms  PRN -Tylenol 650mg  q6h - Albuterol inhalers -Maalox 17ml q4h -Atarax 25mg  TID, anxiety  Agitation protocol - Zyprexa 5mg  - Ativan1mg   - Geodon 20mg  IM  -Milk of Mag 79mL -Trazodone 50mg x 1QHS  PGY-1 , MD 05/27/2020, 3:55 PM

## 2020-05-27 NOTE — Plan of Care (Signed)
  Problem: Education: Goal: Knowledge of [facility] Education information/materials will improve Outcome: Progressing Goal: Emotional status will improve Outcome: Progressing Goal: Mental status will improve Outcome: Progressing Goal: Verbalization of understanding the information provided will improve Outcome: Progressing   

## 2020-05-27 NOTE — Progress Notes (Signed)
Progress note    05/27/20 0727  Psych Admission Type (Psych Patients Only)  Admission Status Voluntary  Psychosocial Assessment  Patient Complaints Anxiety;Hyperactivity  Eye Contact Fair  Facial Expression Animated;Anxious  Affect Anxious;Preoccupied  Speech Pressured;Loud  Interaction Assertive;Attention-seeking;Intrusive;Needy  Motor Activity Shuffling;Unsteady  Appearance/Hygiene Unremarkable  Behavior Characteristics Cooperative;Appropriate to situation;Anxious;Hyperactive;Intrusive;Pacing  Mood Anxious;Pleasant  Thought Chartered certified accountant of ideas;Loose associations  Content Confabulation;Obsessions  Delusions Grandeur  Perception Derealization  Hallucination None reported or observed  Judgment Poor  Confusion Mild  Danger to Self  Current suicidal ideation? Denies  Danger to Others  Danger to Others None reported or observed

## 2020-05-27 NOTE — Progress Notes (Signed)
Adult Psychoeducational Group Note  Date:  05/27/2020 Time:  11:02 PM  Group Topic/Focus:  Wrap-Up Group:   The focus of this group is to help patients review their daily goal of treatment and discuss progress on daily workbooks.  Participation Level:  Minimal  Participation Quality:  Appropriate  Affect:  Appropriate  Cognitive:  Appropriate  Insight: Appropriate  Engagement in Group:  Limited  Modes of Intervention:  Discussion  Additional Comments: Pt stated his goal for today was to focus on his treatment plan. Pt stated he accomplished his goal today. Pt stated he was able to talk with his doctor and social worker about his care today. Pt rated his overall day a 10. Pt stated his relationship with his family and support system needs to be improved. Pt stated he was able to attend all meals. Pt stated he took all medications provided today. Pt stated he felt better about himself today. Pt stated his appetite had pretty good today. Pt rated sleep last night was pretty good. Pt stated the goal for tonight is to get some rest. Pt stated he was some physical pain today. Pt stated his lower back, left and right hips where all in pain. Pt rated the pain level a 8. Pt nurse was updated on situation. Pt deny auditory or visual hallucinations. Pt denies thoughts of harming himself or others. Pt stated he would alert staff if anything changes.   Felipa Furnace 05/27/2020, 11:02 PM

## 2020-05-27 NOTE — Progress Notes (Signed)
The focus of this group is to help patients establish daily goals to achieve during treatment and discuss how the patient can incorporate goal setting into their daily lives to aide in recovery.Patient attended group. 

## 2020-05-28 LAB — BASIC METABOLIC PANEL
Anion gap: 7 (ref 5–15)
BUN: 15 mg/dL (ref 6–20)
CO2: 26 mmol/L (ref 22–32)
Calcium: 9.3 mg/dL (ref 8.9–10.3)
Chloride: 105 mmol/L (ref 98–111)
Creatinine, Ser: 0.83 mg/dL (ref 0.61–1.24)
GFR, Estimated: >60 mL/min (ref >60)
Glucose, Bld: 107 mg/dL — ABNORMAL HIGH (ref 70–99)
Potassium: 4.2 mmol/L (ref 3.5–5.1)
Sodium: 138 mmol/L (ref 135–145)

## 2020-05-28 LAB — LITHIUM LEVEL: Lithium Lvl: 0.54 mmol/L — ABNORMAL LOW (ref 0.60–1.20)

## 2020-05-28 LAB — TSH: TSH: 4.489 u[IU]/mL (ref 0.350–4.500)

## 2020-05-28 MED ORDER — LITHIUM CARBONATE ER 300 MG PO TBCR
600.0000 mg | EXTENDED_RELEASE_TABLET | Freq: Two times a day (BID) | ORAL | Status: DC
Start: 2020-05-28 — End: 2020-05-30
  Administered 2020-05-28 – 2020-05-30 (×4): 600 mg via ORAL
  Filled 2020-05-28 (×6): qty 2

## 2020-05-28 NOTE — [facility] Group Notes (Signed)
Adult Psychoeducational Group Note  Date:  05/28/2020 Time:  9:41 AM  Group Topic/Focus:  Goals Group:   The focus of this group is to help patients establish daily goals to achieve during treatment and discuss how the patient can incorporate goal setting into their daily lives to aide in recovery.  Participation Level:  Active  Participation Quality:  Appropriate and Attentive  Affect:  Appropriate  Cognitive:  Appropriate  Insight: Appropriate and Good  Engagement in Group:  Engaged  Modes of Intervention:  Discussion  Additional Comments:  The quote of the group was to work on your goal everyday. Pt felt that he has completed all of his objectives here. Pt lists them as working on having a better attitude, medication management and having dome more resources.   Craig [name] [name] 05/28/2020, 9:41 AM

## 2020-05-28 NOTE — Progress Notes (Signed)
Recreation Therapy Notes  Date: 12.22.21 Time: 1000 Location: 500 Hall Dayroom  Group Topic: Arts and Crafts  Goal Area(s) Addresses:  Patient will participate in the creative process to complete all crafts.  Patient will interact pro-socially with staff and peers.  Patient will follow directions on the 1st prompt.   Behavioral Response: Engaged  Intervention: Tourist information centre manager- printed paper templates, safety scissors, glue, glitter, red/green/white construction paper, markers, colored pencils and chalk  Activity: LRT facilitated a therapeutic art activity to encourage self-expression and creativity in recognition of the approaching holidays. Patients had the options to cut out christmas trees, santa hat, tree ornaments, candy canes or free style and Christmas design they could come up with.  Patients were to then decorate their cut outs with the supplies provided.  Education: Socialization, Leisure Education  Education Outcome: Acknowledges understanding  Clinical Observations/Feedback: Pt was social with peers.  Pt gave positive complements to peers on their projects.  Pt helped cut out the [employer] and ornaments.  Pt also sat sang along to some of the Christmas music that was playing.    Caroll Rancher, LRT/CTRS     Caroll Rancher A 05/28/2020 1:07 PM

## 2020-05-28 NOTE — Progress Notes (Addendum)
[facility] MD Progress Note  05/28/2020 11:18 AM Craig [name]  MRN:  [id number] Subjective:  This AM patient reports that he slept well and his mood is " great." When asked about his appetite patient reports he is eating "very well" but also trying to watch his weight and then goes on to talk about his goal weight. Patient contracts for safety and does not endorse HI nor AVH.   Of note writer noted that patient had changed pants today and patient was very happy about this. Patient also appeared more focus and his responses made more sense. Patient does continue to believe he is Messiah but he did not go on a tirade about the church today. Writer attempted to ask patient the name of his group home but patient did not know but he did try to give a landmark and talked about his friends in the home and their plans for when he returns to have a Christmas party. Principal Problem: Severe manic bipolar 1 disorder with psychotic behavior (HCC) Diagnosis: Principal Problem:   Severe manic bipolar 1 disorder with psychotic behavior (HCC) Active Problems:   High risk medication use   Nicotine abuse  Total Time spent with patient: 15 minutes  Past Psychiatric History: See H&P  Past Medical History:  Past Medical History:  Diagnosis Date  . Bipolar 1 disorder (HCC)   . COPD (chronic obstructive pulmonary disease) (HCC)   . PTSD (post-traumatic stress disorder)   . Stroke [facility])    left-sided weakness, around 2008    Past Surgical History:  Procedure Laterality Date  . APPENDECTOMY    . BACK SURGERY    . CHOLECYSTECTOMY N/A 02/28/2017   Procedure: LAPAROSCOPIC CHOLECYSTECTOMY, possible open;  Surgeon: Lucretia Roers, MD;  Location: [facility];  Service: General;  Laterality: N/A;   Family History:  Family History  Problem Relation Age of Onset  . Colon cancer Brother        diagnosed around age 41   . Pancreatic cancer Neg Hx   . Pancreatic disease Neg Hx    Family Psychiatric  History: See  H&P Social History:  Social History   Substance and Sexual Activity  Alcohol Use No  . Alcohol/week: 1.0 standard drink  . Types: 1 Glasses of wine per week   Comment: occ     Social History   Substance and Sexual Activity  Drug Use No    Social History   Socioeconomic History  . Marital status: Widowed    Spouse name: Not on file  . Number of children: Not on file  . Years of education: Not on file  . Highest education level: Not on file  Occupational History  . Not on file  Tobacco Use  . Smoking status: Current Every Day Smoker    Packs/day: 0.50    Types: Cigarettes  . Smokeless tobacco: Never Used  . Tobacco comment: last smoke yesterday  Vaping Use  . Vaping Use: Former  Substance and Sexual Activity  . Alcohol use: No    Alcohol/week: 1.0 standard drink    Types: 1 Glasses of wine per week    Comment: occ  . Drug use: No  . Sexual activity: Yes  Other Topics Concern  . Not on file  Social History Narrative  . Not on file   Social Determinants of Health   Financial Resource Strain: Not on file  Food Insecurity: Not on file  Transportation Needs: Not on file  Physical Activity: Not on  file  Stress: Not on file  Social Connections: Not on file   Additional Social History:                         Sleep: Fair  Appetite:  Good  Current Medications: Current Facility-Administered Medications  Medication Dose Route Frequency Provider Last Rate Last Admin  . acetaminophen (TYLENOL) tablet 650 mg  650 mg Oral Q6H PRN Aldean Baker, NP   650 mg at 05/26/20 1346  . albuterol (VENTOLIN HFA) 108 (90 Base) MCG/ACT inhaler 1-2 puff  1-2 puff Inhalation Q6H PRN Aldean Baker, NP   2 puff at 05/28/20 0845  . alum & mag hydroxide-simeth (MAALOX/MYLANTA) 200-200-20 MG/5ML suspension 30 mL  30 mL Oral Q4H PRN Aldean Baker, NP      . gabapentin (NEURONTIN) capsule 300 mg  300 mg Oral BID Aldean Baker, NP   300 mg at 05/28/20 0840  . hydrOXYzine  (ATARAX/VISTARIL) tablet 25 mg  25 mg Oral TID PRN Aldean Baker, NP   25 mg at 05/27/20 2238  . lithium carbonate (ESKALITH) CR tablet 450 mg  450 mg Oral BID Eliseo Gum B, MD   450 mg at 05/28/20 0840  . ziprasidone (GEODON) injection 20 mg  20 mg Intramuscular Q12H PRN Nwoko, Uchenna E, PA   20 mg at 05/25/20 2250   And  . LORazepam (ATIVAN) tablet 1 mg  1 mg Oral PRN Nwoko, Uchenna E, PA      . magnesium hydroxide (MILK OF MAGNESIA) suspension 30 mL  30 mL Oral Daily PRN Aldean Baker, NP      . methocarbamol (ROBAXIN) tablet 750 mg  750 mg Oral Q6H PRN Antonieta Pert, MD   750 mg at 05/27/20 2238  . nicotine (NICODERM CQ - dosed in mg/24 hours) patch 14 mg  14 mg Transdermal Daily Nira Conn A, NP   14 mg at 05/28/20 0843  . OLANZapine (ZYPREXA) tablet 10 mg  10 mg Oral Daily Eliseo Gum B, MD   10 mg at 05/28/20 0840  . OLANZapine (ZYPREXA) tablet 20 mg  20 mg Oral QHS Eliseo Gum B, MD   20 mg at 05/27/20 2045  . OLANZapine zydis (ZYPREXA) disintegrating tablet 5 mg  5 mg Oral Q8H PRN Aldean Baker, NP   5 mg at 05/27/20 1618  . traMADol (ULTRAM) tablet 50 mg  50 mg Oral Q6H PRN Antonieta Pert, MD   50 mg at 05/27/20 2238  . traZODone (DESYREL) tablet 50 mg  50 mg Oral QHS PRN,MR X 1 Aldean Baker, NP   50 mg at 05/27/20 2045    Lab Results:  Results for orders placed or performed during the hospital encounter of 05/20/20 (from the past 48 hour(s))  Lithium level     Status: Abnormal   Collection Time: 05/28/20  6:54 AM  Result Value Ref Range   Lithium Lvl 0.54 (L) 0.60 - 1.20 mmol/L    Comment: Performed at [facility], [address] [place], [place] [address]  Basic metabolic panel     Status: Abnormal   Collection Time: 05/28/20  6:54 AM  Result Value Ref Range   Sodium 138 135 - 145 mmol/L   Potassium 4.2 3.5 - 5.1 mmol/L   Chloride 105 98 - 111 mmol/L   CO2 26 22 - 32 mmol/L   Glucose, Bld 107 (H) 70 - 99 mg/dL  Comment: Glucose  reference range applies only to samples taken after fasting for at least 8 hours.   BUN 15 6 - 20 mg/dL   Creatinine, Ser 4.090.83 0.61 - 1.24 mg/dL   Calcium 9.3 8.9 - 81.110.3 mg/dL   GFR, Estimated >91>60 >47>60 mL/min    Comment: (NOTE) Calculated using the CKD-EPI Creatinine Equation (2021)    Anion gap 7 5 - 15    Comment: Performed at [facility], [address] [place], [place] [address]  TSH     Status: None   Collection Time: 05/28/20  6:54 AM  Result Value Ref Range   TSH 4.489 0.350 - 4.500 uIU/mL    Comment: Performed by a 3rd Generation assay with a functional sensitivity of <=0.01 uIU/mL. Performed at [facility], [address] [place], [place] [address]     Blood Alcohol level:  Lab Results  Component Value Date   [facility] 05/19/2020   ETH <11 02/09/2013    Metabolic Disorder Labs: Lab Results  Component Value Date   HGBA1C 5.4 05/21/2020   MPG 108.28 05/21/2020   No results found for: PROLACTIN Lab Results  Component Value Date   CHOL 148 05/21/2020   TRIG 80 05/21/2020   HDL 49 05/21/2020   CHOLHDL 3.0 05/21/2020   VLDL 16 05/21/2020   LDLCALC 83 05/21/2020   LDLCALC 67 02/23/2017    Physical Findings: AIMS: Facial and Oral Movements Muscles of Facial Expression: None, normal Lips and Perioral Area: None, normal Jaw: None, normal Tongue: None, normal,Extremity Movements Upper (arms, wrists, hands, fingers): None, normal Lower (legs, knees, ankles, toes): None, normal, Trunk Movements Neck, shoulders, hips: None, normal, Overall Severity Severity of abnormal movements (highest score from questions above): None, normal Incapacitation due to abnormal movements: None, normal Patient's awareness of abnormal movements (rate only patient's report): No Awareness, Dental Status Current problems with teeth and/or dentures?: No Does patient usually wear dentures?: No  CIWA:    COWS:     Musculoskeletal: Strength & Muscle  Tone: within normal limits Gait & Station: normal Patient leans: Front  Psychiatric Specialty Exam: Physical Exam HENT:     Head: Normocephalic and atraumatic.  Pulmonary:     Effort: Pulmonary effort is normal.  Neurological:     Mental Status: He is alert.     Review of Systems  Cardiovascular: Negative for chest pain.  Gastrointestinal: Negative for abdominal pain.  Neurological: Positive for headaches.    Blood pressure 122/74, pulse 85, temperature 97.9 F (36.6 C), temperature source Oral, resp. rate 20, height 5\' 6"  (1.676 m), weight 82.1 kg, SpO2 98 %.Body mass index is 29.21 kg/m.  General Appearance: Casually dressed, well engaged with examiner, appears older than stated age  Eye Contact:  Good  Speech:  Pressured  Volume:  Increased  Mood:  Euphoric  Affect:  Congruent  Thought Process:  Disorganized, tangential  Orientation:  Oriented to self, place, time  Thought Content:  Grandiose delusions; denies AVH, ideas of reference, or first rank symptoms, denies paranoia; no obsessions or compulsions  Suicidal Thoughts:  No  Homicidal Thoughts:  No  Memory:  Recent;   Fair  Judgement:  Other:  Improving  Insight:  Shallow  Psychomotor Activity:  Normal  Concentration:  Concentration: Significantly improved  Recall:  NA  Fund of Knowledge:  Fair  Language:  Good  Akathisia:  No  Handed:  Right  AIMS (if indicated):     Assets:  Desire for  Improvement Housing Leisure Time  ADL's:  Intact  Cognition:  WNL  Sleep:  Number of Hours: 5   Treatment Plan Summary: Daily contact with patient to assess and evaluate symptoms and progress in treatment  Mr. Sandersis a 59 year old male with a suspected past psychiatric history significant for bipolar disorder who was admitted secondary to exacerbation of his bipolar symptoms. Patient continues to endorse delusions of grandeur but was much more focused today on exam. Patient was able to stay on topic when asked a question  and while his speech is still pressured he seemed less wild eyed and expansive in his body language. Patient Dierdre Searles level is still subtherapeutic but is increased from first test. Patient is showing incremental improvement on his current regimen. SW is working to confirm his group home for safe dispo as patient is beginning to sleep more and interacting more appropriately with others. At this time patient does not know the name of his group home. Patient TSH and Na were WNL.  Severe manic bipolar 1 disorder w/ psychotic behavior -Continue Gabapentin 300 TID to help with anxiety and mood stabilization - Lithium level today 0.54 with TSH 4.489 creatinine 0.83 and Na+ 138 - will increase Lithium CR to 600mg  BID for management of residual mania with plans to recheck TSH, creatinine, electrolytes and Lithium level in 3 days after dose adjustment - Continue Zyprexa10mg  daily and 20mg  QHS (Metabolic profile and EKG monitoring while on an antipsychotic: Total cholesterol 148, HDL 49, LDL 83, triglycerides 80; 5.4; QTc 405)  High Risk Medication Use: The complexity of this patient's case involves drug therapy with Lithium which requires intensive monitoring for toxicity. This is in part due to a narrow therapeutic window as well as potential for toxicity with this medication.   Tobacco use disorder - Nicotine 14mg  patch  Severe kyphosis - Robaxin 750mg  q6h PRN, for muscle spasms  PRN -Tylenol 650mg  q6h - Albuterol inhalers -Maalox 39ml q4h -Atarax 25mg  TID, anxiety  Agitation protocol - Zyprexa 5mg  - Ativan1mg   - Geodon 20mg  IM  -Milk of Mag 26mL -Trazodone 50mg x 1QHS  PGY-1 , MD 05/28/2020, 11:18 AM

## 2020-05-28 NOTE — [facility] Group Notes (Signed)
[facility] LCSW Group Therapy  05/28/2020 2:22 PM  Type of Therapy:  Group Therapy  Participation Level:  Active  Participation Quality:  Sharing  Affect:  Excited  Cognitive:  Disorganized  Insight:  Developing/Improving  Engagement in Therapy:  Engaged  Modes of Intervention:  Activity, Discussion and Socialization  Summary of Progress/Problems: Patient attended and participated in group. Patient shared that he likes to garden, go on walks, and listen to music to cope.    Rivka Baune A [facility] [place] 05/28/2020, 2:22 PM

## 2020-05-28 NOTE — Progress Notes (Addendum)
Pt remains intrusive towards both peers and staff, delusional, grandeur in nature on interactions. Promises to come back to facility post d/c to buy equipment for staff to use on the unit "I can afford all that". Speech is still pressure. Currently denies SI, HI, AVH and pain at this time. Received PRN Tramadol, Robaxin and Tylenol as ordered for complain of back pain and muscle spasms. Attended scheduled unit groups, actively engaged in conversations and activities. Remains verbally redirectable when intrusive. Tolerates meals and medications well without discomfort to note at this time.  Emotional support, encouragement and reassurance provided to pt throughout this shift as needed. Scheduled and PRN medications given as ordered with verbal education and effects monitored. Q 15 minutes safety checks and fall precaution maintained without incident thus far.  Pt receptive to care. Denies concerns at this time. Remains safe on and off unit.

## 2020-05-28 NOTE — Progress Notes (Signed)
[facility] Group Notes:  (Nursing/MHT/Case Management/Adjunct)  Date:  05/28/2020  Time:  10:21 PM  Type of Therapy:  wrap up  Participation Level:  Active  Participation Quality:  Appropriate and Attentive  Affect:  Appropriate  Cognitive:  Appropriate  Insight:  Improving  Engagement in Group:  Engaged  Modes of Intervention:  Discussion   Summary of Progress/Problems: Pt stated he was feeling much better, and thankful for staff helping him while being here. 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   Delos Haring 05/28/2020, 10:21 PM

## 2020-05-29 NOTE — Progress Notes (Addendum)
[facility] MD Progress Note  05/29/2020 11:34 AM Craig [name]  MRN:  [id number] Subjective:  Patient was busy again working on small projects in his room and had laid his gown out on the opposite bed for others to sit on. Patient also had a bright orange shirt tied around his neck today appearing to put more effort into his "style." Patient reports that he slept well last night and that he is eating well and trying to have adequate fluid intake. Patient reports that his mood is "so-so" but he is happy to show a picture he just drew and taped to the bottom of his food tray. Patient continues to endorse that he is the Messiah and has power over the world.  Principal Problem: Severe manic bipolar 1 disorder with psychotic behavior (HCC) Diagnosis: Principal Problem:   Severe manic bipolar 1 disorder with psychotic behavior (HCC) Active Problems:   High risk medication use   Nicotine abuse  Total Time spent with patient: 15 minutes  Past Psychiatric History: See H&P  Past Medical History:  Past Medical History:  Diagnosis Date   Bipolar 1 disorder (HCC)    COPD (chronic obstructive pulmonary disease) (HCC)    PTSD (post-traumatic stress disorder)    Stroke (HCC)    left-sided weakness, around 2008    Past Surgical History:  Procedure Laterality Date   APPENDECTOMY     BACK SURGERY     CHOLECYSTECTOMY N/A 02/28/2017   Procedure: LAPAROSCOPIC CHOLECYSTECTOMY, possible open;  Surgeon: Lucretia Roers, MD;  Location: [facility];  Service: General;  Laterality: N/A;   Family History:  Family History  Problem Relation Age of Onset   Colon cancer Brother        diagnosed around age 67    Pancreatic cancer Neg Hx    Pancreatic disease Neg Hx    Family Psychiatric  History: See H&P Social History:  Social History   Substance and Sexual Activity  Alcohol Use No   Alcohol/week: 1.0 standard drink   Types: 1 Glasses of wine per week   Comment: occ     Social History    Substance and Sexual Activity  Drug Use No    Social History   Socioeconomic History   Marital status: Widowed    Spouse name: Not on file   Number of children: Not on file   Years of education: Not on file   Highest education level: Not on file  Occupational History   Not on file  Tobacco Use   Smoking status: Current Every Day Smoker    Packs/day: 0.50    Types: Cigarettes   Smokeless tobacco: Never Used   Tobacco comment: last smoke yesterday  Vaping Use   Vaping Use: Former  Substance and Sexual Activity   Alcohol use: No    Alcohol/week: 1.0 standard drink    Types: 1 Glasses of wine per week    Comment: occ   Drug use: No   Sexual activity: Yes  Other Topics Concern   Not on file  Social History Narrative   Not on file   Social Determinants of Health   Financial Resource Strain: Not on file  Food Insecurity: Not on file  Transportation Needs: Not on file  Physical Activity: Not on file  Stress: Not on file  Social Connections: Not on file   Additional Social History:  Sleep: Fair  Appetite:  Good  Current Medications: Current Facility-Administered Medications  Medication Dose Route Frequency Provider Last Rate Last Admin   acetaminophen (TYLENOL) tablet 650 mg  650 mg Oral Q6H PRN Aldean Baker, NP   650 mg at 05/29/20 0635   albuterol (VENTOLIN HFA) 108 (90 Base) MCG/ACT inhaler 1-2 puff  1-2 puff Inhalation Q6H PRN Aldean Baker, NP   2 puff at 05/29/20 0830   alum & mag hydroxide-simeth (MAALOX/MYLANTA) 200-200-20 MG/5ML suspension 30 mL  30 mL Oral Q4H PRN Aldean Baker, NP       gabapentin (NEURONTIN) capsule 300 mg  300 mg Oral BID Aldean Baker, NP   300 mg at 05/29/20 3295   hydrOXYzine (ATARAX/VISTARIL) tablet 25 mg  25 mg Oral TID PRN Aldean Baker, NP   25 mg at 05/29/20 1884   lithium carbonate (LITHOBID) CR tablet 600 mg  600 mg Oral BID Bartholomew Crews E, MD   600 mg at 05/29/20  1660   ziprasidone (GEODON) injection 20 mg  20 mg Intramuscular Q12H PRN Nwoko, Uchenna E, PA   20 mg at 05/25/20 2250   And   LORazepam (ATIVAN) tablet 1 mg  1 mg Oral PRN Nwoko, Uchenna E, PA       magnesium hydroxide (MILK OF MAGNESIA) suspension 30 mL  30 mL Oral Daily PRN Aldean Baker, NP       methocarbamol (ROBAXIN) tablet 750 mg  750 mg Oral Q6H PRN Antonieta Pert, MD   750 mg at 05/29/20 0200   nicotine (NICODERM CQ - dosed in mg/24 hours) patch 14 mg  14 mg Transdermal Daily Nira Conn A, NP   14 mg at 05/28/20 0843   OLANZapine (ZYPREXA) tablet 10 mg  10 mg Oral Daily Eliseo Gum B, MD   10 mg at 05/29/20 0828   OLANZapine (ZYPREXA) tablet 20 mg  20 mg Oral QHS Eliseo Gum B, MD   20 mg at 05/28/20 2121   OLANZapine zydis (ZYPREXA) disintegrating tablet 5 mg  5 mg Oral Q8H PRN Aldean Baker, NP   5 mg at 05/27/20 1618   traMADol (ULTRAM) tablet 50 mg  50 mg Oral Q6H PRN Antonieta Pert, MD   50 mg at 05/29/20 0200   traZODone (DESYREL) tablet 50 mg  50 mg Oral QHS PRN,MR X 1 Aldean Baker, NP   50 mg at 05/27/20 2045    Lab Results:  Results for orders placed or performed during the hospital encounter of 05/20/20 (from the past 48 hour(s))  Lithium level     Status: Abnormal   Collection Time: 05/28/20  6:54 AM  Result Value Ref Range   Lithium Lvl 0.54 (L) 0.60 - 1.20 mmol/L    Comment: Performed at [facility], [address] [place], [place] [address]  Basic metabolic panel     Status: Abnormal   Collection Time: 05/28/20  6:54 AM  Result Value Ref Range   Sodium 138 135 - 145 mmol/L   Potassium 4.2 3.5 - 5.1 mmol/L   Chloride 105 98 - 111 mmol/L   CO2 26 22 - 32 mmol/L   Glucose, Bld 107 (H) 70 - 99 mg/dL    Comment: Glucose reference range applies only to samples taken after fasting for at least 8 hours.   BUN 15 6 - 20 mg/dL   Creatinine, Ser 0.10 0.61 - 1.24 mg/dL   Calcium 9.3 8.9 - 10.3  mg/dL   GFR, Estimated >44  >81 mL/min    Comment: (NOTE) Calculated using the CKD-EPI Creatinine Equation (2021)    Anion gap 7 5 - 15    Comment: Performed at [facility], [address] [place], [place] [address]  TSH     Status: None   Collection Time: 05/28/20  6:54 AM  Result Value Ref Range   TSH 4.489 0.350 - 4.500 uIU/mL    Comment: Performed by a 3rd Generation assay with a functional sensitivity of <=0.01 uIU/mL. Performed at [facility], [address] [place], [place] [address]     Blood Alcohol level:  Lab Results  Component Value Date   [facility] <10 05/19/2020   ETH <11 02/09/2013    Metabolic Disorder Labs: Lab Results  Component Value Date   HGBA1C 5.4 05/21/2020   MPG 108.28 05/21/2020   No results found for: PROLACTIN Lab Results  Component Value Date   CHOL 148 05/21/2020   TRIG 80 05/21/2020   HDL 49 05/21/2020   CHOLHDL 3.0 05/21/2020   VLDL 16 05/21/2020   LDLCALC 83 05/21/2020   LDLCALC 67 02/23/2017    Physical Findings: AIMS: Facial and Oral Movements Muscles of Facial Expression: None, normal Lips and Perioral Area: None, normal Jaw: None, normal Tongue: None, normal,Extremity Movements Upper (arms, wrists, hands, fingers): None, normal Lower (legs, knees, ankles, toes): None, normal, Trunk Movements Neck, shoulders, hips: None, normal, Overall Severity Severity of abnormal movements (highest score from questions above): None, normal Incapacitation due to abnormal movements: None, normal Patient's awareness of abnormal movements (rate only patient's report): No Awareness, Dental Status Current problems with teeth and/or dentures?: No Does patient usually wear dentures?: No  CIWA:    COWS:     Musculoskeletal: Strength & Muscle Tone: within normal limits Gait & Station: normal Patient leans: Front  Psychiatric Specialty Exam: Physical Exam HENT:     Head: Normocephalic and atraumatic.  Pulmonary:     Effort:  Pulmonary effort is normal.  Neurological:     Mental Status: He is alert.     Review of Systems  Cardiovascular: Negative for chest pain.  Gastrointestinal: Negative for abdominal pain.  Neurological: Negative for headaches.    Blood pressure 129/75, pulse 77, temperature (!) 97.4 F (36.3 C), temperature source Oral, resp. rate 20, height 5\' 6"  (1.676 m), weight 82.1 kg, SpO2 98 %.Body mass index is 29.21 kg/m.  General Appearance: Bizarre  Eye Contact:  Fair  Speech:  Pressured  Volume:  Increased  Mood:  Euphoric  Affect:  Congruent  Thought Process:  Disorganized  Orientation:  Full (Time, Place, and Person)  Thought Content:  Delusions  Suicidal Thoughts:  No  Homicidal Thoughts:  No  Memory:  NA  Judgement:  Other:  Improving  Insight:  Shallow  Psychomotor Activity:  Increased  Concentration:  Concentration: Poor  Recall:  NA  Fund of Knowledge:  NA  Language:  Good  Akathisia:  No  Handed:  Right  AIMS (if indicated):     Assets:  Housing  ADL's:  Intact  Cognition:  WNL  Sleep:  Number of Hours: 6.5     Treatment Plan Summary: Daily contact with patient to assess and evaluate symptoms and progress in treatment  Mr. Sandersis a 59 year old male with a suspected past psychiatric history significant for bipolar disorder who was admitted secondary to exacerbation of his bipolar symptoms. Patient continues to endorse delusions of grandeur and was back  to doing his small projects in his room. Patient was very tangential today. Will need to reassess Li level Sunday. Patient does show some improvement but continues to be very manic. SW continues to work on finding patient's housing. Severe manic bipolar 1 disorder w/ psychotic behavior -Continue Gabapentin 300 BID to help with anxiety and mood stabilization - Lithium level most recent 0.54 with TSH 4.489 creatinine 0.83 and Na+ 138 - will continue Lithium CR to 600mg  BID for management of residual mania with plans to  recheck TSH, creatinine, electrolytes and Lithium level in 3 days after dose adjustment - Continue Zyprexa10mg  daily and 20mg  QHS (Metabolic profile and EKG monitoring while on an antipsychotic: Total cholesterol 148, HDL 49, LDL 83, triglycerides 80; [id number] 5.4; QTc 405)  High Risk Medication Use:The complexity of this patient's case involves drug therapy with Lithiumwhich requires intensive monitoring for toxicity. This is in part due to a narrow therapeutic window as well as potential for toxicity with this medication.   Tobacco use disorder - Nicotine 14mg  patch  Severe kyphosis -Robaxin 750mg q6hPRN, for muscle spasms  PRN -Tylenol 650mg  q6h - Albuterol inhalers -Maalox 30ml q4h -Atarax 25mg  TID, anxiety  Agitation protocol - Zyprexa 5mg  - Ativan1mg   - Geodon 20mg  IM  -Milk of Mag 30mL -Trazodone 50mg x 1QHS PGY-1 Bobbye MortonJai B Chaske Paskett, MD 05/29/2020, 11:34 AM

## 2020-05-29 NOTE — [facility] Suicide Risk Assessment (Signed)
[facility] INPATIENT:  Family/Significant Other Suicide Prevention Education  Suicide Prevention Education:  Contact Attempts: Sister, Craig [name] has been identified by the patient as the family member/significant other with whom the patient will be residing, and identified as the person(s) who will aid the patient in the event of a mental health crisis.  With written consent from the patient, two attempts were made to provide suicide prevention education, prior to and/or following the patient's discharge.  We were unsuccessful in providing suicide prevention education.  A suicide education pamphlet was given to the patient to share with family/significant other.   CSW unable to reach patients contact due to having no number on file and patient unable to provide number.   Unable to reach supports, SPE pamphlet placed on chart for patient to share with supports at discharge.    Ruthann Cancer MSW, LCSW Clincal Social Worker  [facility]

## 2020-05-29 NOTE — Progress Notes (Signed)
Pt given Vistaril 6:30 this morning.  Pt given Tylenol for pain 6:30 this morning.

## 2020-05-29 NOTE — [facility] Counselor (Signed)
CSW spoke with Willaim Rayas at [facility] [phone] or [phone]), who reported that this patient is a resident of their facility and is able to return at discharge.   [facility] Address: [address] [place], [place] [address]    Ruthann Cancer MSW, LCSW Clincal Social Worker  [facility]

## 2020-05-29 NOTE — Progress Notes (Addendum)
   05/29/20 2200  Psych Admission Type (Psych Patients Only)  Admission Status Voluntary  Psychosocial Assessment  Patient Complaints Anxiety;Suspiciousness  Eye Contact Fair  Facial Expression Animated;Anxious  Affect Anxious;Labile  Speech Pressured;Loud  Interaction Assertive;Guarded  Motor Activity Fidgety;Unsteady  Appearance/Hygiene Unremarkable  Behavior Characteristics Cooperative;Fidgety;Restless  Mood Anxious;Preoccupied  Thought Chartered certified accountant of ideas;Loose associations;Tangential  Content Preoccupation;Paranoia  Delusions Paranoid  Perception Derealization  Hallucination None reported or observed  Judgment Limited  Confusion Mild  Danger to Self  Current suicidal ideation? Denies  Danger to Others  Danger to Others None reported or observed   Pt given vistaril per MAR.  Pt given Tramadol, Robaxin, and Trazadone per MAR.

## 2020-05-29 NOTE — Progress Notes (Addendum)
Pt up and awake from sleep complaining of back pain. Pt was given Tramadol and Robaxin at 0200.

## 2020-05-29 NOTE — [facility] Group Notes (Signed)
Occupational Therapy Group Note Date: 05/29/2020 Group Topic/Focus: Relaxation  Group Description: Group encouraged increased engagement and participation through a guided imagery mindfulness practice to promote relaxation and symptom management. Patients were encouraged to share their own relaxation strategies/practices and were then encouraged to follow along with the guided imagery script provided. Patients reported benefit and feeling more relaxed post group.   Therapeutic Goal(s): Identify personal relaxation strategies and techniques to reduce stress and emotional distress Participation Level: Active   Participation Quality: Independent   Behavior: Calm and Cooperative   Speech/Thought Process: Focused   Affect/Mood: Euthymic   Insight: Fair   Judgement: Fair   Individualization: Craig [name] was active in their participation of group activity, appeared engaged in guided imagery script; observed with eyes closed, breathing, quiet/no interruptions. Pt identified feeling "tired" post activity and identified "just being at home in your own space is relaxing" as an additional strategy or activity they could engage in to promote relaxation.   Modes of Intervention: Activity, Education and Support  Patient Response to Interventions:  Attentive and Engaged   Plan: Continue to engage patient in OT groups 2 - 3x/week.  05/29/2020  Donne Hazel, MOT, OTR/L

## 2020-05-29 NOTE — Progress Notes (Addendum)
   05/29/20 0100  Psych Admission Type (Psych Patients Only)  Admission Status Voluntary  Psychosocial Assessment  Patient Complaints Anxiety;Hyperactivity;Suspiciousness  Eye Contact Fair  Facial Expression Animated;Anxious  Affect Anxious;Labile  Speech Argumentative;Pressured;Loud  Interaction Assertive;Guarded  Motor Activity Fidgety;Unsteady  Appearance/Hygiene Unremarkable  Behavior Characteristics Anxious;Fidgety;Guarded  Mood Anxious;Suspicious;Labile  Thought Chartered certified accountant of ideas;Loose associations;Tangential  Content Paranoia;Preoccupation  Delusions Paranoid  Perception Derealization  Hallucination None reported or observed  Judgment Limited  Confusion Mild  Danger to Self  Current suicidal ideation? Denies  Danger to Others  Danger to Others None reported or observed   Pt is A&O X4. Patient stated good sleep and good appetite. Pt rated energy level 8/10. Pt rated depression 0/10, anxiety 8/10. Pt denied SI/HI/AVH. Pt contracts for safety.

## 2020-05-29 NOTE — [facility] Group Notes (Signed)
The focus of this group is to help patients establish daily goals to achieve during treatment and discuss how the patient can incorporate goal setting into their daily lives to aide in recovery.   Patient did not attend group. 

## 2020-05-30 MED ORDER — LITHIUM CARBONATE ER 300 MG PO TBCR: 600.0000 mg | EXTENDED_RELEASE_TABLET | Freq: Two times a day (BID) | ORAL | 0 refills | Status: DC

## 2020-05-30 MED ORDER — OLANZAPINE 10 MG PO TABS: 10.0000 mg | ORAL_TABLET | Freq: Every day | ORAL | 0 refills | Status: DC

## 2020-05-30 MED ORDER — GABAPENTIN 300 MG PO CAPS: 300.0000 mg | ORAL_CAPSULE | Freq: Two times a day (BID) | ORAL | 0 refills | Status: DC

## 2020-05-30 MED ORDER — OLANZAPINE 20 MG PO TABS: 20.0000 mg | ORAL_TABLET | Freq: Every day | ORAL | 0 refills | Status: DC

## 2020-05-30 MED ORDER — HYDROXYZINE HCL 25 MG PO TABS: 25.0000 mg | ORAL_TABLET | Freq: Three times a day (TID) | ORAL | 0 refills | Status: DC | PRN

## 2020-05-30 MED ORDER — LEVOTHYROXINE SODIUM 75 MCG PO TABS
75.0000 ug | ORAL_TABLET | Freq: Every day | ORAL | Status: DC
  Filled 2020-05-30 (×2): qty 7

## 2020-05-30 MED ORDER — LEVOTHYROXINE SODIUM 75 MCG PO TABS: 75.0000 ug | ORAL_TABLET | Freq: Every day | ORAL | 0 refills | Status: AC

## 2020-05-30 NOTE — Progress Notes (Signed)
Pt discharged to lobby. Pt was stable and appreciative at that time. All papers, samples and prescriptions were given and valuables returned. Verbal understanding expressed. Denies SI/HI and A/VH. Pt given opportunity to express concerns and ask questions.  

## 2020-05-30 NOTE — [facility] Suicide Risk Assessment (Signed)
[facility] Discharge Suicide Risk Assessment   Principal Problem: Severe manic bipolar 1 disorder with psychotic behavior (HCC) Discharge Diagnoses: Principal Problem:   Severe manic bipolar 1 disorder with psychotic behavior (HCC) Active Problems:   High risk medication use   Nicotine abuse   Total Time spent with patient: 20 minutes  Musculoskeletal: Strength & Muscle Tone: within normal limits Gait & Station: broad based Patient leans: N/A  Psychiatric Specialty Exam: Review of Systems  All other systems reviewed and are negative.   Blood pressure 130/78, pulse 80, temperature 98.1 F (36.7 C), temperature source Oral, resp. rate 20, height 5\' 6"  (1.676 m), weight 82.1 kg, SpO2 97 %.Body mass index is 29.21 kg/m.  General Appearance: Casual  Eye Contact::  Fair  Speech:  Normal Rate409  Volume:  Normal  Mood:  Euphoric  Affect:  Congruent  Thought Process:  Coherent and Descriptions of Associations: Intact  Orientation:  Full (Time, Place, and Person)  Thought Content:  Tangential  Suicidal Thoughts:  No  Homicidal Thoughts:  No  Memory:  Immediate;   Fair Recent;   Fair Remote;   Fair  Judgement:  Intact  Insight:  Fair  Psychomotor Activity:  Increased  Concentration:  Fair  Recall:  [phone] of Knowledge:Fair  Language: Fair  Akathisia:  Negative  Handed:  Right  AIMS (if indicated):     Assets:  Desire for Improvement Housing Resilience  Sleep:  Number of Hours: 5.25  Cognition: WNL  ADL's:  Intact   Mental Status Per Nursing Assessment::   On Admission:  NA  Demographic Factors:  Male, Caucasian, Low socioeconomic status and Unemployed  Loss Factors: NA  Historical Factors: Impulsivity  Risk Reduction Factors:   Living with another person, especially a relative  Continued Clinical Symptoms:  Bipolar Disorder:   Mixed State  Cognitive Features That Contribute To Risk:  Thought constriction (tunnel vision)    Suicide Risk:  Minimal: No  identifiable suicidal ideation.  Patients presenting with no risk factors but with morbid ruminations; may be classified as minimal risk based on the severity of the depressive symptoms   Follow-up Information    Services, [facility]. Go on 06/05/2020.   Why: You have a hospital follow-up appointment on 06/05/20 at 9:00 am for therapy and medication management services.  This appointment will be held in person.  Please bring your photo ID, insurance card, and proof of any income. Contact information: [address] [place] [phone]               Plan Of Care/Follow-up recommendations:  Activity:  ad lib  [phone], MD 05/30/2020, 9:49 AM

## 2020-05-30 NOTE — Tx Team (Signed)
Interdisciplinary Treatment and Diagnostic Plan Update  05/30/2020 Time of Session: 9:15am Craig [name] MRN: [id number]  Principal Diagnosis: Severe manic bipolar 1 disorder with psychotic behavior (HCC)  Secondary Diagnoses: Principal Problem:   Severe manic bipolar 1 disorder with psychotic behavior (HCC) Active Problems:   High risk medication use   Nicotine abuse   Current Medications:  Current Facility-Administered Medications  Medication Dose Route Frequency Provider Last Rate Last Admin  . acetaminophen (TYLENOL) tablet 650 mg  650 mg Oral Q6H PRN Aldean Baker, NP   650 mg at 05/29/20 2037  . albuterol (VENTOLIN HFA) 108 (90 Base) MCG/ACT inhaler 1-2 puff  1-2 puff Inhalation Q6H PRN Aldean Baker, NP   2 puff at 05/29/20 0830  . alum & mag hydroxide-simeth (MAALOX/MYLANTA) 200-200-20 MG/5ML suspension 30 mL  30 mL Oral Q4H PRN Aldean Baker, NP      . gabapentin (NEURONTIN) capsule 300 mg  300 mg Oral BID Aldean Baker, NP   300 mg at 05/30/20 0804  . hydrOXYzine (ATARAX/VISTARIL) tablet 25 mg  25 mg Oral TID PRN Aldean Baker, NP   25 mg at 05/29/20 2037  . lithium carbonate (LITHOBID) CR tablet 600 mg  600 mg Oral BID Bartholomew Crews E, MD   600 mg at 05/30/20 0804  . ziprasidone (GEODON) injection 20 mg  20 mg Intramuscular Q12H PRN Nwoko, Uchenna E, PA   20 mg at 05/25/20 2250   And  . LORazepam (ATIVAN) tablet 1 mg  1 mg Oral PRN Nwoko, Uchenna E, PA      . magnesium hydroxide (MILK OF MAGNESIA) suspension 30 mL  30 mL Oral Daily PRN Aldean Baker, NP      . methocarbamol (ROBAXIN) tablet 750 mg  750 mg Oral Q6H PRN Antonieta Pert, MD   750 mg at 05/30/20 0039  . nicotine (NICODERM CQ - dosed in mg/24 hours) patch 14 mg  14 mg Transdermal Daily Nira Conn A, NP   14 mg at 05/30/20 0804  . OLANZapine (ZYPREXA) tablet 10 mg  10 mg Oral Daily Eliseo Gum B, MD   10 mg at 05/30/20 0806  . OLANZapine (ZYPREXA) tablet 20 mg  20 mg Oral QHS Eliseo Gum B, MD    20 mg at 05/29/20 2036  . OLANZapine zydis (ZYPREXA) disintegrating tablet 5 mg  5 mg Oral Q8H PRN Aldean Baker, NP   5 mg at 05/27/20 1618  . traMADol (ULTRAM) tablet 50 mg  50 mg Oral Q6H PRN Antonieta Pert, MD   50 mg at 05/30/20 0039  . traZODone (DESYREL) tablet 50 mg  50 mg Oral QHS PRN,MR X 1 Aldean Baker, NP   50 mg at 05/30/20 7564   PTA Medications: Medications Prior to Admission  Medication Sig Dispense Refill Last Dose  . busPIRone (BUSPAR) 15 MG tablet Take 15 mg by mouth 2 (two) times daily.     Marland Kitchen gabapentin (NEURONTIN) 300 MG capsule One tablet bid (Patient taking differently: Take 300 mg by mouth 2 (two) times daily. One tablet bid) 60 capsule 0   . lithium 300 MG tablet Take 1 tablet by mouth at bedtime.     Marland Kitchen tiZANidine (ZANAFLEX) 4 MG tablet Take 4 mg by mouth at bedtime.     Marland Kitchen albuterol (PROVENTIL HFA;VENTOLIN HFA) 108 (90 Base) MCG/ACT inhaler Inhale 2 puffs into the lungs every 6 (six) hours as needed for wheezing or shortness of breath. 1 Inhaler  2   . ipratropium-albuterol (DUONEB) 0.5-2.5 (3) MG/3ML SOLN Take 3 mLs by nebulization every 4 (four) hours as needed. 360 mL 1   . levothyroxine (SYNTHROID) 100 MCG tablet Take 100 mcg by mouth daily.     . pantoprazole (PROTONIX) 40 MG tablet Take 1 tablet (40 mg total) by mouth daily before breakfast. (Patient not taking: Reported on 05/20/2020) 30 tablet 1   . potassium chloride SA (K-DUR,KLOR-CON) 20 MEQ tablet Take 1 tablet (20 mEq total) by mouth 2 (two) times daily. (Patient not taking: Reported on 05/20/2020) 60 tablet 1     Patient Stressors: Other: pt endorses no stressors  Patient Strengths: Average or above average intelligence Supportive family/friends  Treatment Modalities: Medication Management, Group therapy, Case management,  1 to 1 session with clinician, Psychoeducation, Recreational therapy.   Physician Treatment Plan for Primary Diagnosis: Severe manic bipolar 1 disorder with psychotic  behavior (HCC) Long Term Goal(s): Improvement in symptoms so as ready for discharge Improvement in symptoms so as ready for discharge   Short Term Goals: Ability to identify changes in lifestyle to reduce recurrence of condition will improve Ability to verbalize feelings will improve Ability to demonstrate self-control will improve Ability to identify and develop effective coping behaviors will improve Ability to maintain clinical measurements within normal limits will improve Compliance with prescribed medications will improve Ability to identify triggers associated with substance abuse/mental health issues will improve Ability to identify changes in lifestyle to reduce recurrence of condition will improve Ability to verbalize feelings will improve Ability to demonstrate self-control will improve Ability to identify and develop effective coping behaviors will improve Ability to maintain clinical measurements within normal limits will improve Compliance with prescribed medications will improve Ability to identify triggers associated with substance abuse/mental health issues will improve  Medication Management: Evaluate patient's response, side effects, and tolerance of medication regimen.  Therapeutic Interventions: 1 to 1 sessions, Unit Group sessions and Medication administration.  Evaluation of Outcomes: Progressing  Physician Treatment Plan for Secondary Diagnosis: Principal Problem:   Severe manic bipolar 1 disorder with psychotic behavior (HCC) Active Problems:   High risk medication use   Nicotine abuse  Long Term Goal(s): Improvement in symptoms so as ready for discharge Improvement in symptoms so as ready for discharge   Short Term Goals: Ability to identify changes in lifestyle to reduce recurrence of condition will improve Ability to verbalize feelings will improve Ability to demonstrate self-control will improve Ability to identify and develop effective coping  behaviors will improve Ability to maintain clinical measurements within normal limits will improve Compliance with prescribed medications will improve Ability to identify triggers associated with substance abuse/mental health issues will improve Ability to identify changes in lifestyle to reduce recurrence of condition will improve Ability to verbalize feelings will improve Ability to demonstrate self-control will improve Ability to identify and develop effective coping behaviors will improve Ability to maintain clinical measurements within normal limits will improve Compliance with prescribed medications will improve Ability to identify triggers associated with substance abuse/mental health issues will improve     Medication Management: Evaluate patient's response, side effects, and tolerance of medication regimen.  Therapeutic Interventions: 1 to 1 sessions, Unit Group sessions and Medication administration.  Evaluation of Outcomes: Progressing   RN Treatment Plan for Primary Diagnosis: Severe manic bipolar 1 disorder with psychotic behavior (HCC) Long Term Goal(s): Knowledge of disease and therapeutic regimen to maintain health will improve  Short Term Goals: Ability to remain free from injury will improve, Ability  to verbalize frustration and anger appropriately will improve, Ability to demonstrate self-control, Ability to participate in decision making will improve, Ability to identify and develop effective coping behaviors will improve and Compliance with prescribed medications will improve  Medication Management: RN will administer medications as ordered by provider, will assess and evaluate patient's response and provide education to patient for prescribed medication. RN will report any adverse and/or side effects to prescribing provider.  Therapeutic Interventions: 1 on 1 counseling sessions, Psychoeducation, Medication administration, Evaluate responses to treatment, Monitor vital  signs and CBGs as ordered, Perform/monitor CIWA, COWS, AIMS and Fall Risk screenings as ordered, Perform wound care treatments as ordered.  Evaluation of Outcomes: Progressing   LCSW Treatment Plan for Primary Diagnosis: Severe manic bipolar 1 disorder with psychotic behavior (HCC) Long Term Goal(s): Safe transition to appropriate next level of care at discharge, Engage patient in therapeutic group addressing interpersonal concerns.  Short Term Goals: Engage patient in aftercare planning with referrals and resources, Increase social support, Increase ability to appropriately verbalize feelings, Increase emotional regulation, Identify triggers associated with mental health/substance abuse issues and Increase skills for wellness and recovery  Therapeutic Interventions: Assess for all discharge needs, 1 to 1 time with Social worker, Explore available resources and support systems, Assess for adequacy in community support network, Educate family and significant other(s) on suicide prevention, Complete Psychosocial Assessment, Interpersonal group therapy.  Evaluation of Outcomes: Progressing   Progress in Treatment: Attending groups: Yes. Participating in groups: Yes. Taking medication as prescribed: Yes. Toleration medication: Yes. Family/Significant other contact made: No, will contact:  unable to contact consent due to having no contact information Patient understands diagnosis: No. Discussing patient identified problems/goals with staff: Yes. Medical problems stabilized or resolved: Yes. Denies suicidal/homicidal ideation: Yes. Issues/concerns per patient self-inventory: No.   New problem(s) identified: No, Describe:  none  New Short Term/Long Term Goal(s): medication stabilization, elimination of SI thoughts, development of comprehensive mental wellness plan.   Patient Goals:  "To be in the middle of the [place]"  Discharge Plan or Barriers: Patient is to return to group  home to live and is to follow up with [facility] for continued medication management and therapy.  Reason for Continuation of Hospitalization: Delusions  Hallucinations Mania Medication stabilization  Estimated Length of Stay: 3-5 days  Attendees: Patient:  05/21/2020   Physician: 05/21/2020   Nursing:  05/21/2020   RN Care Manager: 05/21/2020  Social Worker: Jacinta Shoe, LCSW 05/21/2020   Recreational Therapist:  05/21/2020   Other:  05/21/2020  Other:  05/21/2020   Other: 05/21/2020       Scribe for Treatment Team: Jacinta Shoe, LCSW 05/30/2020 10:17 AM

## 2020-05-30 NOTE — Progress Notes (Signed)
°  [facility] Adult Case Management Discharge Plan :  Will you be returning to the same living situation after discharge:  Yes,  to group home At discharge, do you have transportation home?: No. Safe Transport will be arranged. Do you have the ability to pay for your medications: No. Samples to be provided at discharge   Release of information consent forms completed and in the chart;  Patient's signature needed at discharge.  Patient to Follow up at:  Follow-up Information    [facility], [facility]. Go on 06/05/2020.   Why: You have a hospital follow-up appointment on 06/05/20 at 9:00 am for therapy and medication management services.  This appointment will be held in person.  Please bring your photo ID, insurance card, and proof of any income. Contact information: [address] [place] [address] [phone]               Next level of care provider has access to [facility] Link:no  Safety Planning and Suicide Prevention discussed: Yes,  with patient  Have you used any form of tobacco in the last 30 days? (Cigarettes, Smokeless Tobacco, Cigars, and/or Pipes): Yes  Has patient been referred to the Quitline?: Patient refused referral  Patient has been referred for addiction treatment: Pt. refused referral  Otelia Santee, LCSW 05/30/2020, 10:32 AM

## 2020-05-30 NOTE — Discharge Summary (Signed)
Physician Discharge Summary Note  Patient:  Craig [name] is an 59 y.o., male MRN:  [id number] DOB:  05-12-1961 Patient phone:  [phone] (home)  Patient address:   [address] [place] [address],  Total Time spent with patient: 30 minutes  Date of Admission:  05/20/2020 Date of Discharge: 05/30/2020  Reason for Admission: Bipolar mania and psychosis.  Principal Problem: Severe manic bipolar 1 disorder with psychotic behavior [facility]) Discharge Diagnoses: Principal Problem:   Severe manic bipolar 1 disorder with psychotic behavior (HCC) Active Problems:   High risk medication use   Nicotine abuse   Past Psychiatric History: See admission H&P  Past Medical History:  Past Medical History:  Diagnosis Date   Bipolar 1 disorder (HCC)    COPD (chronic obstructive pulmonary disease) (HCC)    PTSD (post-traumatic stress disorder)    Stroke (HCC)    left-sided weakness, around 2008    Past Surgical History:  Procedure Laterality Date   APPENDECTOMY     BACK SURGERY     CHOLECYSTECTOMY N/A 02/28/2017   Procedure: LAPAROSCOPIC CHOLECYSTECTOMY, possible open;  Surgeon: Lucretia Roers, MD;  Location: [facility];  Service: General;  Laterality: N/A;   Family History:  Family History  Problem Relation Age of Onset   Colon cancer Brother        diagnosed around age 50    Pancreatic cancer Neg Hx    Pancreatic disease Neg Hx    Family Psychiatric  History: See admission H&P Social History:  Social History   Substance and Sexual Activity  Alcohol Use No   Alcohol/week: 1.0 standard drink   Types: 1 Glasses of wine per week   Comment: occ     Social History   Substance and Sexual Activity  Drug Use No    Social History   Socioeconomic History   Marital status: Widowed    Spouse name: Not on file   Number of children: Not on file   Years of education: Not on file   Highest education level: Not on file  Occupational History   Not on file  Tobacco  Use   Smoking status: Current Every Day Smoker    Packs/day: 0.50    Types: Cigarettes   Smokeless tobacco: Never Used   Tobacco comment: last smoke yesterday  Vaping Use   Vaping Use: Former  Substance and Sexual Activity   Alcohol use: No    Alcohol/week: 1.0 standard drink    Types: 1 Glasses of wine per week    Comment: occ   Drug use: No   Sexual activity: Yes  Other Topics Concern   Not on file  Social History Narrative   Not on file   Social Determinants of Health   Financial Resource Strain: Not on file  Food Insecurity: Not on file  Transportation Needs: Not on file  Physical Activity: Not on file  Stress: Not on file  Social Connections: Not on file    Hospital Course:  Patient is seen and examined. Patient is a 59 year old male with a reported past psychiatric history significant for bipolar disorder who originally presented to the [facility] emergency department on 05/19/2020 stating that he was there for "the revolution". On examination today the patient stated that he had self reduced his lithium dosage. He stated that while he was in prison his dosage had been decreased, but he was unable to drink 3 to 6 glasses of water a day to keep the lithium working well. Initially  he stated that he had gone to a family practice doctor to continue his medications, but then he stated that he was to be seen in January. He stated he had not seen a psychiatrist since he was released from prison in September of this year. He stated the only medicine that he was for sure that he took in the past was lithium, but he did recognize the names BuSpar, gabapentin. He apparently has been staying at a group home near [facility]. In the emergency room at [facility] he became agitated and required Geodon 10 mg IM x1. He told me he let me, appears to be euphoric, is pressured, poor historian and tangential. He was admitted to the hospital for evaluation and  stabilization.  During the course of the hospitalization the patient continued to show mania, tangentiality, pressured speech and psychosis.  During the course of the hospitalization his lithium level was increased and dosage increased.  He tolerated this without difficulty.  Additionally Zyprexa was added for more mood stability and sedation for sleep.  Throughout the course of the hospitalization he remained hypomanic at times, but was pleasant.  There was no evidence of suicidality or homicidality.  On 12/24 his lithium level had been 0.54 on 12/22.  This is when his last increase in lithium dosage was done.  His creatinine on 12/22 was 0.83.  Liver function enzymes were normal on that date as well.  His TSH was mildly elevated at 4.489, and his Synthroid dose was restarted at 75 mcg p.o. daily.  His EKG showed a sinus bradycardia with a normal QTc interval but some suggestion of a previous myocardial infarction.  On 12/24 he was doing well.  He remained hypomanic but was not a threat to himself or others.  He was not acutely psychotic.  His group home was contacted, and they were more than willing to take him back to their facility.  At discharge it was also recommended that he have a lithium level, TSH and a metabolic panel done on 12/26 to follow-up his lithium level after discharge.  Physical Findings: AIMS: Facial and Oral Movements Muscles of Facial Expression: None, normal Lips and Perioral Area: None, normal Jaw: None, normal Tongue: None, normal,Extremity Movements Upper (arms, wrists, hands, fingers): None, normal Lower (legs, knees, ankles, toes): None, normal, Trunk Movements Neck, shoulders, hips: None, normal, Overall Severity Severity of abnormal movements (highest score from questions above): None, normal Incapacitation due to abnormal movements: None, normal Patient's awareness of abnormal movements (rate only patient's report): No Awareness, Dental Status Current problems with  teeth and/or dentures?: No Does patient usually wear dentures?: No  CIWA:    COWS:     Musculoskeletal: Strength & Muscle Tone: within normal limits Gait & Station: broad based Patient leans: N/A  Psychiatric Specialty Exam: Physical Exam Vitals and nursing note reviewed.  Constitutional:      Appearance: Normal appearance.  HENT:     Head: Normocephalic and atraumatic.  Pulmonary:     Effort: Pulmonary effort is normal.  Neurological:     General: No focal deficit present.     Mental Status: He is alert and oriented to person, place, and time.     Review of Systems  Blood pressure 130/78, pulse 80, temperature 98.1 F (36.7 C), temperature source Oral, resp. rate 20, height 5\' 6"  (1.676 m), weight 82.1 kg, SpO2 97 %.Body mass index is 29.21 kg/m.  General Appearance: Fairly Groomed  Eye Contact:  Fair  Speech:  Normal Rate  Volume:  Normal  Mood:  Euphoric  Affect:  Congruent  Thought Process:  Coherent and Descriptions of Associations: Tangential  Orientation:  Full (Time, Place, and Person)  Thought Content:  Tangential  Suicidal Thoughts:  No  Homicidal Thoughts:  No  Memory:  Immediate;   Fair Recent;   Fair Remote;   Fair  Judgement:  Intact  Insight:  Fair  Psychomotor Activity:  Increased  Concentration:  Concentration: Fair and Attention Span: Fair  Recall:  [employer] of Knowledge:  Fair  Language:  Good  Akathisia:  Negative  Handed:  Right  AIMS (if indicated):     Assets:  Desire for Improvement Housing Resilience  ADL's:  Intact  Cognition:  WNL  Sleep:  Number of Hours: 5.25     Have you used any form of tobacco in the last 30 days? (Cigarettes, Smokeless Tobacco, Cigars, and/or Pipes): Yes  Has this patient used any form of tobacco in the last 30 days? (Cigarettes, Smokeless Tobacco, Cigars, and/or Pipes) Yes, No  Blood Alcohol level:  Lab Results  Component Value Date   [facility] <10 05/19/2020   ETH <11 02/09/2013    Metabolic  Disorder Labs:  Lab Results  Component Value Date   HGBA1C 5.4 05/21/2020   MPG 108.28 05/21/2020   No results found for: PROLACTIN Lab Results  Component Value Date   CHOL 148 05/21/2020   TRIG 80 05/21/2020   HDL 49 05/21/2020   CHOLHDL 3.0 05/21/2020   VLDL 16 05/21/2020   LDLCALC 83 05/21/2020   LDLCALC 67 02/23/2017    See Psychiatric Specialty Exam and Suicide Risk Assessment completed by Attending Physician prior to discharge.  Discharge destination:  Home  Is patient on multiple antipsychotic therapies at discharge:  No   Has Patient had three or more failed trials of antipsychotic monotherapy by history:  No  Recommended Plan for Multiple Antipsychotic Therapies: NA  Discharge Instructions    Diet - low sodium heart healthy   Complete by: As directed    Increase activity slowly   Complete by: As directed      Allergies as of 05/30/2020   No Known Allergies     Medication List    STOP taking these medications   busPIRone 15 MG tablet Commonly known as: BUSPAR   lithium 300 MG tablet Replaced by: lithium carbonate 300 MG CR tablet   pantoprazole 40 MG tablet Commonly known as: PROTONIX   potassium chloride SA 20 MEQ tablet Commonly known as: KLOR-CON   tiZANidine 4 MG tablet Commonly known as: ZANAFLEX     TAKE these medications     Indication  albuterol 108 (90 Base) MCG/ACT inhaler Commonly known as: VENTOLIN HFA Inhale 2 puffs into the lungs every 6 (six) hours as needed for wheezing or shortness of breath.  Indication: Chronic Obstructive Lung Disease   gabapentin 300 MG capsule Commonly known as: NEURONTIN Take 1 capsule (300 mg total) by mouth 2 (two) times daily. What changed:   how much to take  how to take this  when to take this  additional instructions  Indication: Abuse or Misuse of Alcohol   hydrOXYzine 25 MG tablet Commonly known as: ATARAX/VISTARIL Take 1 tablet (25 mg total) by mouth 3 (three) times daily as  needed for anxiety.  Indication: Feeling Anxious   ipratropium-albuterol 0.5-2.5 (3) MG/3ML Soln Commonly known as: DUONEB Take 3 mLs by nebulization every 4 (four) hours as needed.  Indication: Spasm  of Lung Air Passages   levothyroxine 75 MCG tablet Commonly known as: SYNTHROID Take 1 tablet (75 mcg total) by mouth daily. What changed:   medication strength  how much to take  Indication: Underactive Thyroid   lithium carbonate 300 MG CR tablet Commonly known as: LITHOBID Take 2 tablets (600 mg total) by mouth 2 (two) times daily. Replaces: lithium 300 MG tablet  Indication: Manic-Depression   OLANZapine 20 MG tablet Commonly known as: ZYPREXA Take 1 tablet (20 mg total) by mouth at bedtime.  Indication: Manic Phase of Manic-Depression   OLANZapine 10 MG tablet Commonly known as: ZYPREXA Take 1 tablet (10 mg total) by mouth daily. Start taking on: May 31, 2020  Indication: Hypomanic Episode of Bipolar Disorder, Manic Phase of Manic-Depression       Follow-up Information    Services, [facility]. Go on 06/05/2020.   Why: You have a hospital follow-up appointment on 06/05/20 at 9:00 am for therapy and medication management services.  This appointment will be held in person.  Please bring your photo ID, insurance card, and proof of any income. Contact information: [address] [place] [address] [phone]               Follow-up recommendations:  Activity:  ad lib Tests:  Be sure to get lithium level, TSH and metabolic panel done on 12/26. Other:  Follow-up with psychiatry as well as your primary care provider with regard to your medical issues including chronic pain  Comments: Take medications as directed.  Signed: Antonieta PertGreg Lawson Tonisha Silvey, MD 05/30/2020, 3:14 PM

## 2020-05-30 NOTE — NC FL2 (Signed)
[place] MEDICAID FL2 LEVEL OF CARE SCREENING TOOL     IDENTIFICATION  Patient Name: Craig [name] Birthdate: [birth year] Sex: male Admission Date (Current Location): 05/20/2020  [facility] [place] Number:  Aaron Edelman None Facility and Address:  [facility], [address] [place], [place] [address]      Provider Number: [id number]  Attending Physician Name and Address:  Antonieta Pert, MD  Relative Name and Phone Number:  Ervan Heber    Current Level of Care: Hospital Recommended Level of Care: Family Care Home,Other (Comment) (Group Home) Prior Approval Number:    Date Approved/Denied:   PASRR Number:    Discharge Plan: Other (Comment) ([facility]/Group Home)    Current Diagnoses: Patient Active Problem List   Diagnosis Date Noted  . High risk medication use 05/27/2020  . Nicotine abuse 05/27/2020  . Severe manic bipolar 1 disorder with psychotic behavior (HCC) 05/20/2020  . Chronic cholecystitis 02/28/2017  . Elevated liver enzymes   . Acute gallstone pancreatitis 02/22/2017  . COPD with acute exacerbation (HCC) 01/01/2016  . Bipolar 1 disorder (HCC) 01/01/2016  . Leukocytosis 01/01/2016    Orientation RESPIRATION BLADDER Height & Weight     Self,Time,Situation,Place  Normal Continent Weight: 82.1 kg Height:  5\' 6"  (167.6 cm)  BEHAVIORAL SYMPTOMS/MOOD NEUROLOGICAL BOWEL NUTRITION STATUS      Continent Diet (Normal)  AMBULATORY STATUS COMMUNICATION OF NEEDS Skin   Independent Verbally Normal                       Personal Care Assistance Level of Assistance  Bathing,Feeding,Dressing,Total care Bathing Assistance: Independent Feeding assistance: Independent Dressing Assistance: Independent Total Care Assistance: Independent   Functional Limitations Info  Sight,Hearing,Speech Sight Info: Adequate Hearing Info: Adequate Speech Info: Adequate    SPECIAL CARE FACTORS FREQUENCY                        Contractures Contractures Info: Not present    Additional Factors Info  Code Status,Allergies Code Status Info: FULL Allergies Info: No Known Allergies           Current Medications (05/30/2020):  This is the current hospital active medication list Current Facility-Administered Medications  Medication Dose Route Frequency Provider Last Rate Last Admin  . acetaminophen (TYLENOL) tablet 650 mg  650 mg Oral Q6H PRN 06/01/2020, NP   650 mg at 05/29/20 2037  . albuterol (VENTOLIN HFA) 108 (90 Base) MCG/ACT inhaler 1-2 puff  1-2 puff Inhalation Q6H PRN 2038, NP   2 puff at 05/29/20 0830  . alum & mag hydroxide-simeth (MAALOX/MYLANTA) 200-200-20 MG/5ML suspension 30 mL  30 mL Oral Q4H PRN 12-14-2000, NP      . gabapentin (NEURONTIN) capsule 300 mg  300 mg Oral BID Aldean Baker, NP   300 mg at 05/30/20 0804  . hydrOXYzine (ATARAX/VISTARIL) tablet 25 mg  25 mg Oral TID PRN 06/01/20, NP   25 mg at 05/29/20 2037  . lithium carbonate (LITHOBID) CR tablet 600 mg  600 mg Oral BID 2038 E, MD   600 mg at 05/30/20 0804  . ziprasidone (GEODON) injection 20 mg  20 mg Intramuscular Q12H PRN Nwoko, Uchenna E, PA   20 mg at 05/25/20 2250   And  . LORazepam (ATIVAN) tablet 1 mg  1 mg Oral PRN Nwoko, Uchenna E, PA      . magnesium hydroxide (MILK OF  MAGNESIA) suspension 30 mL  30 mL Oral Daily PRN Aldean Baker, NP      . methocarbamol (ROBAXIN) tablet 750 mg  750 mg Oral Q6H PRN Antonieta Pert, MD   750 mg at 05/30/20 0039  . nicotine (NICODERM CQ - dosed in mg/24 hours) patch 14 mg  14 mg Transdermal Daily Nira Conn A, NP   14 mg at 05/30/20 0804  . OLANZapine (ZYPREXA) tablet 10 mg  10 mg Oral Daily Eliseo Gum B, MD   10 mg at 05/30/20 0806  . OLANZapine (ZYPREXA) tablet 20 mg  20 mg Oral QHS Eliseo Gum B, MD   20 mg at 05/29/20 2036  . OLANZapine zydis (ZYPREXA) disintegrating tablet 5 mg  5 mg Oral Q8H PRN Aldean Baker, NP   5 mg at 05/27/20 1618  .  traMADol (ULTRAM) tablet 50 mg  50 mg Oral Q6H PRN Antonieta Pert, MD   50 mg at 05/30/20 0039  . traZODone (DESYREL) tablet 50 mg  50 mg Oral QHS PRN,MR X 1 Aldean Baker, NP   50 mg at 05/30/20 2993     Discharge Medications: Please see discharge summary for a list of discharge medications.  Relevant Imaging Results:  Relevant Lab Results:   Additional Information S.S. # [id number]  Otelia Santee, LCSW

## 2020-06-11 ENCOUNTER — Other Ambulatory Visit (HOSPITAL_COMMUNITY): Payer: Self-pay | Admitting: Psychiatry

## 2020-11-14 ENCOUNTER — Emergency Department (HOSPITAL_COMMUNITY): Payer: Medicaid Other

## 2020-11-14 ENCOUNTER — Emergency Department (HOSPITAL_COMMUNITY)
Admission: EM | Admit: 2020-11-14 | Discharge: 2020-11-14 | Disposition: A | Discharge status: Home / Self Care | Payer: Medicaid Other | Attending: Emergency Medicine | Admitting: Emergency Medicine

## 2020-11-14 ENCOUNTER — Encounter (HOSPITAL_COMMUNITY): Payer: Self-pay | Admitting: *Deleted

## 2020-11-14 DIAGNOSIS — M546 Pain in thoracic spine: Secondary | ICD-10-CM

## 2020-11-14 DIAGNOSIS — F17210 Nicotine dependence, cigarettes, uncomplicated: Secondary | ICD-10-CM | POA: Insufficient documentation

## 2020-11-14 DIAGNOSIS — Z79899 Other long term (current) drug therapy: Secondary | ICD-10-CM | POA: Insufficient documentation

## 2020-11-14 DIAGNOSIS — M954 Acquired deformity of chest and rib: Secondary | ICD-10-CM

## 2020-11-14 DIAGNOSIS — J441 Chronic obstructive pulmonary disease with (acute) exacerbation: Secondary | ICD-10-CM | POA: Insufficient documentation

## 2020-11-14 MED ORDER — OXYCODONE-ACETAMINOPHEN 5-325 MG PO TABS
1.0000 | ORAL_TABLET | Freq: Once | ORAL | Status: AC
Start: 2020-11-14 — End: 2020-11-14
  Administered 2020-11-14: 15:00:00 1 via ORAL
  Filled 2020-11-14: qty 1

## 2020-11-14 MED ORDER — OXYCODONE-ACETAMINOPHEN 5-325 MG PO TABS: 1.0000 | ORAL_TABLET | Freq: Four times a day (QID) | ORAL | 0 refills | Status: DC | PRN

## 2020-11-14 NOTE — ED Triage Notes (Signed)
States he has back pain all the time and he felt something pop in his back yesterday and pain is worse

## 2020-11-14 NOTE — ED Provider Notes (Signed)
Emergency Medicine Provider Triage Evaluation Note  Craig [name] , a 60 y.o. male  was evaluated in triage.  Pt complains of sudden onset, constant, sharp, mid back pain that began yesterday while twisting in his chair. Hx of multiple reconstructive back surgeries in the past. No radiation of pain. No falls. No radicular symptoms. No lower back pain/red flag symptoms.  Review of Systems  Positive: + back pain Negative: - weakness, numbness  Physical Exam  BP 122/70 (BP Location: Right Arm)   Pulse 69   Temp 97.9 F (36.6 C) (Oral)   Resp 18   SpO2 94%  Gen:   Awake, no distress   Resp:  Normal effort  MSK:   Moves extremities without difficulty  Other:  + midline thoracic TTP. No lumbar or cervical TTP. ROM intact. Strength 5/5 to BUE and BLEs.   Medical Decision Making  Medically screening exam initiated at 2:27 PM.  Appropriate orders placed.  Craig [name] was informed that the remainder of the evaluation will be completed by another provider, this initial triage assessment does not replace that evaluation, and the importance of remaining in the ED until their evaluation is complete.     Tanda Rockers, PA-C 11/14/20 1429    Mancel Bale, MD 11/15/20 [phone]

## 2020-11-14 NOTE — Discharge Instructions (Addendum)
Please pick up medication and take as needed for pain Follow up with your PCP regarding ED visit today and for further evaluation of your back pain. You may need a CT scan to further evaluate for the rib deformity seen on the xray.   You can also follow up with Dr. Romeo Apple orthopedist. Attached is information for [facility] as requested.   Return to the ED for any new/worsening symptoms

## 2020-11-14 NOTE — ED Provider Notes (Signed)
[facility] EMERGENCY DEPARTMENT Provider Note   CSN: [id number] Arrival date & time: 11/14/20  1318     History Chief Complaint  Patient presents with   Back Pain    Craig [name] is a 60 y.o. male with PMHx bipolar disorder, COPD, stroke who presents to the ED today with complaint of sudden onset, constant, sharp, mid back pain that began yesterday. Pt reports he twisted around in his chair causing a pop in his back and immediate pain. He has been taking his regular medications without relief. He reports multiple reconstructive back surgeries in the past however does not have a neurosurgeon here currently as he was in prison for several years and recently was released. Pt reports the pain is taking his breath away however denies SOB. No radiation of pain. No other complaints at this time.   The history is provided by the patient and medical records.      Past Medical History:  Diagnosis Date   Bipolar 1 disorder (HCC)    COPD (chronic obstructive pulmonary disease) (HCC)    PTSD (post-traumatic stress disorder)    Stroke (HCC)    left-sided weakness, around 2008    Patient Active Problem List   Diagnosis Date Noted   High risk medication use 05/27/2020   Nicotine abuse 05/27/2020   Severe manic bipolar 1 disorder with psychotic behavior (HCC) 05/20/2020   Chronic cholecystitis 02/28/2017   Elevated liver enzymes    Acute gallstone pancreatitis 02/22/2017   COPD with acute exacerbation (HCC) 01/01/2016   Bipolar 1 disorder (HCC) 01/01/2016   Leukocytosis 01/01/2016    Past Surgical History:  Procedure Laterality Date   APPENDECTOMY     BACK SURGERY     CHOLECYSTECTOMY N/A 02/28/2017   Procedure: LAPAROSCOPIC CHOLECYSTECTOMY, possible open;  Surgeon: Lucretia Roers, MD;  Location: [facility];  Service: General;  Laterality: N/A;       Family History  Problem Relation Age of Onset   Colon cancer Brother        diagnosed around age 7    Pancreatic cancer Neg Hx     Pancreatic disease Neg Hx     Social History   Tobacco Use   Smoking status: Every Day    Packs/day: 0.50    Pack years: 0.00    Types: Cigarettes   Smokeless tobacco: Never   Tobacco comments:    last smoke yesterday  Vaping Use   Vaping Use: Former  Substance Use Topics   Alcohol use: No    Alcohol/week: 1.0 standard drink    Types: 1 Glasses of wine per week    Comment: occ   Drug use: No    Home Medications Prior to Admission medications   Medication Sig Start Date End Date Taking? Authorizing Provider  oxyCODONE-acetaminophen (PERCOCET/ROXICET) 5-325 MG tablet Take 1 tablet by mouth every 6 (six) hours as needed for severe pain. 11/14/20  Yes Chatara Lucente, PA-C  albuterol (PROVENTIL HFA;VENTOLIN HFA) 108 (90 Base) MCG/ACT inhaler Inhale 2 puffs into the lungs every 6 (six) hours as needed for wheezing or shortness of breath. 01/06/16   Oval Linsey, MD  gabapentin (NEURONTIN) 300 MG capsule Take 1 capsule (300 mg total) by mouth 2 (two) times daily. 05/30/20   Antonieta Pert, MD  hydrOXYzine (ATARAX/VISTARIL) 25 MG tablet Take 1 tablet (25 mg total) by mouth 3 (three) times daily as needed for anxiety. 05/30/20   Antonieta Pert, MD  ipratropium-albuterol (DUONEB) 0.5-2.5 (3) MG/3ML SOLN  Take 3 mLs by nebulization every 4 (four) hours as needed. 01/06/16   Oval Linsey, MD  levothyroxine (SYNTHROID) 75 MCG tablet Take 1 tablet (75 mcg total) by mouth daily. 05/30/20   Antonieta Pert, MD  lithium carbonate (LITHOBID) 300 MG CR tablet Take 2 tablets (600 mg total) by mouth 2 (two) times daily. 05/30/20   Antonieta Pert, MD  OLANZapine (ZYPREXA) 10 MG tablet Take 1 tablet (10 mg total) by mouth daily. 05/31/20   Antonieta Pert, MD  OLANZapine (ZYPREXA) 20 MG tablet Take 1 tablet (20 mg total) by mouth at bedtime. 05/30/20   Antonieta Pert, MD    Allergies    Patient has no known allergies.  Review of Systems   Review of Systems   Constitutional:  Negative for chills and fever.  Respiratory:  Negative for shortness of breath.   Cardiovascular:  Negative for chest pain.  Musculoskeletal:  Positive for back pain.  All other systems reviewed and are negative.  Physical Exam Updated Vital Signs BP 95/65 (BP Location: Right Arm)   Pulse 71   Temp 97.9 F (36.6 C) (Oral)   Resp 16   SpO2 95%   Physical Exam Vitals and nursing note reviewed.  Constitutional:      Appearance: He is not ill-appearing.  HENT:     Head: Normocephalic and atraumatic.  Eyes:     Conjunctiva/sclera: Conjunctivae normal.  Cardiovascular:     Rate and Rhythm: Normal rate and regular rhythm.     Pulses: Normal pulses.  Pulmonary:     Effort: Pulmonary effort is normal.     Breath sounds: Normal breath sounds. No wheezing, rhonchi or rales.  Abdominal:     Palpations: Abdomen is soft.     Tenderness: There is no abdominal tenderness. There is no guarding or rebound.  Musculoskeletal:     Cervical back: Neck supple.     Comments: No C midline spinal TTP.  + lower thoracic midline spinal TTP. No lumbar midline TTP. ROM intact to neck and back. Strength 5/5 to BUE and BLEs. Sensation intact throughout. 2+ distal pulses bilaterally.   Skin:    General: Skin is warm and dry.  Neurological:     Mental Status: He is alert.    ED Results / Procedures / Treatments   Labs (all labs ordered are listed, but only abnormal results are displayed) Labs Reviewed - No data to display  EKG None  Radiology DG Thoracic Spine W/Swimmers  Result Date: 11/14/2020 CLINICAL DATA:  Back pain EXAM: THORACIC SPINE - 3 VIEWS COMPARISON:  CT from 01/01/2016, 02/22/2017 FINDINGS: Postsurgical changes are again identified it the thoracolumbar junction with pedicle screws at T12 and posterior fixation extending inferiorly but incompletely evaluated on this exam. L1 compression deformity is noted which appears stable in appearance from prior CT in 2018.  Compression at T9 is noted and stable from the prior CT. Compression deformities at T11 and T10 are noted which have progressed in the interval from the prior exam. No paraspinal mass lesion is noted. No new compression deformity is noted. Rib deformity is noted on the lateral film only which appears to be related to tenth rib although the side is not determined based on this exam. IMPRESSION: Multilevel compression deformities. The overall appearance is stable with the exception of some increased compression deformity at T11 when compared with the prior exam from 2018 but likely of a chronic nature given the long-standing T10 compression deformity. Rib deformity  is noted best seen on the lateral projection but appears well corticated and likely chronic in nature. Is not well appreciated on the frontal film. The need for further evaluation can be determined on a clinical basis. This would be best served with CT given the relative lack of findings on the frontal film. Electronically Signed   By: Alcide CleverMark  Lukens M.D.   On: 11/14/2020 15:27    Procedures Procedures   Medications Ordered in ED Medications  oxyCODONE-acetaminophen (PERCOCET/ROXICET) 5-325 MG per tablet 1 tablet (1 tablet Oral Given 11/14/20 1529)    ED Course  I have reviewed the triage vital signs and the nursing notes.  Pertinent labs & imaging results that were available during my care of the patient were reviewed by me and considered in my medical decision making (see chart for details).    MDM Rules/Calculators/A&P                          60 year old male with history of multiple back surgeries presenting to the ED today with sudden onset mid back pain after twisting in his chair yesterday.  Felt a popping sensation.  On arrival to the ED today patient is afebrile, nontachycardic and nontachypneic and appears to be in no acute distress however is uncomfortable on exam.  On my exam he has lower thoracic midline spinal tenderness  palpation, no step-offs or deformities however given midline pain will plan for x-ray at this time.  May consider CT scan if any abnormalities.  Provide pain medication for same.  Xray: IMPRESSION:  Multilevel compression deformities. The overall appearance is stable  with the exception of some increased compression deformity at T11  when compared with the prior exam from 2018 but likely of a chronic  nature given the long-standing T10 compression deformity.     Rib deformity is noted best seen on the lateral projection but  appears well corticated and likely chronic in nature. Is not well  appreciated on the frontal film. The need for further evaluation can  be determined on a clinical basis. This would be best served with CT  given the relative lack of findings on the frontal film.   Discussed case with attending physician Dr. Audley HoseHong; recommends outpatient follow up with PCP. Does not require emergent CT scan in the ED. Have updated patient on plan who is in agreement. Will also provide information for orthopedics given concern for possible rib fracture. Pt also requesting info for neurosurgeon in the area given hx of multiple back surgeries; will provide. Stable for discharge at this time.   This note was prepared using Dragon voice recognition software and may include unintentional dictation errors due to the inherent limitations of voice recognition software.  Final Clinical Impression(s) / ED Diagnoses Final diagnoses:  Acute midline thoracic back pain  Rib deformity    Rx / DC Orders ED Discharge Orders          Ordered    oxyCODONE-acetaminophen (PERCOCET/ROXICET) 5-325 MG tablet  Every 6 hours PRN        11/14/20 1633             Discharge Instructions      Please pick up medication and take as needed for pain Follow up with your PCP regarding ED visit today and for further evaluation of your back pain. You may need a CT scan to further evaluate for the rib deformity  seen on the xray.   You  can also follow up with Dr. Romeo Apple orthopedist. Attached is information for [facility] as requested.   Return to the ED for any new/worsening symptoms       Tanda Rockers, Cordelia Poche 11/14/20 1635    Cheryll Cockayne, MD 11/21/20 [phone]

## 2021-01-15 DIAGNOSIS — Z79899 Other long term (current) drug therapy: Secondary | ICD-10-CM | POA: Diagnosis not present

## 2021-01-15 DIAGNOSIS — E785 Hyperlipidemia, unspecified: Secondary | ICD-10-CM | POA: Diagnosis not present

## 2021-01-15 DIAGNOSIS — M109 Gout, unspecified: Secondary | ICD-10-CM | POA: Diagnosis not present

## 2021-01-15 DIAGNOSIS — E039 Hypothyroidism, unspecified: Secondary | ICD-10-CM | POA: Diagnosis not present

## 2021-01-15 DIAGNOSIS — J449 Chronic obstructive pulmonary disease, unspecified: Secondary | ICD-10-CM | POA: Diagnosis not present

## 2021-02-02 DIAGNOSIS — H5202 Hypermetropia, left eye: Secondary | ICD-10-CM | POA: Diagnosis not present

## 2021-02-02 DIAGNOSIS — H2589 Other age-related cataract: Secondary | ICD-10-CM | POA: Diagnosis not present

## 2021-02-02 DIAGNOSIS — H2512 Age-related nuclear cataract, left eye: Secondary | ICD-10-CM | POA: Diagnosis not present

## 2021-02-02 DIAGNOSIS — H52222 Regular astigmatism, left eye: Secondary | ICD-10-CM | POA: Diagnosis not present

## 2021-04-13 ENCOUNTER — Encounter (HOSPITAL_COMMUNITY): Payer: Self-pay

## 2021-04-13 ENCOUNTER — Emergency Department (HOSPITAL_COMMUNITY)
Admission: EM | Admit: 2021-04-13 | Discharge: 2021-04-13 | Disposition: A | Discharge status: Home / Self Care | Payer: Medicare Other | Attending: Emergency Medicine | Admitting: Emergency Medicine

## 2021-04-13 ENCOUNTER — Other Ambulatory Visit: Payer: Self-pay

## 2021-04-13 DIAGNOSIS — M545 Low back pain, unspecified: Secondary | ICD-10-CM | POA: Diagnosis not present

## 2021-04-13 DIAGNOSIS — Z79899 Other long term (current) drug therapy: Secondary | ICD-10-CM | POA: Diagnosis not present

## 2021-04-13 DIAGNOSIS — M546 Pain in thoracic spine: Secondary | ICD-10-CM | POA: Diagnosis not present

## 2021-04-13 DIAGNOSIS — G8929 Other chronic pain: Secondary | ICD-10-CM | POA: Diagnosis not present

## 2021-04-13 DIAGNOSIS — J449 Chronic obstructive pulmonary disease, unspecified: Secondary | ICD-10-CM | POA: Diagnosis not present

## 2021-04-13 DIAGNOSIS — M5459 Other low back pain: Secondary | ICD-10-CM | POA: Diagnosis not present

## 2021-04-13 DIAGNOSIS — F17210 Nicotine dependence, cigarettes, uncomplicated: Secondary | ICD-10-CM | POA: Insufficient documentation

## 2021-04-13 MED ORDER — METHOCARBAMOL 500 MG PO TABS: 500.0000 mg | ORAL_TABLET | Freq: Three times a day (TID) | ORAL | 0 refills | Status: DC

## 2021-04-13 MED ORDER — OXYCODONE-ACETAMINOPHEN 5-325 MG PO TABS
1.0000 | ORAL_TABLET | Freq: Once | ORAL | Status: AC
  Administered 2021-04-13: 1 via ORAL
  Filled 2021-04-13: qty 1

## 2021-04-13 MED ORDER — OXYCODONE-ACETAMINOPHEN 5-325 MG PO TABS: 1.0000 | ORAL_TABLET | Freq: Four times a day (QID) | ORAL | 0 refills | Status: DC | PRN

## 2021-04-13 NOTE — ED Notes (Signed)
PA at bedside.

## 2021-04-13 NOTE — ED Provider Notes (Signed)
[facility] EMERGENCY DEPARTMENT Provider Note   CSN: [id number] Arrival date & time: 04/13/21  1441     History Chief Complaint  Patient presents with   Back Pain    WISSAM RESOR is a 60 y.o. male.   Back Pain Associated symptoms: no abdominal pain, no chest pain, no dysuria, no fever, no numbness and no weakness        CRISTHIAN VANHOOK is a 60 y.o. male who presents to the Emergency Department complaining of acute on chronic low back pain.  He describes aching pain from his mid to lower back that worsens with certain movements.  Improves somewhat at rest.  States he is unable to walk erect due to increased pain of his back.  He typically takes oxycodone for his pain but has ran out of medication as he was incarcerated for some time.  He has an upcoming appointment with his neurologist next week.  He denies any recent injury or fall.  He also denies fever, chills, abdominal pain, urine or bowel changes, pain numbness or weakness of his lower extremities.  He is requesting pain control until his neurology appointment next week. he walks with a cane at baseline.   Past Medical History:  Diagnosis Date   Bipolar 1 disorder (HCC)    COPD (chronic obstructive pulmonary disease) (HCC)    PTSD (post-traumatic stress disorder)    Stroke (HCC)    left-sided weakness, around 2008    Patient Active Problem List   Diagnosis Date Noted   High risk medication use 05/27/2020   Nicotine abuse 05/27/2020   Severe manic bipolar 1 disorder with psychotic behavior (HCC) 05/20/2020   Chronic cholecystitis 02/28/2017   Elevated liver enzymes    Acute gallstone pancreatitis 02/22/2017   COPD with acute exacerbation (HCC) 01/01/2016   Bipolar 1 disorder (HCC) 01/01/2016   Leukocytosis 01/01/2016    Past Surgical History:  Procedure Laterality Date   APPENDECTOMY     BACK SURGERY     CHOLECYSTECTOMY N/A 02/28/2017   Procedure: LAPAROSCOPIC CHOLECYSTECTOMY, possible open;  Surgeon:  Lucretia Roers, MD;  Location: [facility];  Service: General;  Laterality: N/A;       Family History  Problem Relation Age of Onset   Colon cancer Brother        diagnosed around age 57    Pancreatic cancer Neg Hx    Pancreatic disease Neg Hx     Social History   Tobacco Use   Smoking status: Every Day    Packs/day: 0.50    Types: Cigarettes   Smokeless tobacco: Never   Tobacco comments:    last smoke yesterday  Vaping Use   Vaping Use: Former  Substance Use Topics   Alcohol use: No    Alcohol/week: 1.0 standard drink    Types: 1 Glasses of wine per week    Comment: occ   Drug use: No    Home Medications Prior to Admission medications   Medication Sig Start Date End Date Taking? Authorizing Provider  albuterol (PROVENTIL HFA;VENTOLIN HFA) 108 (90 Base) MCG/ACT inhaler Inhale 2 puffs into the lungs every 6 (six) hours as needed for wheezing or shortness of breath. 01/06/16   Oval Linsey, MD  gabapentin (NEURONTIN) 300 MG capsule Take 1 capsule (300 mg total) by mouth 2 (two) times daily. 05/30/20   Antonieta Pert, MD  hydrOXYzine (ATARAX/VISTARIL) 25 MG tablet Take 1 tablet (25 mg total) by mouth 3 (three) times daily as needed  for anxiety. 05/30/20   Sharma Covert, MD  ipratropium-albuterol (DUONEB) 0.5-2.5 (3) MG/3ML SOLN Take 3 mLs by nebulization every 4 (four) hours as needed. 01/06/16   Lucia Gaskins, MD  levothyroxine (SYNTHROID) 75 MCG tablet Take 1 tablet (75 mcg total) by mouth daily. 05/30/20   Sharma Covert, MD  lithium carbonate (LITHOBID) 300 MG CR tablet Take 2 tablets (600 mg total) by mouth 2 (two) times daily. 05/30/20   Sharma Covert, MD  OLANZapine (ZYPREXA) 10 MG tablet Take 1 tablet (10 mg total) by mouth daily. 05/31/20   Sharma Covert, MD  OLANZapine (ZYPREXA) 20 MG tablet Take 1 tablet (20 mg total) by mouth at bedtime. 05/30/20   Sharma Covert, MD  oxyCODONE-acetaminophen (PERCOCET/ROXICET) 5-325 MG tablet Take 1  tablet by mouth every 6 (six) hours as needed for severe pain. 11/14/20   Eustaquio Maize, PA-C    Allergies    Patient has no known allergies.  Review of Systems   Review of Systems  Constitutional:  Negative for fever.  Respiratory:  Negative for shortness of breath.   Cardiovascular:  Negative for chest pain.  Gastrointestinal:  Negative for abdominal pain, constipation and vomiting.  Genitourinary:  Negative for decreased urine volume, difficulty urinating, dysuria, flank pain and hematuria.  Musculoskeletal:  Positive for back pain. Negative for joint swelling.  Skin:  Negative for rash.  Neurological:  Negative for weakness and numbness.   Physical Exam Updated Vital Signs BP 127/79 (BP Location: Right Arm)   Pulse 80   Temp 98.3 F (36.8 C) (Oral)   Resp 18   Ht 5\' 6"  (1.676 m)   Wt 83.9 kg   SpO2 95%   BMI 29.86 kg/m   Physical Exam Vitals and nursing note reviewed.  Constitutional:      Appearance: Normal appearance. He is not ill-appearing or toxic-appearing.  Cardiovascular:     Rate and Rhythm: Normal rate and regular rhythm.     Pulses: Normal pulses.  Pulmonary:     Effort: Pulmonary effort is normal.     Breath sounds: Normal breath sounds.  Abdominal:     Palpations: Abdomen is soft.     Tenderness: There is no abdominal tenderness.  Musculoskeletal:        General: Tenderness present. Normal range of motion.     Right lower leg: No edema.     Left lower leg: No edema.     Comments: Diffuse tenderness of the lower thoracic and lumbar spine and bilateral paraspinal muscles.  Large midline surgical scar that appears well-healed.  No edema or erythema.  Skin:    General: Skin is warm.     Capillary Refill: Capillary refill takes less than 2 seconds.     Findings: No erythema or rash.  Neurological:     General: No focal deficit present.     Mental Status: He is alert.     Sensory: No sensory deficit.     Motor: No weakness.    ED Results /  Procedures / Treatments   Labs (all labs ordered are listed, but only abnormal results are displayed) Labs Reviewed - No data to display  EKG None  Radiology No results found.  Procedures Procedures   Medications Ordered in ED Medications - No data to display  ED Course  I have reviewed the triage vital signs and the nursing notes.  Pertinent labs & imaging results that were available during my care of the patient were reviewed  by me and considered in my medical decision making (see chart for details).    MDM Rules/Calculators/A&P                           Patient here with a likely acute on chronic low back pain.  Has not had any medications for pain control in some time as he was recently incarcerated.  Has upcoming appointment next week with neurologist and PCP.  No new fall or reported injury.  He is ambulatory with a cane at baseline.  On exam, patient well-appearing.  Nontoxic-appearing.  He is ambulatory with a slow but steady gait.  No red flags to suggest cauda equina, no saddle anesthesias.  I feel that he is appropriate for discharge home.  No indication for emergent imaging at this time.  Patient will be given short course of pain medication with the understanding that he will follow-up with his neurologist next week as previously scheduled.  Database reviewed.  No recent narcotic prescriptions on file.   Final Clinical Impression(s) / ED Diagnoses Final diagnoses:  Acute midline low back pain without sciatica    Rx / DC Orders ED Discharge Orders     None        Bufford Lope 04/13/21 1843    Noemi Chapel, MD 04/16/21 1249

## 2021-04-13 NOTE — ED Notes (Signed)
Pt reports people from his group home will be coming to pick him up to drive him home

## 2021-04-13 NOTE — Discharge Instructions (Signed)
Take the medication as directed.  Avoid driving or operating machinery while taking the medication as it would likely cause drowsiness.  Sure to keep your appointment with your neurologist and primary care provider next week as previously scheduled.  Return to the emergency department for any new or worsening symptoms.

## 2021-04-13 NOTE — ED Notes (Signed)
Pt ambulatory with steady gait assisted with his cane. Pt verbalized understanding of d/c instructions, prescriptions and follow up care.

## 2021-04-13 NOTE — ED Triage Notes (Signed)
Patient complaining of chronic back pain. States that he has had pain for several months but the last two days are worse. States that he can hardly stand straight.

## 2021-04-22 DIAGNOSIS — G8921 Chronic pain due to trauma: Secondary | ICD-10-CM | POA: Diagnosis not present

## 2021-04-22 DIAGNOSIS — Z79891 Long term (current) use of opiate analgesic: Secondary | ICD-10-CM | POA: Diagnosis not present

## 2021-04-22 DIAGNOSIS — G894 Chronic pain syndrome: Secondary | ICD-10-CM | POA: Diagnosis not present

## 2021-04-22 DIAGNOSIS — G8928 Other chronic postprocedural pain: Secondary | ICD-10-CM | POA: Diagnosis not present

## 2021-04-22 DIAGNOSIS — M546 Pain in thoracic spine: Secondary | ICD-10-CM | POA: Diagnosis not present

## 2021-05-06 DIAGNOSIS — H01005 Unspecified blepharitis left lower eyelid: Secondary | ICD-10-CM | POA: Diagnosis not present

## 2021-05-06 DIAGNOSIS — H01004 Unspecified blepharitis left upper eyelid: Secondary | ICD-10-CM | POA: Diagnosis not present

## 2021-05-06 DIAGNOSIS — H01001 Unspecified blepharitis right upper eyelid: Secondary | ICD-10-CM | POA: Diagnosis not present

## 2021-05-06 DIAGNOSIS — H2521 Age-related cataract, morgagnian type, right eye: Secondary | ICD-10-CM | POA: Diagnosis not present

## 2021-05-06 DIAGNOSIS — H01002 Unspecified blepharitis right lower eyelid: Secondary | ICD-10-CM | POA: Diagnosis not present

## 2021-06-04 DIAGNOSIS — H2521 Age-related cataract, morgagnian type, right eye: Secondary | ICD-10-CM | POA: Diagnosis not present

## 2021-06-09 ENCOUNTER — Encounter (HOSPITAL_COMMUNITY)
Admission: RE | Admit: 2021-06-09 | Discharge: 2021-06-09 | Disposition: A | Discharge status: Home / Self Care | Payer: Medicare Other | Source: Ambulatory Visit | Attending: Ophthalmology | Admitting: Ophthalmology

## 2021-06-11 NOTE — H&P (Signed)
Surgical History & Physical  Patient Name: Craig [name] DOB: 1961-01-11  Surgery: Cataract extraction with intraocular lens implant phacoemulsification; Right Eye  Surgeon: Fabio Pierce MD Surgery Date:  06-12-21 Pre-Op Date:  06-04-21  HPI: A 62 Yr. old male patient Pt referred by Dr. Daphine Deutscher for cataract evaluation. The patient complains of difficulty when viewing TV, reading closed caption, news scrolls on TV, which began 6 years ago. Both eyes are affected. The episode is gradual. The condition's severity is worsening. Pt states he can no longer drive due to vision. Pt states he has lost most to all of his vison OD. Symptoms are negatively affecting pt's quality of life. Pt currently use AT's prn OU. Pt denies any eye pain or increase in floaters/flashes of light. HPI was performed by Fabio Pierce .  Medical History: Cataracts Macula Degeneration Glaucoma Arthritis Lung Problems Thyroid Problems  Review of Systems Negative Allergic/Immunologic Negative Cardiovascular Negative Constitutional Negative Ear, Nose, Mouth & Throat Negative Endocrine Negative Eyes Negative Gastrointestinal Negative Genitourinary Negative Hemotologic/Lymphatic Negative Integumentary Negative Musculoskeletal Negative Neurological Negative Psychiatry Negative Respiratory  Social   Current every day smoker   Medication Ellipta, Proair respiClick, Spiriva, Oxycodone/acetaminophen, Gabapentin, Hydroxyzine, Lithium, Mirtazapine, Olanzapine,   Sx/Procedures Hernia Sx,   Drug Allergies   NKDA  History & Physical: Heent: Cataract, Right Eye NECK: supple without bruits LUNGS: lungs clear to auscultation CV: regular rate and rhythm Abdomen: soft and non-tender  Impression & Plan: Assessment: 1.  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  BLEPHARITIS; Right Upper Lid, Right Lower Lid, Left Upper Lid, Left Lower Lid (H01.001, H01.002,H01.004,H01.005) 3.  NUCLEAR  SCLEROSIS AGE RELATED; Left Eye (H25.12)  Plan: 1.  Cataract accounts for the patient's decreased vision. This visual impairment is not correctable with a tolerable change in glasses or contact lenses. Cataract surgery with an implantation of a new lens should significantly improve the visual and functional status of the patient. Discussed all risks, benefits, alternatives, and potential complications. Discussed the procedures and recovery. Patient desires to have surgery. A-scan ordered and performed today for intra-ocular lens calculations. The surgery will be performed in order to improve vision for driving, reading, and for eye examinations. Recommend phacoemulsification with intra-ocular lens. Recommend Dextenza for post-operative pain and inflammation. Right Eye. Dilates poorly - shugacaine by protocol. [employer]. Omidira.  2.  Recommend regular lid cleaning.  3.  Cataract not visually significant - continue to monitor.

## 2021-06-12 ENCOUNTER — Ambulatory Visit (HOSPITAL_COMMUNITY)
Admission: RE | Admit: 2021-06-12 | Discharge: 2021-06-12 | Disposition: A | Discharge status: Home / Self Care | Payer: Medicare Other | Attending: Ophthalmology | Admitting: Ophthalmology

## 2021-06-12 ENCOUNTER — Ambulatory Visit (HOSPITAL_COMMUNITY): Payer: Medicare Other | Admitting: Certified Registered"

## 2021-06-12 ENCOUNTER — Encounter (HOSPITAL_COMMUNITY): Payer: Self-pay | Admitting: Ophthalmology

## 2021-06-12 ENCOUNTER — Encounter (HOSPITAL_COMMUNITY)
Admission: RE | Disposition: A | Discharge status: Home / Self Care | Payer: Self-pay | Source: Home / Self Care | Attending: Ophthalmology

## 2021-06-12 DIAGNOSIS — F319 Bipolar disorder, unspecified: Secondary | ICD-10-CM | POA: Diagnosis not present

## 2021-06-12 DIAGNOSIS — I6990 Unspecified sequelae of unspecified cerebrovascular disease: Secondary | ICD-10-CM | POA: Insufficient documentation

## 2021-06-12 DIAGNOSIS — J441 Chronic obstructive pulmonary disease with (acute) exacerbation: Secondary | ICD-10-CM | POA: Diagnosis not present

## 2021-06-12 DIAGNOSIS — H2521 Age-related cataract, morgagnian type, right eye: Secondary | ICD-10-CM | POA: Diagnosis not present

## 2021-06-12 DIAGNOSIS — H0100B Unspecified blepharitis left eye, upper and lower eyelids: Secondary | ICD-10-CM | POA: Insufficient documentation

## 2021-06-12 DIAGNOSIS — F17200 Nicotine dependence, unspecified, uncomplicated: Secondary | ICD-10-CM | POA: Insufficient documentation

## 2021-06-12 DIAGNOSIS — H0100A Unspecified blepharitis right eye, upper and lower eyelids: Secondary | ICD-10-CM | POA: Diagnosis not present

## 2021-06-12 DIAGNOSIS — H2512 Age-related nuclear cataract, left eye: Secondary | ICD-10-CM | POA: Insufficient documentation

## 2021-06-12 DIAGNOSIS — F419 Anxiety disorder, unspecified: Secondary | ICD-10-CM | POA: Diagnosis not present

## 2021-06-12 HISTORY — PX: CATARACT EXTRACTION W/PHACO: SHX586

## 2021-06-12 SURGERY — PHACOEMULSIFICATION, CATARACT, WITH IOL INSERTION
Anesthesia: Monitor Anesthesia Care | Site: Eye | Laterality: Right

## 2021-06-12 MED ORDER — NEOMYCIN-POLYMYXIN-DEXAMETH 3.5-10000-0.1 OP SUSP
OPHTHALMIC | Status: DC | PRN
  Administered 2021-06-12: 1 [drp] via OPHTHALMIC

## 2021-06-12 MED ORDER — MIDAZOLAM HCL 2 MG/2ML IJ SOLN
INTRAMUSCULAR | Status: DC | PRN
  Administered 2021-06-12: 1 mg via INTRAVENOUS

## 2021-06-12 MED ORDER — BSS IO SOLN
INTRAOCULAR | Status: DC | PRN
  Administered 2021-06-12: 15 mL via INTRAOCULAR

## 2021-06-12 MED ORDER — PHENYLEPHRINE-KETOROLAC 1-0.3 % IO SOLN
INTRAOCULAR | Status: DC | PRN
  Administered 2021-06-12: 500 mL via OPHTHALMIC

## 2021-06-12 MED ORDER — STERILE WATER FOR IRRIGATION IR SOLN
Status: DC | PRN
  Administered 2021-06-12: 250 mL

## 2021-06-12 MED ORDER — SODIUM HYALURONATE 23MG/ML IO SOSY
PREFILLED_SYRINGE | INTRAOCULAR | Status: DC | PRN
  Administered 2021-06-12: 0.6 mL via INTRAOCULAR

## 2021-06-12 MED ORDER — LIDOCAINE HCL 3.5 % OP GEL: 1.0000 "application " | Freq: Once | OPHTHALMIC | Status: DC

## 2021-06-12 MED ORDER — POVIDONE-IODINE 5 % OP SOLN
OPHTHALMIC | Status: DC | PRN
  Administered 2021-06-12: 1 via OPHTHALMIC

## 2021-06-12 MED ORDER — TRYPAN BLUE 0.06 % IO SOSY
PREFILLED_SYRINGE | INTRAOCULAR | Status: AC
  Filled 2021-06-12: qty 0.5

## 2021-06-12 MED ORDER — SODIUM CHLORIDE (PF) 0.9 % IJ SOLN
INTRAMUSCULAR | Status: AC
  Filled 2021-06-12: qty 10

## 2021-06-12 MED ORDER — TRYPAN BLUE 0.06 % IO SOSY
PREFILLED_SYRINGE | INTRAOCULAR | Status: DC | PRN
Start: 2021-06-12 — End: 2021-06-12
  Administered 2021-06-12: 0.5 mL via INTRAOCULAR

## 2021-06-12 MED ORDER — MIDAZOLAM HCL 2 MG/2ML IJ SOLN
INTRAMUSCULAR | Status: AC
  Filled 2021-06-12: qty 2

## 2021-06-12 MED ORDER — PHENYLEPHRINE-KETOROLAC 1-0.3 % IO SOLN
INTRAOCULAR | Status: AC
  Filled 2021-06-12: qty 4

## 2021-06-12 MED ORDER — SODIUM CHLORIDE 0.9% FLUSH
INTRAVENOUS | Status: DC | PRN
Start: 2021-06-12 — End: 2021-06-12
  Administered 2021-06-12: 5 mL via INTRAVENOUS

## 2021-06-12 MED ORDER — EPINEPHRINE PF 1 MG/ML IJ SOLN
INTRAMUSCULAR | Status: AC
  Filled 2021-06-12: qty 1

## 2021-06-12 MED ORDER — SODIUM HYALURONATE 10 MG/ML IO SOLUTION
PREFILLED_SYRINGE | INTRAOCULAR | Status: DC | PRN
  Administered 2021-06-12: 0.85 mL via INTRAOCULAR

## 2021-06-12 MED ORDER — PHENYLEPHRINE HCL 2.5 % OP SOLN
1.0000 [drp] | OPHTHALMIC | Status: AC | PRN
  Administered 2021-06-12 (×3): 1 [drp] via OPHTHALMIC

## 2021-06-12 MED ORDER — TROPICAMIDE 1 % OP SOLN
1.0000 [drp] | OPHTHALMIC | Status: AC | PRN
  Administered 2021-06-12 (×3): 1 [drp] via OPHTHALMIC
  Filled 2021-06-12: qty 2

## 2021-06-12 MED ORDER — LIDOCAINE HCL (PF) 1 % IJ SOLN
INTRAOCULAR | Status: DC | PRN
  Administered 2021-06-12: 1 mL

## 2021-06-12 MED ORDER — TETRACAINE HCL 0.5 % OP SOLN
1.0000 [drp] | OPHTHALMIC | Status: AC | PRN
  Administered 2021-06-12 (×3): 1 [drp] via OPHTHALMIC

## 2021-06-12 SURGICAL SUPPLY — 14 items
CATARACT SUITE SIGHTPATH (MISCELLANEOUS) ×2 IMPLANT
CLOTH BEACON ORANGE TIMEOUT ST (SAFETY) ×1 IMPLANT
EYE SHIELD UNIVERSAL CLEAR (GAUZE/BANDAGES/DRESSINGS) ×1 IMPLANT
FEE CATARACT SUITE SIGHTPATH (MISCELLANEOUS) IMPLANT
GLOVE SURG UNDER POLY LF SZ7 (GLOVE) ×2 IMPLANT
LENS IOL RAYNER 21.5 (Intraocular Lens) ×2 IMPLANT
LENS IOL RAYONE EMV 21.5 (Intraocular Lens) IMPLANT
NDL HYPO 18GX1.5 BLUNT FILL (NEEDLE) IMPLANT
NEEDLE HYPO 18GX1.5 BLUNT FILL (NEEDLE) ×2 IMPLANT
PAD ARMBOARD 7.5X6 YLW CONV (MISCELLANEOUS) ×1 IMPLANT
SYR TB 1ML LL NO SAFETY (SYRINGE) ×1 IMPLANT
TAPE SURG TRANSPORE 1 IN (GAUZE/BANDAGES/DRESSINGS) IMPLANT
TAPE SURGICAL TRANSPORE 1 IN (GAUZE/BANDAGES/DRESSINGS) ×2
WATER STERILE IRR 250ML POUR (IV SOLUTION) ×1 IMPLANT

## 2021-06-12 NOTE — Op Note (Signed)
Date of procedure: 06/12/21  Pre-operative diagnosis: Mature Visually significant age-related cataract, Right Eye (H25.21)  Post-operative diagnosis: Mature Visually significant age-related cataract, Right Eye  Procedure: Complex Removal of cataract via phacoemulsification and insertion of intra-ocular lens Rayner [id number] +21.5D into the capsular bag of the Right Eye  Attending surgeon: Gerda Diss. Lynleigh Kovack, MD, MA  Anesthesia: MAC, Topical Akten  Complications: None  Estimated Blood Loss: <62m (minimal)  Specimens: None  Implants: As above  Indications:  Mature Visually significant age-related cataract, Right Eye  Procedure:  The patient was seen and identified in the pre-operative area. The operative eye was identified and dilated.  The operative eye was marked.  Topical anesthesia was administered to the operative eye.     The patient was then to the operative suite and placed in the supine position.  A timeout was performed confirming the patient, procedure to be performed, and all other relevant information.   The patient's face was prepped and draped in the usual fashion for intra-ocular surgery.  A lid speculum was placed into the operative eye and the surgical microscope moved into place and focused.  A lack of red reflex due to a mature cataract was confirmed.  A superotemporal paracentesis was created using a 20 gauge paracentesis blade.  Vision blue was injected into the anterior chamber.  Shugarcaine was injected into the anterior chamber.  Viscoelastic was injected into the anterior chamber.  A temporal clear-corneal main wound incision was created using a 2.456mmicrokeratome.  A continuous curvilinear capsulorrhexis was initiated using an irrigating cystitome and completed using capsulorrhexis forceps.  Hydrodissection and hydrodeliniation were performed.  Viscoelastic was injected into the anterior chamber.  A phacoemulsification handpiece and a chopper as a second instrument were  used to remove the nucleus and epinucleus. The irrigation/aspiration handpiece was used to remove any remaining cortical material.   The capsular bag was reinflated with viscoelastic, checked, and found to be intact. The intraocular lens was inserted into the capsular bag and dialed into place using a kuglen hook.  The irrigation/aspiration handpiece was used to remove any remaining viscoelastic.  The clear corneal wound and paracentesis wounds were then hydrated and checked with Weck-Cels to be watertight.  The lid-speculum and drape was removed, and the patient's face was cleaned with a wet and dry 4x4.  Maxitrol was instilled in the eye before a clear shield was taped over the eye. The patient was taken to the post-operative care unit in good condition, having tolerated the procedure well.  Post-Op Instructions: [facility] and will receive all other orders and instructions.

## 2021-06-12 NOTE — Transfer of Care (Signed)
Immediate Anesthesia Transfer of Care Note  Patient: Craig [name]  Procedure(s) Performed: CATARACT EXTRACTION PHACO AND INTRAOCULAR LENS PLACEMENT (IOC) (Right: Eye)  Patient Location: Short Stay  Anesthesia Type:MAC  Level of Consciousness: awake, alert  and oriented  Airway & Oxygen Therapy: Patient Spontanous Breathing  Post-op Assessment: Report given to RN, Post -op Vital signs reviewed and stable and Patient moving all extremities X 4  Post vital signs: Reviewed and stable  Last Vitals:  Vitals Value Taken Time  BP    Temp    Pulse    Resp    SpO2      Last Pain:  Vitals:   06/12/21 0701  TempSrc: Oral  PainSc: 7       Patients Stated Pain Goal: 7 (32/99/24 2683)  Complications: No notable events documented.

## 2021-06-12 NOTE — Anesthesia Preprocedure Evaluation (Signed)
Anesthesia Evaluation  Patient identified by MRN, date of birth, ID band Patient awake    Reviewed: Allergy & Precautions, H&P , NPO status , Patient's Chart, lab work & pertinent test results, reviewed documented beta blocker date and time   Airway Mallampati: II  TM Distance: >3 FB Neck ROM: full    Dental no notable dental hx.    Pulmonary neg pulmonary ROS, Current Smoker,    Pulmonary exam normal breath sounds clear to auscultation       Cardiovascular Exercise Tolerance: Good negative cardio ROS   Rhythm:regular Rate:Normal     Neuro/Psych PSYCHIATRIC DISORDERS Anxiety Bipolar Disorder CVA, Residual Symptoms    GI/Hepatic negative GI ROS, Neg liver ROS,   Endo/Other  negative endocrine ROS  Renal/GU negative Renal ROS  negative genitourinary   Musculoskeletal   Abdominal   Peds  Hematology negative hematology ROS (+)   Anesthesia Other Findings   Reproductive/Obstetrics negative OB ROS                             Anesthesia Physical Anesthesia Plan  ASA: 3  Anesthesia Plan: MAC   Post-op Pain Management:    Induction:   PONV Risk Score and Plan:   Airway Management Planned:   Additional Equipment:   Intra-op Plan:   Post-operative Plan:   Informed Consent: I have reviewed the patients History and Physical, chart, labs and discussed the procedure including the risks, benefits and alternatives for the proposed anesthesia with the patient or authorized representative who has indicated his/her understanding and acceptance.     Dental Advisory Given  Plan Discussed with: CRNA  Anesthesia Plan Comments:         Anesthesia Quick Evaluation

## 2021-06-12 NOTE — Anesthesia Procedure Notes (Signed)
Procedure Name: MAC Date/Time: 06/12/2021 8:02 AM Performed by: Orlie Dakin, CRNA Pre-anesthesia Checklist: Patient identified, Emergency Drugs available, Suction available and Patient being monitored Patient Re-evaluated:Patient Re-evaluated prior to induction Oxygen Delivery Method: Nasal cannula Placement Confirmation: positive ETCO2

## 2021-06-12 NOTE — Interval H&P Note (Signed)
History and Physical Interval Note:  06/12/2021 7:50 AM  Craig [name]  has presented today for surgery, with the diagnosis of hpermature cataract right eye.  The various methods of treatment have been discussed with the patient and family. After consideration of risks, benefits and other options for treatment, the patient has consented to  Procedure(s) with comments: CATARACT EXTRACTION PHACO AND INTRAOCULAR LENS PLACEMENT (IOC) (Right) - right as a surgical intervention.  The patient's history has been reviewed, patient examined, no change in status, stable for surgery.  I have reviewed the patient's chart and labs.  Questions were answered to the patient's satisfaction.     Baruch Goldmann

## 2021-06-12 NOTE — Discharge Instructions (Signed)
Please discharge patient when stable, will follow up today with Dr. Fabianna Keats at the [facility] [place] office immediately following discharge.  Leave shield in place until visit.  All paperwork with discharge instructions will be given at the office.  [facility] Address:  [address]  [place], [place] [address]  

## 2021-06-12 NOTE — Anesthesia Postprocedure Evaluation (Signed)
Anesthesia Post Note  Patient: Craig [name]  Procedure(s) Performed: CATARACT EXTRACTION PHACO AND INTRAOCULAR LENS PLACEMENT (IOC) (Right: Eye)  Patient location during evaluation: Phase II Anesthesia Type: MAC Level of consciousness: awake Pain management: pain level controlled Vital Signs Assessment: post-procedure vital signs reviewed and stable Respiratory status: spontaneous breathing and respiratory function stable Cardiovascular status: blood pressure returned to baseline and stable Postop Assessment: no headache and no apparent nausea or vomiting Anesthetic complications: no Comments: Late entry   No notable events documented.   Last Vitals:  Vitals:   06/12/21 0701 06/12/21 0823  BP: (!) 94/53 113/71  Pulse:  (!) 54  Resp: 18 18  Temp: 36.4 C 36.6 C  SpO2: 97% 98%    Last Pain:  Vitals:   06/12/21 0823  TempSrc: Oral  PainSc: [place]

## 2021-06-16 ENCOUNTER — Encounter (HOSPITAL_COMMUNITY): Payer: Self-pay | Admitting: Ophthalmology

## 2021-06-17 DIAGNOSIS — R252 Cramp and spasm: Secondary | ICD-10-CM | POA: Diagnosis not present

## 2021-06-17 DIAGNOSIS — G8928 Other chronic postprocedural pain: Secondary | ICD-10-CM | POA: Diagnosis not present

## 2021-06-17 DIAGNOSIS — M546 Pain in thoracic spine: Secondary | ICD-10-CM | POA: Diagnosis not present

## 2021-06-17 DIAGNOSIS — G8921 Chronic pain due to trauma: Secondary | ICD-10-CM | POA: Diagnosis not present

## 2021-06-18 DIAGNOSIS — Z23 Encounter for immunization: Secondary | ICD-10-CM | POA: Diagnosis not present

## 2021-07-13 DIAGNOSIS — J449 Chronic obstructive pulmonary disease, unspecified: Secondary | ICD-10-CM | POA: Diagnosis not present

## 2021-07-13 DIAGNOSIS — F17210 Nicotine dependence, cigarettes, uncomplicated: Secondary | ICD-10-CM | POA: Diagnosis not present

## 2021-07-13 DIAGNOSIS — Z1389 Encounter for screening for other disorder: Secondary | ICD-10-CM | POA: Diagnosis not present

## 2021-07-13 DIAGNOSIS — E039 Hypothyroidism, unspecified: Secondary | ICD-10-CM | POA: Diagnosis not present

## 2021-07-13 DIAGNOSIS — Z0001 Encounter for general adult medical examination with abnormal findings: Secondary | ICD-10-CM | POA: Diagnosis not present

## 2021-07-13 DIAGNOSIS — F17200 Nicotine dependence, unspecified, uncomplicated: Secondary | ICD-10-CM | POA: Diagnosis not present

## 2021-07-15 DIAGNOSIS — M546 Pain in thoracic spine: Secondary | ICD-10-CM | POA: Diagnosis not present

## 2021-07-15 DIAGNOSIS — G8921 Chronic pain due to trauma: Secondary | ICD-10-CM | POA: Diagnosis not present

## 2021-07-15 DIAGNOSIS — R252 Cramp and spasm: Secondary | ICD-10-CM | POA: Diagnosis not present

## 2021-07-15 DIAGNOSIS — G8928 Other chronic postprocedural pain: Secondary | ICD-10-CM | POA: Diagnosis not present

## 2021-08-10 DIAGNOSIS — F17200 Nicotine dependence, unspecified, uncomplicated: Secondary | ICD-10-CM | POA: Diagnosis not present

## 2021-08-10 DIAGNOSIS — J449 Chronic obstructive pulmonary disease, unspecified: Secondary | ICD-10-CM | POA: Diagnosis not present

## 2021-10-10 DIAGNOSIS — J449 Chronic obstructive pulmonary disease, unspecified: Secondary | ICD-10-CM | POA: Diagnosis not present

## 2021-10-15 DIAGNOSIS — N390 Urinary tract infection, site not specified: Secondary | ICD-10-CM | POA: Diagnosis not present

## 2021-11-10 DIAGNOSIS — J449 Chronic obstructive pulmonary disease, unspecified: Secondary | ICD-10-CM | POA: Diagnosis not present

## 2021-11-10 DIAGNOSIS — M1990 Unspecified osteoarthritis, unspecified site: Secondary | ICD-10-CM | POA: Diagnosis not present

## 2021-12-25 DIAGNOSIS — J40 Bronchitis, not specified as acute or chronic: Secondary | ICD-10-CM | POA: Diagnosis not present

## 2021-12-25 DIAGNOSIS — R059 Cough, unspecified: Secondary | ICD-10-CM | POA: Diagnosis not present

## 2021-12-25 DIAGNOSIS — Z20822 Contact with and (suspected) exposure to covid-19: Secondary | ICD-10-CM | POA: Diagnosis not present

## 2021-12-25 DIAGNOSIS — J449 Chronic obstructive pulmonary disease, unspecified: Secondary | ICD-10-CM | POA: Diagnosis not present

## 2021-12-28 ENCOUNTER — Other Ambulatory Visit: Payer: Self-pay

## 2021-12-28 ENCOUNTER — Emergency Department (HOSPITAL_COMMUNITY)
Admission: EM | Admit: 2021-12-28 | Discharge: 2021-12-28 | Disposition: A | Discharge status: Home / Self Care | Payer: Medicare Other | Attending: Emergency Medicine | Admitting: Emergency Medicine

## 2021-12-28 ENCOUNTER — Encounter (HOSPITAL_COMMUNITY): Payer: Self-pay

## 2021-12-28 DIAGNOSIS — J449 Chronic obstructive pulmonary disease, unspecified: Secondary | ICD-10-CM | POA: Diagnosis not present

## 2021-12-28 DIAGNOSIS — M791 Myalgia, unspecified site: Secondary | ICD-10-CM | POA: Diagnosis not present

## 2021-12-28 DIAGNOSIS — M79675 Pain in left toe(s): Secondary | ICD-10-CM | POA: Insufficient documentation

## 2021-12-28 DIAGNOSIS — Z760 Encounter for issue of repeat prescription: Secondary | ICD-10-CM | POA: Diagnosis not present

## 2021-12-28 DIAGNOSIS — Z91148 Patient's other noncompliance with medication regimen for other reason: Secondary | ICD-10-CM | POA: Insufficient documentation

## 2021-12-28 DIAGNOSIS — R52 Pain, unspecified: Secondary | ICD-10-CM

## 2021-12-28 MED ORDER — TRAMADOL HCL 50 MG PO TABS: 50.0000 mg | ORAL_TABLET | Freq: Four times a day (QID) | ORAL | 0 refills | Status: DC | PRN

## 2021-12-28 MED ORDER — TRAMADOL HCL 50 MG PO TABS: 50.0000 mg | ORAL_TABLET | Freq: Four times a day (QID) | ORAL | 0 refills | Status: AC | PRN

## 2021-12-28 NOTE — Discharge Instructions (Signed)
You were seen in the emergency department for body aches, toe pain, and assistance with medications.  We have gone over your medications and when to take them. I am refilling your Tramadol, which you can take every 6 hours as needed for severe pain.  Please follow up with your primary doctor for long term medication management.   I've also attached the information for the podiatrist for you to go and have your feet looked at.

## 2021-12-28 NOTE — ED Provider Notes (Signed)
[facility] EMERGENCY DEPARTMENT Provider Note   CSN: [id number] Arrival date & time: 12/28/21  6578     History  Chief Complaint  Patient presents with   PAIN ALL OVER    Craig [name] is a 61 y.o. male who presents the emergency department for a "all over checkup".  Currently lives with helper from previous group home.  She states that patient's been more agitated the past few days, and is unsure if patient's been taking his prescribed medication.  Today patient is complaining of "pain all over", both physically and mentally.  Reports a history of PTSD, does not believe he has taken his medication for several days because he forgot.  Also complaining of pain in his left great toe at the nail.  No injury.  Has not tried trimming it himself, and is interested in seeing podiatry.  HPI     Home Medications Prior to Admission medications   Medication Sig Start Date End Date Taking? Authorizing Provider  traMADol (ULTRAM) 50 MG tablet Take 1 tablet (50 mg total) by mouth every 6 (six) hours as needed. 12/28/21  Yes Kalasia Crafton T, PA-C  albuterol (PROVENTIL HFA;VENTOLIN HFA) 108 (90 Base) MCG/ACT inhaler Inhale 2 puffs into the lungs every 6 (six) hours as needed for wheezing or shortness of breath. 01/06/16   Oval Linsey, MD  DULoxetine (CYMBALTA) 60 MG capsule Take 60 mg by mouth daily. 12/10/21   [provider]  gabapentin (NEURONTIN) 800 MG tablet Take 800 mg by mouth 3 (three) times daily. 12/10/21   [provider]  hydrOXYzine (ATARAX/VISTARIL) 25 MG tablet Take 1 tablet (25 mg total) by mouth 3 (three) times daily as needed for anxiety. 05/30/20   Antonieta Pert, MD  hydrOXYzine (VISTARIL) 25 MG capsule Take 25 mg by mouth 3 (three) times daily. 12/10/21   [provider]  INCRUSE ELLIPTA 62.5 MCG/ACT AEPB Inhale 1 puff into the lungs daily. 12/10/21   [provider]  ipratropium-albuterol (DUONEB) 0.5-2.5 (3) MG/3ML SOLN Take 3 mLs by  nebulization every 4 (four) hours as needed. 01/06/16   Oval Linsey, MD  levothyroxine (SYNTHROID) 75 MCG tablet Take 1 tablet (75 mcg total) by mouth daily. 05/30/20   Antonieta Pert, MD  lithium carbonate (LITHOBID) 300 MG CR tablet Take 2 tablets (600 mg total) by mouth 2 (two) times daily. 05/30/20   Antonieta Pert, MD  mirtazapine (REMERON) 15 MG tablet Take 15 mg by mouth at bedtime. 12/10/21   [provider]  OLANZapine (ZYPREXA) 5 MG tablet Take 5 mg by mouth at bedtime. 12/10/21   [provider]      Allergies    Patient has no known allergies.    Review of Systems   Review of Systems  Musculoskeletal:  Positive for myalgias.       Toe pain  Psychiatric/Behavioral:  Positive for agitation.   All other systems reviewed and are negative.   Physical Exam Updated Vital Signs BP 118/67   Pulse 74   Temp 98 F (36.7 C) (Oral)   Resp 18   Ht 5\' 6"  (1.676 m)   Wt 70.3 kg   SpO2 100%   BMI 25.02 kg/m  Physical Exam Vitals and nursing note reviewed.  Constitutional:      Appearance: Normal appearance.  HENT:     Head: Normocephalic and atraumatic.  Eyes:     Conjunctiva/sclera: Conjunctivae normal.  Pulmonary:     Effort: Pulmonary effort is normal.  No respiratory distress.  Musculoskeletal:     Comments: Moving all extremities without difficulty  Feet:     Right foot:     Toenail Condition: Right toenails are abnormally thick and long.     Left foot:     Toenail Condition: Left toenails are abnormally thick and long.     Comments: Long and thick toenails, no obvious ingrown toenail Skin:    General: Skin is warm and dry.  Neurological:     Mental Status: He is alert.  Psychiatric:        Attention and Perception: Attention and perception normal.        Mood and Affect: Mood normal.        Speech: Speech is rapid and pressured.        Behavior: Behavior normal.        Thought Content: Thought content normal.     ED Results /  Procedures / Treatments   Labs (all labs ordered are listed, but only abnormal results are displayed) Labs Reviewed - No data to display  EKG None  Radiology No results found.  Procedures Procedures    Medications Ordered in ED Medications - No data to display  ED Course/ Medical Decision Making/ A&P                           Medical Decision Making  Patient is a 61 year old male with history of bipolar 1 disorder, COPD, PTSD who is currently living with caregiver from previous group home.  Before the patient was homeless.  He presents today with complaints of "pain all over", as he is out of his prescribed tramadol.  Also complaining of pain associated with long toenails.  On exam patient is clinically well-appearing.  While his speech is rapid and slightly pressured, he is alert and oriented.  He cannot localize his pain to any specific place except for his left great toe, which has a long and thick toenail but does not appear ingrown.  He is moving all extremities without difficulty or pain.  Went through patient's prescribed medications that were brought with him.  Appears that he is out of his prescribed tramadol, but has plenty left of his daily medications including gabapentin, hydroxyzine, olanzapine, levothyroxine, baclofen.  Explained to patient how and when to take his medications, and he is able to explain this back to me.  After consideration of the patient's chief complaint and my overall exam, I feel that emergency department workup does not suggest an emergent condition requiring admission or immediate intervention beyond what has been performed at this time. The plan is: refill tramadol prescription, encourage medication compliance, provide podiatry follow up, and recommend PCP follow up for long term medication management. The patient is safe for discharge and has been instructed to return immediately for worsening symptoms, change in symptoms or any other concerns.  Caregiver agreeable to the plan.         Final Clinical Impression(s) / ED Diagnoses Final diagnoses:  Encounter for medication refill  Generalized body aches  Poor compliance with medication  Pain of left great toe    Rx / DC Orders ED Discharge Orders          Ordered    traMADol (ULTRAM) 50 MG tablet  Every 6 hours PRN        12/28/21 1002           Portions of this report may have been  transcribed using voice recognition software. Every effort was made to ensure accuracy; however, inadvertent computerized transcription errors may be present.    Jeanella Flattery 12/28/21 1012    Mancel Bale, MD 12/28/21 [phone]

## 2021-12-28 NOTE — ED Triage Notes (Signed)
Patient states he is here for maintenance check up. Patient having rambling speech and pressured speech in triage. Patient states he is here for pain all over. Patient is homeless.

## 2022-01-07 DIAGNOSIS — R454 Irritability and anger: Secondary | ICD-10-CM | POA: Diagnosis not present

## 2022-01-07 DIAGNOSIS — R0781 Pleurodynia: Secondary | ICD-10-CM | POA: Diagnosis not present

## 2022-01-07 DIAGNOSIS — R479 Unspecified speech disturbances: Secondary | ICD-10-CM | POA: Diagnosis not present

## 2022-01-07 DIAGNOSIS — Z91199 Patient's noncompliance with other medical treatment and regimen due to unspecified reason: Secondary | ICD-10-CM | POA: Diagnosis not present

## 2022-01-07 DIAGNOSIS — Z20822 Contact with and (suspected) exposure to covid-19: Secondary | ICD-10-CM | POA: Diagnosis not present

## 2022-01-07 DIAGNOSIS — Z720 Tobacco use: Secondary | ICD-10-CM | POA: Diagnosis not present

## 2022-01-07 DIAGNOSIS — Z609 Problem related to social environment, unspecified: Secondary | ICD-10-CM | POA: Diagnosis not present

## 2022-01-07 DIAGNOSIS — J449 Chronic obstructive pulmonary disease, unspecified: Secondary | ICD-10-CM | POA: Diagnosis not present

## 2022-01-07 DIAGNOSIS — G4700 Insomnia, unspecified: Secondary | ICD-10-CM | POA: Diagnosis not present

## 2022-01-07 DIAGNOSIS — Z791 Long term (current) use of non-steroidal anti-inflammatories (NSAID): Secondary | ICD-10-CM | POA: Diagnosis not present

## 2022-01-07 DIAGNOSIS — R0789 Other chest pain: Secondary | ICD-10-CM | POA: Diagnosis not present

## 2022-01-26 DIAGNOSIS — J449 Chronic obstructive pulmonary disease, unspecified: Secondary | ICD-10-CM | POA: Diagnosis not present

## 2022-01-26 DIAGNOSIS — Z79899 Other long term (current) drug therapy: Secondary | ICD-10-CM | POA: Diagnosis not present

## 2022-01-26 DIAGNOSIS — F17210 Nicotine dependence, cigarettes, uncomplicated: Secondary | ICD-10-CM | POA: Diagnosis not present

## 2022-01-26 DIAGNOSIS — E079 Disorder of thyroid, unspecified: Secondary | ICD-10-CM | POA: Diagnosis not present

## 2022-02-09 DIAGNOSIS — M549 Dorsalgia, unspecified: Secondary | ICD-10-CM | POA: Diagnosis not present

## 2022-02-09 DIAGNOSIS — S8991XA Unspecified injury of right lower leg, initial encounter: Secondary | ICD-10-CM | POA: Diagnosis not present

## 2022-02-09 DIAGNOSIS — R6889 Other general symptoms and signs: Secondary | ICD-10-CM | POA: Diagnosis not present

## 2022-02-09 DIAGNOSIS — M25561 Pain in right knee: Secondary | ICD-10-CM | POA: Diagnosis not present

## 2022-02-09 DIAGNOSIS — M25461 Effusion, right knee: Secondary | ICD-10-CM | POA: Diagnosis not present

## 2022-02-09 DIAGNOSIS — Z743 Need for continuous supervision: Secondary | ICD-10-CM | POA: Diagnosis not present

## 2022-02-09 DIAGNOSIS — M1711 Unilateral primary osteoarthritis, right knee: Secondary | ICD-10-CM | POA: Diagnosis not present

## 2022-02-09 DIAGNOSIS — Z5900 Homelessness unspecified: Secondary | ICD-10-CM | POA: Diagnosis not present

## 2022-02-11 DIAGNOSIS — R0602 Shortness of breath: Secondary | ICD-10-CM | POA: Diagnosis not present

## 2022-02-11 DIAGNOSIS — Z743 Need for continuous supervision: Secondary | ICD-10-CM | POA: Diagnosis not present

## 2022-02-11 DIAGNOSIS — M79671 Pain in right foot: Secondary | ICD-10-CM | POA: Diagnosis not present

## 2022-02-11 DIAGNOSIS — Z5900 Homelessness unspecified: Secondary | ICD-10-CM | POA: Diagnosis not present

## 2022-02-11 DIAGNOSIS — M79672 Pain in left foot: Secondary | ICD-10-CM | POA: Diagnosis not present

## 2022-02-11 DIAGNOSIS — G8929 Other chronic pain: Secondary | ICD-10-CM | POA: Diagnosis not present

## 2022-02-11 DIAGNOSIS — I959 Hypotension, unspecified: Secondary | ICD-10-CM | POA: Diagnosis not present

## 2022-02-11 DIAGNOSIS — Z008 Encounter for other general examination: Secondary | ICD-10-CM | POA: Diagnosis not present

## 2022-02-12 DIAGNOSIS — Z91148 Patient's other noncompliance with medication regimen for other reason: Secondary | ICD-10-CM | POA: Diagnosis not present

## 2022-02-12 DIAGNOSIS — E876 Hypokalemia: Secondary | ICD-10-CM | POA: Diagnosis not present

## 2022-02-12 DIAGNOSIS — R079 Chest pain, unspecified: Secondary | ICD-10-CM | POA: Diagnosis not present

## 2022-02-12 DIAGNOSIS — F17210 Nicotine dependence, cigarettes, uncomplicated: Secondary | ICD-10-CM | POA: Diagnosis not present

## 2022-02-12 DIAGNOSIS — Z743 Need for continuous supervision: Secondary | ICD-10-CM | POA: Diagnosis not present

## 2022-02-12 DIAGNOSIS — Z5900 Homelessness unspecified: Secondary | ICD-10-CM | POA: Diagnosis not present

## 2022-02-12 DIAGNOSIS — I444 Left anterior fascicular block: Secondary | ICD-10-CM | POA: Diagnosis not present

## 2022-02-12 DIAGNOSIS — Z79899 Other long term (current) drug therapy: Secondary | ICD-10-CM | POA: Diagnosis not present

## 2022-02-12 DIAGNOSIS — Z20822 Contact with and (suspected) exposure to covid-19: Secondary | ICD-10-CM | POA: Diagnosis not present

## 2022-02-16 DIAGNOSIS — I444 Left anterior fascicular block: Secondary | ICD-10-CM | POA: Diagnosis not present

## 2022-03-18 DIAGNOSIS — Z7409 Other reduced mobility: Secondary | ICD-10-CM | POA: Diagnosis not present

## 2022-03-18 DIAGNOSIS — J449 Chronic obstructive pulmonary disease, unspecified: Secondary | ICD-10-CM | POA: Diagnosis not present

## 2022-03-18 DIAGNOSIS — E079 Disorder of thyroid, unspecified: Secondary | ICD-10-CM | POA: Diagnosis not present

## 2022-03-18 DIAGNOSIS — F17200 Nicotine dependence, unspecified, uncomplicated: Secondary | ICD-10-CM | POA: Diagnosis not present

## 2022-03-18 DIAGNOSIS — M2652 Limited mandibular range of motion: Secondary | ICD-10-CM | POA: Diagnosis not present

## 2022-03-22 DIAGNOSIS — Z79899 Other long term (current) drug therapy: Secondary | ICD-10-CM | POA: Diagnosis not present

## 2022-03-22 DIAGNOSIS — F17200 Nicotine dependence, unspecified, uncomplicated: Secondary | ICD-10-CM | POA: Diagnosis not present

## 2022-04-26 DIAGNOSIS — F17200 Nicotine dependence, unspecified, uncomplicated: Secondary | ICD-10-CM | POA: Diagnosis not present

## 2022-04-27 DIAGNOSIS — F17200 Nicotine dependence, unspecified, uncomplicated: Secondary | ICD-10-CM | POA: Diagnosis not present

## 2022-04-27 DIAGNOSIS — F17290 Nicotine dependence, other tobacco product, uncomplicated: Secondary | ICD-10-CM | POA: Diagnosis not present

## 2022-04-27 DIAGNOSIS — J449 Chronic obstructive pulmonary disease, unspecified: Secondary | ICD-10-CM | POA: Diagnosis not present

## 2022-04-27 DIAGNOSIS — Z79899 Other long term (current) drug therapy: Secondary | ICD-10-CM | POA: Diagnosis not present

## 2022-04-27 DIAGNOSIS — E079 Disorder of thyroid, unspecified: Secondary | ICD-10-CM | POA: Diagnosis not present

## 2022-05-07 DIAGNOSIS — I10 Essential (primary) hypertension: Secondary | ICD-10-CM | POA: Diagnosis not present

## 2022-05-20 DIAGNOSIS — E079 Disorder of thyroid, unspecified: Secondary | ICD-10-CM | POA: Diagnosis not present

## 2022-05-20 DIAGNOSIS — M2652 Limited mandibular range of motion: Secondary | ICD-10-CM | POA: Diagnosis not present

## 2022-05-20 DIAGNOSIS — Z79899 Other long term (current) drug therapy: Secondary | ICD-10-CM | POA: Diagnosis not present

## 2022-05-20 DIAGNOSIS — Z7409 Other reduced mobility: Secondary | ICD-10-CM | POA: Diagnosis not present

## 2022-05-20 DIAGNOSIS — J449 Chronic obstructive pulmonary disease, unspecified: Secondary | ICD-10-CM | POA: Diagnosis not present

## 2022-05-20 DIAGNOSIS — F17200 Nicotine dependence, unspecified, uncomplicated: Secondary | ICD-10-CM | POA: Diagnosis not present

## 2022-05-24 DIAGNOSIS — F17200 Nicotine dependence, unspecified, uncomplicated: Secondary | ICD-10-CM | POA: Diagnosis not present

## 2022-06-24 DIAGNOSIS — Z79899 Other long term (current) drug therapy: Secondary | ICD-10-CM | POA: Diagnosis not present

## 2022-06-24 DIAGNOSIS — F17200 Nicotine dependence, unspecified, uncomplicated: Secondary | ICD-10-CM | POA: Diagnosis not present

## 2022-06-25 DIAGNOSIS — F17290 Nicotine dependence, other tobacco product, uncomplicated: Secondary | ICD-10-CM | POA: Diagnosis not present

## 2022-06-25 DIAGNOSIS — G8929 Other chronic pain: Secondary | ICD-10-CM | POA: Diagnosis not present

## 2022-06-25 DIAGNOSIS — F17200 Nicotine dependence, unspecified, uncomplicated: Secondary | ICD-10-CM | POA: Diagnosis not present

## 2022-06-25 DIAGNOSIS — R42 Dizziness and giddiness: Secondary | ICD-10-CM | POA: Diagnosis not present

## 2022-06-25 DIAGNOSIS — M2652 Limited mandibular range of motion: Secondary | ICD-10-CM | POA: Diagnosis not present

## 2022-06-25 DIAGNOSIS — E559 Vitamin D deficiency, unspecified: Secondary | ICD-10-CM | POA: Diagnosis not present

## 2022-07-09 DIAGNOSIS — Z743 Need for continuous supervision: Secondary | ICD-10-CM | POA: Diagnosis not present

## 2022-07-09 DIAGNOSIS — I10 Essential (primary) hypertension: Secondary | ICD-10-CM | POA: Diagnosis not present

## 2022-07-09 DIAGNOSIS — E079 Disorder of thyroid, unspecified: Secondary | ICD-10-CM | POA: Diagnosis not present

## 2022-07-09 DIAGNOSIS — Z9289 Personal history of other medical treatment: Secondary | ICD-10-CM | POA: Diagnosis not present

## 2022-07-09 DIAGNOSIS — Z79899 Other long term (current) drug therapy: Secondary | ICD-10-CM | POA: Diagnosis not present

## 2022-07-09 DIAGNOSIS — J449 Chronic obstructive pulmonary disease, unspecified: Secondary | ICD-10-CM | POA: Diagnosis not present

## 2022-07-11 DIAGNOSIS — Z7951 Long term (current) use of inhaled steroids: Secondary | ICD-10-CM | POA: Diagnosis not present

## 2022-07-11 DIAGNOSIS — Z743 Need for continuous supervision: Secondary | ICD-10-CM | POA: Diagnosis not present

## 2022-07-11 DIAGNOSIS — E079 Disorder of thyroid, unspecified: Secondary | ICD-10-CM | POA: Diagnosis not present

## 2022-07-11 DIAGNOSIS — Z885 Allergy status to narcotic agent status: Secondary | ICD-10-CM | POA: Diagnosis not present

## 2022-07-11 DIAGNOSIS — Z79891 Long term (current) use of opiate analgesic: Secondary | ICD-10-CM | POA: Diagnosis not present

## 2022-07-11 DIAGNOSIS — R404 Transient alteration of awareness: Secondary | ICD-10-CM | POA: Diagnosis not present

## 2022-07-11 DIAGNOSIS — Z20822 Contact with and (suspected) exposure to covid-19: Secondary | ICD-10-CM | POA: Diagnosis not present

## 2022-07-11 DIAGNOSIS — Z791 Long term (current) use of non-steroidal anti-inflammatories (NSAID): Secondary | ICD-10-CM | POA: Diagnosis not present

## 2022-07-11 DIAGNOSIS — F17210 Nicotine dependence, cigarettes, uncomplicated: Secondary | ICD-10-CM | POA: Diagnosis not present

## 2022-07-14 DIAGNOSIS — J3489 Other specified disorders of nose and nasal sinuses: Secondary | ICD-10-CM | POA: Diagnosis not present

## 2022-07-14 DIAGNOSIS — S0990XA Unspecified injury of head, initial encounter: Secondary | ICD-10-CM | POA: Diagnosis not present

## 2022-07-26 DIAGNOSIS — E079 Disorder of thyroid, unspecified: Secondary | ICD-10-CM | POA: Diagnosis not present

## 2022-07-26 DIAGNOSIS — R42 Dizziness and giddiness: Secondary | ICD-10-CM | POA: Diagnosis not present

## 2022-07-26 DIAGNOSIS — Z79899 Other long term (current) drug therapy: Secondary | ICD-10-CM | POA: Diagnosis not present

## 2022-07-26 DIAGNOSIS — J449 Chronic obstructive pulmonary disease, unspecified: Secondary | ICD-10-CM | POA: Diagnosis not present

## 2022-07-26 DIAGNOSIS — G8929 Other chronic pain: Secondary | ICD-10-CM | POA: Diagnosis not present

## 2022-08-10 ENCOUNTER — Encounter: Payer: Self-pay | Admitting: Internal Medicine

## 2022-08-23 DIAGNOSIS — M2652 Limited mandibular range of motion: Secondary | ICD-10-CM | POA: Diagnosis not present

## 2022-08-23 DIAGNOSIS — Z79899 Other long term (current) drug therapy: Secondary | ICD-10-CM | POA: Diagnosis not present

## 2022-08-23 DIAGNOSIS — F17200 Nicotine dependence, unspecified, uncomplicated: Secondary | ICD-10-CM | POA: Diagnosis not present

## 2022-08-24 DIAGNOSIS — M2652 Limited mandibular range of motion: Secondary | ICD-10-CM | POA: Diagnosis not present

## 2022-08-24 DIAGNOSIS — J449 Chronic obstructive pulmonary disease, unspecified: Secondary | ICD-10-CM | POA: Diagnosis not present

## 2022-08-24 DIAGNOSIS — R42 Dizziness and giddiness: Secondary | ICD-10-CM | POA: Diagnosis not present

## 2022-08-24 DIAGNOSIS — E079 Disorder of thyroid, unspecified: Secondary | ICD-10-CM | POA: Diagnosis not present

## 2022-08-24 DIAGNOSIS — E559 Vitamin D deficiency, unspecified: Secondary | ICD-10-CM | POA: Diagnosis not present

## 2022-08-24 DIAGNOSIS — F17200 Nicotine dependence, unspecified, uncomplicated: Secondary | ICD-10-CM | POA: Diagnosis not present

## 2022-08-24 NOTE — Progress Notes (Deleted)
GI Office Note    Referring Provider: Carrolyn Meiers* Primary Care Physician:  Carrolyn Meiers, MD  Primary Gastroenterologist:  Chief Complaint   No chief complaint on file.    History of Present Illness   Craig [name] is a 62 y.o. male presenting today at the request of Craig [name] ***for abdominal pain.   Labs from February 2024: Hepatitis B surface antigen nonreactive, hepatitis A IgM nonreactive, hepatitis B core IgM nonreactive, hepatitis C antibody negative, white blood cell count 11,300, hemoglobin 14.6, hematocrit 42.2, platelets 297,000, sodium 137, potassium 3.7, creatinine 0.62, total bilirubin 0.5, alkaline phosphatase 96, AST 27, ALT 29, TSH 1.6, urine drug screen positive for cannabinoids, RPR reactive with titer of 1:16   Recent inpatient psychiatric hospitalization for suicidal ideation.  Medications   Current Outpatient Medications  Medication Sig Dispense Refill   albuterol (PROVENTIL HFA;VENTOLIN HFA) 108 (90 Base) MCG/ACT inhaler Inhale 2 puffs into the lungs every 6 (six) hours as needed for wheezing or shortness of breath. 1 Inhaler 2   DULoxetine (CYMBALTA) 60 MG capsule Take 60 mg by mouth daily.     gabapentin (NEURONTIN) 800 MG tablet Take 800 mg by mouth 3 (three) times daily.     hydrOXYzine (ATARAX/VISTARIL) 25 MG tablet Take 1 tablet (25 mg total) by mouth 3 (three) times daily as needed for anxiety. 30 tablet 0   hydrOXYzine (VISTARIL) 25 MG capsule Take 25 mg by mouth 3 (three) times daily.     INCRUSE ELLIPTA 62.5 MCG/ACT AEPB Inhale 1 puff into the lungs daily.     ipratropium-albuterol (DUONEB) 0.5-2.5 (3) MG/3ML SOLN Take 3 mLs by nebulization every 4 (four) hours as needed. 360 mL 1   levothyroxine (SYNTHROID) 75 MCG tablet Take 1 tablet (75 mcg total) by mouth daily. 30 tablet 0   lithium carbonate (LITHOBID) 300 MG CR tablet Take 2 tablets (600 mg total) by mouth 2 (two) times daily. 60 tablet 0    mirtazapine (REMERON) 15 MG tablet Take 15 mg by mouth at bedtime.     OLANZapine (ZYPREXA) 5 MG tablet Take 5 mg by mouth at bedtime.     traMADol (ULTRAM) 50 MG tablet Take 1 tablet (50 mg total) by mouth every 6 (six) hours as needed. 15 tablet 0   No current facility-administered medications for this visit.    Allergies   Allergies as of 08/25/2022   (No Known Allergies)    Past Medical History   Past Medical History:  Diagnosis Date   Bipolar 1 disorder ([place])    COPD (chronic obstructive pulmonary disease) ([place])    PTSD (post-traumatic stress disorder)    Stroke ([place])    left-sided weakness, around 2008    Past Surgical History   Past Surgical History:  Procedure Laterality Date   APPENDECTOMY     BACK SURGERY     CATARACT EXTRACTION W/PHACO Right 06/12/2021   Procedure: CATARACT EXTRACTION PHACO AND INTRAOCULAR LENS PLACEMENT ([place]);  Surgeon: Baruch Goldmann, MD;  Location: [facility];  Service: Ophthalmology;  Laterality: Right;  CDE: 77.97   CHOLECYSTECTOMY N/A 02/28/2017   Procedure: LAPAROSCOPIC CHOLECYSTECTOMY, possible open;  Surgeon: Virl Cagey, MD;  Location: [facility];  Service: General;  Laterality: N/A;    Past Family History   Family History  Problem Relation Age of Onset   Colon cancer Brother        diagnosed around age 6    Pancreatic cancer Neg Hx  Pancreatic disease Neg Hx     Past Social History   Social History   Socioeconomic History   Marital status: Widowed    Spouse name: Not on file   Number of children: Not on file   Years of education: Not on file   Highest education level: Not on file  Occupational History   Not on file  Tobacco Use   Smoking status: Every Day    Packs/day: .5    Types: Cigarettes   Smokeless tobacco: Never   Tobacco comments:    last smoke yesterday  Vaping Use   Vaping Use: Former  Substance and Sexual Activity   Alcohol use: No    Alcohol/week: 1.0 standard drink of alcohol    Types: 1 Glasses  of wine per week    Comment: occ   Drug use: Yes    Types: Marijuana   Sexual activity: Yes  Other Topics Concern   Not on file  Social History Narrative   Not on file   Social Determinants of Health   Financial Resource Strain: Not on file  Food Insecurity: Not on file  Transportation Needs: Not on file  Physical Activity: Not on file  Stress: Not on file  Social Connections: Not on file  Intimate Partner Violence: Not on file    Review of Systems   General: Negative for anorexia, weight loss, fever, chills, fatigue, weakness. Eyes: Negative for vision changes.  ENT: Negative for hoarseness, difficulty swallowing , nasal congestion. CV: Negative for chest pain, angina, palpitations, dyspnea on exertion, peripheral edema.  Respiratory: Negative for dyspnea at rest, dyspnea on exertion, cough, sputum, wheezing.  GI: See history of present illness. GU:  Negative for dysuria, hematuria, urinary incontinence, urinary frequency, nocturnal urination.  MS: Negative for joint pain, low back pain.  Derm: Negative for rash or itching.  Neuro: Negative for weakness, abnormal sensation, seizure, frequent headaches, memory loss,  confusion.  Psych: Negative for anxiety, depression, suicidal ideation, hallucinations.  Endo: Negative for unusual weight change.  Heme: Negative for bruising or bleeding. Allergy: Negative for rash or hives.  Physical Exam   There were no vitals taken for this visit.   General: Well-nourished, well-developed in no acute distress.  Head: Normocephalic, atraumatic.   Eyes: Conjunctiva pink, no icterus. Mouth: Oropharyngeal mucosa moist and pink , no lesions erythema or exudate. Neck: Supple without thyromegaly, masses, or lymphadenopathy.  Lungs: Clear to auscultation bilaterally.  Heart: Regular rate and rhythm, no murmurs rubs or gallops.  Abdomen: Bowel sounds are normal, nontender, nondistended, no hepatosplenomegaly or masses,  no abdominal bruits  or hernia, no rebound or guarding.   Rectal: *** Extremities: No lower extremity edema. No clubbing or deformities.  Neuro: Alert and oriented x 4 , grossly normal neurologically.  Skin: Warm and dry, no rash or jaundice.   Psych: Alert and cooperative, normal mood and affect.  Labs   *** Imaging Studies   No results found.  Assessment       PLAN   ***   Laureen Ochs. Zaccheaus Rumpf, [place], [facility]

## 2022-08-25 ENCOUNTER — Ambulatory Visit: Payer: Medicare Other | Admitting: Gastroenterology

## 2022-09-07 NOTE — Progress Notes (Unsigned)
GI Office Note    Referring Provider: Carlos American FNP [facility] Primary Care Physician:  Carlos American, FNP  Primary Gastroenterologist: Garfield Cornea, MD   Chief Complaint   Chief Complaint  Patient presents with   Abdominal Pain    At site of hernia operation that was done over 10 years ago     History of Present Illness   Craig [name] is a 62 y.o. male presents from group home setting with history of bipolar disorder, thyroid disease, stroke, COPD, PTSD, previous homelessness and noncompliance, recent psychiatric admission (due to homicidal ideation), latent syphilis with positive serology presenting today at the request of Carlos American FNP for abdominal pain.  We saw patient in 2018 when he was admitted for biliary pancreatitis, ultimately he had cholecystectomy at that time. Patient reports remote hiatal hernia repair at [facility] and EGD.     Labs from February 2024: Hepatitis B surface antigen nonreactive, hepatitis A IgM nonreactive, hepatitis B core IgM nonreactive, hepatitis C antibody negative, white blood cell count 11,300, hemoglobin 14.6, hematocrit 42.2, platelets 297,000, sodium 137, potassium 3.7, creatinine 0.62, total bilirubin 0.5, alkaline phosphatase 96, AST 27, ALT 29, TSH 1.6, urine drug screen positive for cannabinoids, RPR reactive with titer of 1:16    Recent inpatient psychiatric hospitalization for suicidal ideation.  Today: states he has soreness/pain at site of his hiatal hernia repair. He has a popping sensation but no visible abnormality. It has been happening for years ever since his surgery. Notices with movement or when he breathes. It is aggravating. No N/V. Good appetite. No heartburn. Sometimes feels like food gets stuck in lower chest. Had remote EGD/ED which helped but noticing symptoms off/on  again. He will have to wait for food to pass or he will vomit. Brother died with colon cancer less than age 12. Patient  has never had a colonoscopy. BMs regular. No melena, brbpr. Patient has noted some lightheadedness and noticed his BP low normal today. He is not sure what his BP normal runs. Plans to discuss with PCP.       Medications   Current Outpatient Medications  Medication Sig Dispense Refill   albuterol (PROVENTIL HFA;VENTOLIN HFA) 108 (90 Base) MCG/ACT inhaler Inhale 2 puffs into the lungs every 6 (six) hours as needed for wheezing or shortness of breath. 1 Inhaler 2   divalproex (DEPAKOTE) 500 MG DR tablet Take 500 mg by mouth 2 (two) times daily.     fluticasone-salmeterol (ADVAIR) 250-50 MCG/ACT AEPB Inhale 1 puff into the lungs 2 (two) times daily.     GOODSENSE ARTHRITIS PAIN 650 MG CR tablet Take by mouth.     INVEGA SUSTENNA 234 MG/1.5ML injection Inject into the muscle.     levothyroxine (SYNTHROID) 75 MCG tablet Take 1 tablet (75 mcg total) by mouth daily. 30 tablet 0   meclizine (ANTIVERT) 25 MG tablet Take 25 mg by mouth 2 (two) times daily as needed.     mirtazapine (REMERON) 15 MG tablet Take 15 mg by mouth at bedtime.     nicotine polacrilex (NICORETTE) 2 MG gum Take by mouth.     traMADol (ULTRAM) 50 MG tablet Take 1 tablet (50 mg total) by mouth every 6 (six) hours as needed. 15 tablet 0   VITAMIN D3 50 MCG (2000 UT) TABS Take 1 tablet by mouth daily.     No current facility-administered medications for this visit.    Allergies   Allergies as of 09/08/2022 -  Review Complete 09/08/2022  Allergen Reaction Noted   Morphine Anxiety 02/09/2022    Past Medical History   Past Medical History:  Diagnosis Date   Bipolar 1 disorder    COPD (chronic obstructive pulmonary disease)    PTSD (post-traumatic stress disorder)    Stroke    left-sided weakness, around 2008    Past Surgical History   Past Surgical History:  Procedure Laterality Date   APPENDECTOMY     BACK SURGERY     CATARACT EXTRACTION W/PHACO Right 06/12/2021   Procedure: CATARACT EXTRACTION PHACO AND  INTRAOCULAR LENS PLACEMENT ([place]);  Surgeon: Baruch Goldmann, MD;  Location: [facility];  Service: Ophthalmology;  Laterality: Right;  CDE: 77.97   CHOLECYSTECTOMY N/A 02/28/2017   Procedure: LAPAROSCOPIC CHOLECYSTECTOMY, possible open;  Surgeon: Virl Cagey, MD;  Location: [facility];  Service: General;  Laterality: N/A;   HIATAL HERNIA REPAIR      Past Family History   Family History  Problem Relation Age of Onset   Colon cancer Brother        diagnosed around age 41    Pancreatic cancer Neg Hx    Pancreatic disease Neg Hx     Past Social History   Social History   Socioeconomic History   Marital status: Widowed    Spouse name: Not on file   Number of children: Not on file   Years of education: Not on file   Highest education level: Not on file  Occupational History   Not on file  Tobacco Use   Smoking status: Every Day    Packs/day: .5    Types: Cigarettes   Smokeless tobacco: Never   Tobacco comments:    last smoke yesterday  Vaping Use   Vaping Use: Former  Substance and Sexual Activity   Alcohol use: No    Alcohol/week: 1.0 standard drink of alcohol    Types: 1 Glasses of wine per week    Comment: occ   Drug use: Not Currently    Types: Marijuana   Sexual activity: Not Currently  Other Topics Concern   Not on file  Social History Narrative   Not on file   Social Determinants of Health   Financial Resource Strain: Not on file  Food Insecurity: Not on file  Transportation Needs: Not on file  Physical Activity: Not on file  Stress: Not on file  Social Connections: Not on file  Intimate Partner Violence: Not on file    Review of Systems   General: Negative for anorexia, weight loss, fever, chills, fatigue, weakness. See hpi Eyes: Negative for vision changes.  ENT: Negative for hoarseness, difficulty swallowing , nasal congestion. CV: Negative for chest pain, angina, palpitations, dyspnea on exertion, peripheral edema.  Respiratory: Negative for  dyspnea at rest, dyspnea on exertion, cough, sputum, wheezing.  GI: See history of present illness. GU:  Negative for dysuria, hematuria, urinary incontinence, urinary frequency, nocturnal urination.  MS: Negative for joint pain. chronic low back pain.  Derm: Negative for rash or itching.  Neuro: Negative for weakness, abnormal sensation, seizure, frequent headaches, memory loss,  confusion.  Psych: Negative for anxiety, depression, suicidal ideation, hallucinations.  Endo: Negative for unusual weight change.  Heme: Negative for bruising or bleeding. Allergy: Negative for rash or hives.  Physical Exam   BP (!) 94/58 (BP Location: Right Arm, Patient Position: Sitting, Cuff Size: Normal)   Pulse 73   Temp 97.8 F (36.6 C) (Oral)   Ht 5\' 7"  (1.702 m)  Wt 172 lb 9.6 oz (78.3 kg)   SpO2 96%   BMI 27.03 kg/m    General: Well-nourished, well-developed in no acute distress.  Head: Normocephalic, atraumatic.   Eyes: Conjunctiva pink, no icterus. Mouth: Oropharyngeal mucosa moist and pink   Neck: Supple without thyromegaly, masses, or lymphadenopathy.  Lungs: Clear to auscultation bilaterally.  Heart: Regular rate and rhythm, no murmurs rubs or gallops.  Abdomen: Bowel sounds are normal, nontender, nondistended, no hepatosplenomegaly or masses,  no abdominal bruits or hernia, no rebound or guarding.   Rectal: not performed Extremities: No lower extremity edema. No clubbing or deformities.  Neuro: Alert and oriented x 4 , grossly normal neurologically.  Skin: Warm and dry, no rash or jaundice.   Psych: Alert and cooperative, normal mood and affect.  Labs   See hpi  Imaging Studies   No results found.  Assessment     Epigastric pain: at site of prior hiatal hernia repair. No obvious abdominal hernia. Site of tenderness of concern is in the suture line. Obtain copy of op note. Patient may benefit from imaging to rule out hernia. Further recommendations to follow.   Dysphagia:  to solid foods, sometimes associated with vomiting. Denies heartburn. Obtain prior EGD report. He may benefit from updated EGD. Further recommendations to follow.   Colon cancer screening: brother had colon cancer as outlined. Patient has never had colonoscopy. He prefers cologuard initially.  Low normal BP: patient reports some vague lightheadedness. Reports he is eating/drinking well. He is not on hypertensive medications or diuretics. He has no bleeding. Recent Hgb normal at 14. Baseline BP unknown.   PLAN   Obtain records for review as outlined.  Cologuard. Follow up with PCP regarding blood pressure concerns.  Laureen Ochs. Craig [name], [place], [facility]

## 2022-09-08 ENCOUNTER — Encounter: Payer: Self-pay | Admitting: Gastroenterology

## 2022-09-08 ENCOUNTER — Ambulatory Visit (INDEPENDENT_AMBULATORY_CARE_PROVIDER_SITE_OTHER): Payer: Medicare Other | Admitting: Gastroenterology

## 2022-09-08 VITALS — BP 94/58 | HR 73 | Temp 97.8°F | Ht 67.0 in | Wt 172.6 lb

## 2022-09-08 DIAGNOSIS — R1310 Dysphagia, unspecified: Secondary | ICD-10-CM | POA: Diagnosis not present

## 2022-09-08 DIAGNOSIS — R031 Nonspecific low blood-pressure reading: Secondary | ICD-10-CM

## 2022-09-08 DIAGNOSIS — R1013 Epigastric pain: Secondary | ICD-10-CM

## 2022-09-08 DIAGNOSIS — Z1211 Encounter for screening for malignant neoplasm of colon: Secondary | ICD-10-CM

## 2022-09-08 NOTE — Patient Instructions (Addendum)
Please follow up with PCP regarding concerns for low blood pressure.  We will request copy of records regarding hernia repair. Further recommendations to follow regarding next step, anticipate need for CT scan of your abdomen.  We will arrange for Cologuard testing for colon cancer screening.

## 2022-09-20 DIAGNOSIS — F17200 Nicotine dependence, unspecified, uncomplicated: Secondary | ICD-10-CM | POA: Diagnosis not present

## 2022-09-20 DIAGNOSIS — F17290 Nicotine dependence, other tobacco product, uncomplicated: Secondary | ICD-10-CM | POA: Diagnosis not present

## 2022-09-20 DIAGNOSIS — Z79899 Other long term (current) drug therapy: Secondary | ICD-10-CM | POA: Diagnosis not present

## 2022-10-09 ENCOUNTER — Telehealth: Payer: Self-pay | Admitting: Gastroenterology

## 2022-10-09 NOTE — Telephone Encounter (Addendum)
Reviewed records available:  Ventral hernia repair 2014-small hernia repaired with 6cm ventral hernia patch placed. Secured to fascia with novafil sutures. Fascia closed over patch with novafil suture.  I suspect he is feeling sutures. He has popping sensation and discomfort present as well.   We could offer to have him see general surgery to see what they think.   We could offer him EGD/ED for dysphagia. ASA 3. Dx: dysphagia, epig pain  Remind them to complete cologuard if not already done.

## 2022-10-11 DIAGNOSIS — Z1211 Encounter for screening for malignant neoplasm of colon: Secondary | ICD-10-CM | POA: Diagnosis not present

## 2022-10-11 NOTE — Telephone Encounter (Signed)
Spoke with caregiver Berneta Sages at the facility, she is going to discuss general surgery referral with pt. She is ready to move forward with scheduling the EGD. Will have pt complete cologuard.

## 2022-10-11 NOTE — Telephone Encounter (Signed)
noted 

## 2022-10-11 NOTE — Telephone Encounter (Signed)
LMOVM to call back to schedule with Dr. Jena Gauss

## 2022-10-12 NOTE — Telephone Encounter (Signed)
LMTCB

## 2022-10-19 LAB — COLOGUARD: COLOGUARD: POSITIVE — AB

## 2022-10-25 ENCOUNTER — Other Ambulatory Visit: Payer: Self-pay | Admitting: *Deleted

## 2022-10-25 ENCOUNTER — Encounter: Payer: Self-pay | Admitting: *Deleted

## 2022-10-25 MED ORDER — PEG 3350-KCL-NA BICARB-NACL 420 G PO SOLR: 4000.0000 mL | Freq: Once | ORAL | 0 refills | Status: AC

## 2022-10-25 NOTE — Telephone Encounter (Signed)
Pt has been scheduled for 12/03/22. Instructions faxed to [facility] and prep sent to the pharmacy.

## 2022-11-09 DIAGNOSIS — E119 Type 2 diabetes mellitus without complications: Secondary | ICD-10-CM | POA: Diagnosis not present

## 2022-11-30 NOTE — Patient Instructions (Signed)
Your procedure is scheduled on: 12/03/2022  Report to [facility] Main Entrance at  11:45   AM.  Call this number if you have problems the morning of surgery: [phone]   Remember:              Follow Directions on the letter you received from Your Physician's office regarding the Bowel Prep              No Smoking the day of Procedure :   Take these medicines the morning of surgery with A SIP OF WATER: Depakote, levothyroxine, meclizine, and tramadol if needed  Use inhalers if needed   Do not wear jewelry, make-up or nail polish.    Do not bring valuables to the hospital.  Contacts, dentures or bridgework may not be worn into surgery.  .   Patients discharged the day of surgery will not be allowed to drive home.     Colonoscopy, Adult, Care After This sheet gives you information about how to care for yourself after your procedure. Your health care provider may also give you more specific instructions. If you have problems or questions, contact your health care provider. What can I expect after the procedure? After the procedure, it is common to have: A small amount of blood in your stool for 24 hours after the procedure. Some gas. Mild abdominal cramping or bloating.  Follow these instructions at home: General instructions  For the first 24 hours after the procedure: Do not drive or use machinery. Do not sign important documents. Do not drink alcohol. Do your regular daily activities at a slower pace than normal. Eat soft, easy-to-digest foods. Rest often. Take over-the-counter or prescription medicines only as told by your health care provider. It is up to you to get the results of your procedure. Ask your health care provider, or the department performing the procedure, when your results will be ready. Relieving cramping and bloating Try walking around when you have cramps or feel bloated. Apply heat to your abdomen as told by your health care provider. Use a heat  source that your health care provider recommends, such as a moist heat pack or a heating pad. Place a towel between your skin and the heat source. Leave the heat on for 20-30 minutes. Remove the heat if your skin turns bright red. This is especially important if you are unable to feel pain, heat, or cold. You may have a greater risk of getting burned. Eating and drinking Drink enough fluid to keep your urine clear or pale yellow. Resume your normal diet as instructed by your health care provider. Avoid heavy or fried foods that are hard to digest. Avoid drinking alcohol for as long as instructed by your health care provider. Contact a health care provider if: You have blood in your stool 2-3 days after the procedure. Get help right away if: You have more than a small spotting of blood in your stool. You pass large blood clots in your stool. Your abdomen is swollen. You have nausea or vomiting. You have a fever. You have increasing abdominal pain that is not relieved with medicine. This information is not intended to replace advice given to you by your health care provider. Make sure you discuss any questions you have with your health care provider. Document Released: 01/06/2004 Document Revised: 02/16/2016 Document Reviewed: 08/05/2015 Elsevier Interactive Patient Education  2018 Elsevier Inc. Upper Endoscopy, Adult, Care After After the procedure, it is common to have a sore  throat. It is also common to have: Mild stomach pain or discomfort. Bloating. Nausea. Follow these instructions at home: The instructions below may help you care for yourself at home. Your health care provider may give you more instructions. If you have questions, ask your health care provider. If you were given a sedative during the procedure, it can affect you for several hours. Do not drive or operate machinery until your health care provider says that it is safe. If you will be going home right after the  procedure, plan to have a responsible adult: Take you home from the hospital or clinic. You will not be allowed to drive. Care for you for the time you are told. Follow instructions from your health care provider about what you may eat and drink. Return to your normal activities as told by your health care provider. Ask your health care provider what activities are safe for you. Take over-the-counter and prescription medicines only as told by your health care provider. Contact a health care provider if you: Have a sore throat that lasts longer than one day. Have trouble swallowing. Have a fever. Get help right away if you: Vomit blood or your vomit looks like coffee grounds. Have bloody, black, or tarry stools. Have a very bad sore throat or you cannot swallow. Have difficulty breathing or very bad pain in your chest or abdomen. These symptoms may be an emergency. Get help right away. Call 911. Do not wait to see if the symptoms will go away. Do not drive yourself to the hospital. Summary After the procedure, it is common to have a sore throat, mild stomach discomfort, bloating, and nausea. If you were given a sedative during the procedure, it can affect you for several hours. Do not drive until your health care provider says that it is safe. Follow instructions from your health care provider about what you may eat and drink. Return to your normal activities as told by your health care provider. This information is not intended to replace advice given to you by your health care provider. Make sure you discuss any questions you have with your health care provider. Document Revised: 09/02/2021 Document Reviewed: 09/02/2021 Elsevier Patient Education  2024 [employer].

## 2022-12-01 ENCOUNTER — Encounter (HOSPITAL_COMMUNITY): Payer: Self-pay

## 2022-12-01 ENCOUNTER — Encounter (HOSPITAL_COMMUNITY)
Admission: RE | Admit: 2022-12-01 | Discharge: 2022-12-01 | Disposition: A | Discharge status: Home / Self Care | Payer: Medicare Other | Source: Ambulatory Visit | Attending: Internal Medicine | Admitting: Internal Medicine

## 2022-12-01 DIAGNOSIS — Z79899 Other long term (current) drug therapy: Secondary | ICD-10-CM

## 2022-12-02 ENCOUNTER — Telehealth: Payer: Self-pay | Admitting: *Deleted

## 2022-12-02 NOTE — Telephone Encounter (Signed)
-----   Message from Lillia Mountain, RN sent at 12/02/2022 10:42 AM EDT ----- Regarding: No show for PAT Craig [name],  Mr Bentson was a no show for PAT  Thanks,  Cliffton Asters RN

## 2022-12-02 NOTE — Telephone Encounter (Signed)
Tried calling pt, received message x 3 your call can't be completed at this time. No other # in chart. Patient will be taken off schedule for now until we can get in touch with patient.

## 2022-12-03 ENCOUNTER — Ambulatory Visit (HOSPITAL_COMMUNITY): Admission: RE | Admit: 2022-12-03 | Payer: Medicare Other | Source: Ambulatory Visit | Admitting: Internal Medicine

## 2022-12-03 ENCOUNTER — Encounter (HOSPITAL_COMMUNITY): Admission: RE | Payer: Self-pay | Source: Ambulatory Visit

## 2022-12-03 SURGERY — COLONOSCOPY WITH PROPOFOL
Anesthesia: Monitor Anesthesia Care

## 2022-12-17 ENCOUNTER — Telehealth: Payer: Self-pay | Admitting: Internal Medicine

## 2022-12-17 NOTE — Telephone Encounter (Signed)
Called Tai. Advised pt was scheduled and he no showed last month. Patient is at [facility] and they had issues with phone last month. She is requesting we get patient scheduled with any provider as soon as possible since Dr. Jena Gauss is booked up. Dr. Tasia Catchings will be the one to have soonest appointment for ASA 3 double procedure. Please advise Verlon Au if this would be okay to do so? Thanks!  Tai advised me to call her [phone] to schedule.

## 2022-12-17 NOTE — Telephone Encounter (Signed)
A message was left by Tai and she said he was a resident and she wanted to see what to do about getting him scheduled.  I saw your messages... the number she left was [phone]

## 2022-12-19 NOTE — Telephone Encounter (Signed)
Since we have not seen him in awhile, please make sure we have updated med list. Also please ask if he has had any change in clinical status since we saw him last iet new diagnosis, in the ER/urgent care/hospital.   If change in medication let me know. If change in clinical status let me know. Otherwise can schedule with Dr. Tasia Catchings.

## 2022-12-20 NOTE — Telephone Encounter (Signed)
Called Tai and received message " your call cannot be completed as dialed" confirmed # is correct and tried again and received same message. Called facility # and had to [facility]

## 2022-12-22 NOTE — Telephone Encounter (Signed)
Called # for facility and Tai again.

## 2022-12-23 NOTE — Telephone Encounter (Signed)
Called again and LMTCB

## 2022-12-29 MED ORDER — PEG 3350-KCL-NA BICARB-NACL 420 G PO SOLR: 4000.0000 mL | Freq: Once | ORAL | 0 refills | Status: AC

## 2022-12-29 MED ORDER — FLEET ENEMA 7-19 GM/118ML RE ENEM: 1.0000 | ENEMA | Freq: Once | RECTAL | 0 refills | Status: AC

## 2022-12-29 MED ORDER — BISACODYL 5 MG PO TBEC: 10.0000 mg | DELAYED_RELEASE_TABLET | Freq: Once | ORAL | 0 refills | Status: AC

## 2022-12-29 NOTE — Telephone Encounter (Signed)
Spoke with Tai. Reports no changes. Pt has been scheduled with Dr. Marletta Lor instead. She advised me to fax instructions/pre-op to her at [phone]. Rx for prep to rx care in [place].

## 2022-12-29 NOTE — Addendum Note (Signed)
Addended by: Armstead Peaks on: 12/29/2022 08:42 AM   Modules accepted: Orders

## 2023-01-03 ENCOUNTER — Encounter: Payer: Self-pay | Admitting: *Deleted

## 2023-01-18 NOTE — Patient Instructions (Signed)
Craig [name]  01/18/2023     @PREFPERIOPPHARMACY @   Your procedure is scheduled on  01/24/2023.   Report to [facility] at  1015  A.M.   Call this number if you have problems the morning of surgery:  [phone]  If you experience any cold or flu symptoms such as cough, fever, chills, shortness of breath, etc. between now and your scheduled surgery, please notify us at the above number.   Remember:  Follow the diet and prep instructions given to you by the office.      Use your inhalers before you come and bring your rescue inhaler with you.     Take these medicines the morning of surgery with A SIP OF WATER                      depakote, levothyroxine, meclizine, tramdol.     Do not wear jewelry, make-up or nail polish, including gel polish,  artificial nails, or any other type of covering on natural nails (fingers and  toes).  Do not wear lotions, powders, or perfumes, or deodorant.  Do not shave 48 hours prior to surgery.  Men may shave face and neck.  Do not bring valuables to the hospital.  [facility] is not responsible for any belongings or valuables.  Contacts, dentures or bridgework may not be worn into surgery.  Leave your suitcase in the car.  After surgery it may be brought to your room.  For patients admitted to the hospital, discharge time will be determined by your treatment team.  Patients discharged the day of surgery will not be allowed to drive home and must have someone with them for 24 hours.    DO NOT smoke tobacco or vape for 24 hors before your procedure.   Please read over the following fact sheets that you were given. Anesthesia Post-op Instructions and Care and Recovery After Surgery      Upper Endoscopy, Adult, Care After After the procedure, it is common to have a sore throat. It is also common to have: Mild stomach pain or discomfort. Bloating. Nausea. Follow these instructions at home: The instructions below may  help you care for yourself at home. Your health care provider may give you more instructions. If you have questions, ask your health care provider. If you were given a sedative during the procedure, it can affect you for several hours. Do not drive or operate machinery until your health care provider says that it is safe. If you will be going home right after the procedure, plan to have a responsible adult: Take you home from the hospital or clinic. You will not be allowed to drive. Care for you for the time you are told. Follow instructions from your health care provider about what you may eat and drink. Return to your normal activities as told by your health care provider. Ask your health care provider what activities are safe for you. Take over-the-counter and prescription medicines only as told by your health care provider. Contact a health care provider if you: Have a sore throat that lasts longer than one day. Have trouble swallowing. Have a fever. Get help right away if you: Vomit blood or your vomit looks like coffee grounds. Have bloody, black, or tarry stools. Have a very bad sore throat or you cannot swallow. Have difficulty breathing or very bad pain in your chest or abdomen. These symptoms may be  an emergency. Get help right away. Call 911. Do not wait to see if the symptoms will go away. Do not drive yourself to the hospital. Summary After the procedure, it is common to have a sore throat, mild stomach discomfort, bloating, and nausea. If you were given a sedative during the procedure, it can affect you for several hours. Do not drive until your health care provider says that it is safe. Follow instructions from your health care provider about what you may eat and drink. Return to your normal activities as told by your health care provider. This information is not intended to replace advice given to you by your health care provider. Make sure you discuss any questions you have  with your health care provider. Document Revised: 09/02/2021 Document Reviewed: 09/02/2021 Elsevier Patient Education  2024 [employer] Esophageal Dilatation Esophageal dilatation, also called esophageal dilation, is a procedure to widen or open a blocked or narrowed part of the esophagus. The esophagus is the part of the body that moves food and liquid from the mouth to the stomach. You may need this procedure if: You have a buildup of scar tissue in your esophagus that makes it difficult, painful, or impossible to swallow. This can be caused by gastroesophageal reflux disease (GERD). You have cancer of the esophagus. There is a problem with how food moves through your esophagus. In some cases, you may need this procedure repeated at a later time to dilate the esophagus gradually. Tell a health care provider about: Any allergies you have. All medicines you are taking, including vitamins, herbs, eye drops, creams, and over-the-counter medicines. Any problems you or family members have had with anesthetic medicines. Any blood disorders you have. Any surgeries you have had. Any medical conditions you have. Any antibiotic medicines you are required to take before dental procedures. Whether you are pregnant or may be pregnant. What are the risks? Generally, this is a safe procedure. However, problems may occur, including: Bleeding due to a tear in the lining of the esophagus. A hole, or perforation, in the esophagus. What happens before the procedure? Ask your health care provider about: Changing or stopping your regular medicines. This is especially important if you are taking diabetes medicines or blood thinners. Taking medicines such as aspirin and ibuprofen. These medicines can thin your blood. Do not take these medicines unless your health care provider tells you to take them. Taking over-the-counter medicines, vitamins, herbs, and supplements. Follow instructions from your health care  provider about eating or drinking restrictions. Plan to have a responsible adult take you home from the hospital or clinic. Plan to have a responsible adult care for you for the time you are told after you leave the hospital or clinic. This is important. What happens during the procedure? You may be given a medicine to help you relax (sedative). A numbing medicine may be sprayed into the back of your throat, or you may gargle the medicine. Your health care provider may perform the dilatation using various surgical instruments, such as: Simple dilators. This instrument is carefully placed in the esophagus to stretch it. Guided wire bougies. This involves using an endoscope to insert a wire into the esophagus. A dilator is passed over this wire to enlarge the esophagus. Then the wire is removed. Balloon dilators. An endoscope with a small balloon is inserted into the esophagus. The balloon is inflated to stretch the esophagus and open it up. The procedure may vary among health care providers and hospitals. What  can I expect after the procedure? Your blood pressure, heart rate, breathing rate, and blood oxygen level will be monitored until you leave the hospital or clinic. Your throat may feel slightly sore and numb. This will get better over time. You will not be allowed to eat or drink until your throat is no longer numb. When you are able to drink, urinate, and sit on the edge of the bed without nausea or dizziness, you may be able to return home. Follow these instructions at home: Take over-the-counter and prescription medicines only as told by your health care provider. If you were given a sedative during the procedure, it can affect you for several hours. Do not drive or operate machinery until your health care provider says that it is safe. Plan to have a responsible adult care for you for the time you are told. This is important. Follow instructions from your health care provider about any  eating or drinking restrictions. Do not use any products that contain nicotine or tobacco, such as cigarettes, e-cigarettes, and chewing tobacco. If you need help quitting, ask your health care provider. Keep all follow-up visits. This is important. Contact a health care provider if: You have a fever. You have pain that is not relieved by medicine. Get help right away if: You have chest pain. You have trouble breathing. You have trouble swallowing. You vomit blood. You have black, tarry, or bloody stools. These symptoms may represent a serious problem that is an emergency. Do not wait to see if the symptoms will go away. Get medical help right away. Call your local emergency services (911 in the [place] Do not drive yourself to the hospital. Summary Esophageal dilatation, also called esophageal dilation, is a procedure to widen or open a blocked or narrowed part of the esophagus. Plan to have a responsible adult take you home from the hospital or clinic. For this procedure, a numbing medicine may be sprayed into the back of your throat, or you may gargle the medicine. Do not drive or operate machinery until your health care provider says that it is safe. This information is not intended to replace advice given to you by your health care provider. Make sure you discuss any questions you have with your health care provider. Document Revised: 10/10/2019 Document Reviewed: 10/10/2019 Elsevier Patient Education  2024 [employer] Colonoscopy, Adult, Care After The following information offers guidance on how to care for yourself after your procedure. Your health care provider may also give you more specific instructions. If you have problems or questions, contact your health care provider. What can I expect after the procedure? After the procedure, it is common to have: A small amount of blood in your stool for 24 hours after the procedure. Some gas. Mild cramping or bloating of your  abdomen. Follow these instructions at home: Eating and drinking  Drink enough fluid to keep your urine pale yellow. Follow instructions from your health care provider about eating or drinking restrictions. Resume your normal diet as told by your health care provider. Avoid heavy or fried foods that are hard to digest. Activity Rest as told by your health care provider. Avoid sitting for a long time without moving. Get up to take short walks every 1-2 hours. This is important to improve blood flow and breathing. Ask for help if you feel weak or unsteady. Return to your normal activities as told by your health care provider. Ask your health care provider what activities are safe for you.  Managing cramping and bloating  Try walking around when you have cramps or feel bloated. If directed, apply heat to your abdomen as told by your health care provider. Use the heat source that your health care provider recommends, such as a moist heat pack or a heating pad. Place a towel between your skin and the heat source. Leave the heat on for 20-30 minutes. Remove the heat if your skin turns bright red. This is especially important if you are unable to feel pain, heat, or cold. You have a greater risk of getting burned. General instructions If you were given a sedative during the procedure, it can affect you for several hours. Do not drive or operate machinery until your health care provider says that it is safe. For the first 24 hours after the procedure: Do not sign important documents. Do not drink alcohol. Do your regular daily activities at a slower pace than normal. Eat soft foods that are easy to digest. Take over-the-counter and prescription medicines only as told by your health care provider. Keep all follow-up visits. This is important. Contact a health care provider if: You have blood in your stool 2-3 days after the procedure. Get help right away if: You have more than a small spotting of  blood in your stool. You have large blood clots in your stool. You have swelling of your abdomen. You have nausea or vomiting. You have a fever. You have increasing pain in your abdomen that is not relieved with medicine. These symptoms may be an emergency. Get help right away. Call 911. Do not wait to see if the symptoms will go away. Do not drive yourself to the hospital. Summary After the procedure, it is common to have a small amount of blood in your stool. You may also have mild cramping and bloating of your abdomen. If you were given a sedative during the procedure, it can affect you for several hours. Do not drive or operate machinery until your health care provider says that it is safe. Get help right away if you have a lot of blood in your stool, nausea or vomiting, a fever, or increased pain in your abdomen. This information is not intended to replace advice given to you by your health care provider. Make sure you discuss any questions you have with your health care provider. Document Revised: 07/06/2022 Document Reviewed: 01/14/2021 Elsevier Patient Education  2024 Else[employer] Inc. Monitored Anesthesia Care, Care After The following information offers guidance on how to care for yourself after your procedure. Your health care provider may also give you more specific instructions. If you have problems or questions, contact your health care provider. What can I expect after the procedure? After the procedure, it is common to have: Tiredness. Little or no memory about what happened during or after the procedure. Impaired judgment when it comes to making decisions. Nausea or vomiting. Some trouble with balance. Follow these instructions at home: For the time period you were told by your health care provider:  Rest. Do not participate in activities where you could fall or become injured. Do not drive or use machinery. Do not drink alcohol. Do not take sleeping pills or medicines  that cause drowsiness. Do not make important decisions or sign legal documents. Do not take care of children on your own. Medicines Take over-the-counter and prescription medicines only as told by your health care provider. If you were prescribed antibiotics, take them as told by your health care provider. Do not stop  using the antibiotic even if you start to feel better. Eating and drinking Follow instructions from your health care provider about what you may eat and drink. Drink enough fluid to keep your urine pale yellow. If you vomit: Drink clear fluids slowly and in small amounts as you are able. Clear fluids include water, ice chips, low-calorie sports drinks, and fruit juice that has water added to it (diluted fruit juice). Eat light and bland foods in small amounts as you are able. These foods include bananas, applesauce, rice, lean meats, toast, and crackers. General instructions  Have a responsible adult stay with you for the time you are told. It is important to have someone help care for you until you are awake and alert. If you have sleep apnea, surgery and some medicines can increase your risk for breathing problems. Follow instructions from your health care provider about wearing your sleep device: When you are sleeping. This includes during daytime naps. While taking prescription pain medicines, sleeping medicines, or medicines that make you drowsy. Do not use any products that contain nicotine or tobacco. These products include cigarettes, chewing tobacco, and vaping devices, such as e-cigarettes. If you need help quitting, ask your health care provider. Contact a health care provider if: You feel nauseous or vomit every time you eat or drink. You feel light-headed. You are still sleepy or having trouble with balance after 24 hours. You get a rash. You have a fever. You have redness or swelling around the IV site. Get help right away if: You have trouble breathing. You  have new confusion after you get home. These symptoms may be an emergency. Get help right away. Call 911. Do not wait to see if the symptoms will go away. Do not drive yourself to the hospital. This information is not intended to replace advice given to you by your health care provider. Make sure you discuss any questions you have with your health care provider. Document Revised: 10/19/2021 Document Reviewed: 10/19/2021 Elsevier Patient Education  2024 [employer].

## 2023-01-19 ENCOUNTER — Other Ambulatory Visit: Payer: Self-pay

## 2023-01-19 ENCOUNTER — Encounter (HOSPITAL_COMMUNITY): Payer: Self-pay

## 2023-01-19 ENCOUNTER — Encounter (HOSPITAL_COMMUNITY)
Admission: RE | Admit: 2023-01-19 | Discharge: 2023-01-19 | Disposition: A | Discharge status: Home / Self Care | Payer: Medicare Other | Source: Ambulatory Visit | Attending: Internal Medicine | Admitting: Internal Medicine

## 2023-01-19 VITALS — BP 100/69 | HR 82 | Temp 97.8°F | Resp 18 | Ht 67.0 in | Wt 172.6 lb

## 2023-01-19 DIAGNOSIS — Z01810 Encounter for preprocedural cardiovascular examination: Secondary | ICD-10-CM | POA: Diagnosis not present

## 2023-01-19 DIAGNOSIS — F17200 Nicotine dependence, unspecified, uncomplicated: Secondary | ICD-10-CM | POA: Insufficient documentation

## 2023-01-19 DIAGNOSIS — R9431 Abnormal electrocardiogram [ECG] [EKG]: Secondary | ICD-10-CM | POA: Insufficient documentation

## 2023-01-24 ENCOUNTER — Ambulatory Visit (HOSPITAL_COMMUNITY)
Admission: RE | Admit: 2023-01-24 | Discharge: 2023-01-24 | Disposition: A | Discharge status: Home / Self Care | Payer: Medicare Other | Source: Ambulatory Visit | Attending: Internal Medicine | Admitting: Internal Medicine

## 2023-01-24 ENCOUNTER — Encounter (HOSPITAL_COMMUNITY)
Admission: RE | Disposition: A | Discharge status: Home / Self Care | Payer: Self-pay | Source: Ambulatory Visit | Attending: Internal Medicine

## 2023-01-24 ENCOUNTER — Ambulatory Visit (HOSPITAL_BASED_OUTPATIENT_CLINIC_OR_DEPARTMENT_OTHER): Payer: Medicare Other | Admitting: Certified Registered"

## 2023-01-24 ENCOUNTER — Encounter (HOSPITAL_COMMUNITY): Payer: Self-pay

## 2023-01-24 ENCOUNTER — Ambulatory Visit (HOSPITAL_COMMUNITY): Payer: Medicare Other | Admitting: Certified Registered"

## 2023-01-24 DIAGNOSIS — F17210 Nicotine dependence, cigarettes, uncomplicated: Secondary | ICD-10-CM | POA: Insufficient documentation

## 2023-01-24 DIAGNOSIS — Z800 Family history of malignant neoplasm of digestive organs: Secondary | ICD-10-CM | POA: Insufficient documentation

## 2023-01-24 DIAGNOSIS — Z8673 Personal history of transient ischemic attack (TIA), and cerebral infarction without residual deficits: Secondary | ICD-10-CM | POA: Insufficient documentation

## 2023-01-24 DIAGNOSIS — D128 Benign neoplasm of rectum: Secondary | ICD-10-CM | POA: Diagnosis not present

## 2023-01-24 DIAGNOSIS — K2970 Gastritis, unspecified, without bleeding: Secondary | ICD-10-CM | POA: Diagnosis not present

## 2023-01-24 DIAGNOSIS — D123 Benign neoplasm of transverse colon: Secondary | ICD-10-CM | POA: Diagnosis not present

## 2023-01-24 DIAGNOSIS — Z1211 Encounter for screening for malignant neoplasm of colon: Secondary | ICD-10-CM | POA: Diagnosis not present

## 2023-01-24 DIAGNOSIS — K635 Polyp of colon: Secondary | ICD-10-CM | POA: Diagnosis not present

## 2023-01-24 DIAGNOSIS — F319 Bipolar disorder, unspecified: Secondary | ICD-10-CM | POA: Insufficient documentation

## 2023-01-24 DIAGNOSIS — J449 Chronic obstructive pulmonary disease, unspecified: Secondary | ICD-10-CM | POA: Diagnosis not present

## 2023-01-24 DIAGNOSIS — Z5986 Financial insecurity: Secondary | ICD-10-CM | POA: Insufficient documentation

## 2023-01-24 DIAGNOSIS — D120 Benign neoplasm of cecum: Secondary | ICD-10-CM | POA: Insufficient documentation

## 2023-01-24 DIAGNOSIS — K449 Diaphragmatic hernia without obstruction or gangrene: Secondary | ICD-10-CM

## 2023-01-24 DIAGNOSIS — Z139 Encounter for screening, unspecified: Secondary | ICD-10-CM | POA: Diagnosis not present

## 2023-01-24 DIAGNOSIS — D122 Benign neoplasm of ascending colon: Secondary | ICD-10-CM | POA: Diagnosis not present

## 2023-01-24 DIAGNOSIS — K648 Other hemorrhoids: Secondary | ICD-10-CM | POA: Diagnosis not present

## 2023-01-24 DIAGNOSIS — D124 Benign neoplasm of descending colon: Secondary | ICD-10-CM | POA: Insufficient documentation

## 2023-01-24 DIAGNOSIS — K222 Esophageal obstruction: Secondary | ICD-10-CM | POA: Diagnosis not present

## 2023-01-24 DIAGNOSIS — J440 Chronic obstructive pulmonary disease with acute lower respiratory infection: Secondary | ICD-10-CM

## 2023-01-24 DIAGNOSIS — K319 Disease of stomach and duodenum, unspecified: Secondary | ICD-10-CM | POA: Diagnosis not present

## 2023-01-24 HISTORY — PX: BIOPSY: SHX5522

## 2023-01-24 HISTORY — PX: POLYPECTOMY: SHX149

## 2023-01-24 HISTORY — PX: COLONOSCOPY WITH PROPOFOL: SHX5780

## 2023-01-24 HISTORY — PX: ESOPHAGOGASTRODUODENOSCOPY (EGD) WITH PROPOFOL: SHX5813

## 2023-01-24 HISTORY — PX: BALLOON DILATION: SHX5330

## 2023-01-24 SURGERY — COLONOSCOPY WITH PROPOFOL
Anesthesia: General

## 2023-01-24 MED ORDER — PANTOPRAZOLE SODIUM 40 MG PO TBEC
40.0000 mg | DELAYED_RELEASE_TABLET | Freq: Two times a day (BID) | ORAL | 11 refills | Status: AC
Start: 2023-01-24 — End: 2024-04-09

## 2023-01-24 MED ORDER — PROPOFOL 10 MG/ML IV BOLUS
INTRAVENOUS | Status: DC | PRN
Start: 2023-01-24 — End: 2023-01-24
  Administered 2023-01-24: 50 mg via INTRAVENOUS
  Administered 2023-01-24: 100 mg via INTRAVENOUS
  Administered 2023-01-24: 50 mg via INTRAVENOUS
  Administered 2023-01-24: 30 mg via INTRAVENOUS

## 2023-01-24 MED ORDER — PHENYLEPHRINE 80 MCG/ML (10ML) SYRINGE FOR IV PUSH (FOR BLOOD PRESSURE SUPPORT)
PREFILLED_SYRINGE | INTRAVENOUS | Status: AC
  Filled 2023-01-24: qty 10

## 2023-01-24 MED ORDER — PHENYLEPHRINE 80 MCG/ML (10ML) SYRINGE FOR IV PUSH (FOR BLOOD PRESSURE SUPPORT)
PREFILLED_SYRINGE | INTRAVENOUS | Status: DC | PRN
Start: 2023-01-24 — End: 2023-01-24
  Administered 2023-01-24 (×7): 160 ug via INTRAVENOUS

## 2023-01-24 MED ORDER — LACTATED RINGERS IV SOLN: INTRAVENOUS | Status: DC

## 2023-01-24 MED ORDER — PROPOFOL 500 MG/50ML IV EMUL
INTRAVENOUS | Status: DC | PRN
  Administered 2023-01-24: 150 ug/kg/min via INTRAVENOUS

## 2023-01-24 MED ORDER — LIDOCAINE HCL (CARDIAC) PF 100 MG/5ML IV SOSY
PREFILLED_SYRINGE | INTRAVENOUS | Status: DC | PRN
  Administered 2023-01-24: 50 mg via INTRAVENOUS

## 2023-01-24 NOTE — Anesthesia Preprocedure Evaluation (Signed)
Anesthesia Evaluation  Patient identified by MRN, date of birth, ID band Patient awake    Reviewed: Allergy & Precautions, H&P , NPO status , Patient's Chart, lab work & pertinent test results, reviewed documented beta blocker date and time   Airway Mallampati: II  TM Distance: >3 FB Neck ROM: full    Dental no notable dental hx.    Pulmonary neg pulmonary ROS, COPD, Current Smoker   Pulmonary exam normal breath sounds clear to auscultation       Cardiovascular Exercise Tolerance: Good negative cardio ROS  Rhythm:regular Rate:Normal     Neuro/Psych  PSYCHIATRIC DISORDERS Anxiety  Bipolar Disorder   CVA negative neurological ROS  negative psych ROS   GI/Hepatic negative GI ROS, Neg liver ROS,,,  Endo/Other  negative endocrine ROS    Renal/GU negative Renal ROS  negative genitourinary   Musculoskeletal   Abdominal   Peds  Hematology negative hematology ROS (+)   Anesthesia Other Findings   Reproductive/Obstetrics negative OB ROS                             Anesthesia Physical Anesthesia Plan  ASA: 3  Anesthesia Plan: General   Post-op Pain Management:    Induction:   PONV Risk Score and Plan: Propofol infusion  Airway Management Planned:   Additional Equipment:   Intra-op Plan:   Post-operative Plan:   Informed Consent: I have reviewed the patients History and Physical, chart, labs and discussed the procedure including the risks, benefits and alternatives for the proposed anesthesia with the patient or authorized representative who has indicated his/her understanding and acceptance.     Dental Advisory Given  Plan Discussed with: CRNA  Anesthesia Plan Comments:        Anesthesia Quick Evaluation

## 2023-01-24 NOTE — Op Note (Signed)
[facility] Patient Name: Craig [name] Procedure Date: 01/24/2023 10:41 AM MRN: [id number] Date of Birth: 04/02/1961 Attending MD: Hennie Duos. Marletta Lor , [place], [id number] CSN: [id number] Age: 62 Admit Type: Outpatient Procedure:                Upper GI endoscopy Indications:              Dysphagia, Heartburn Providers:                Hennie Duos. Marletta Lor, DO, Crystal Page, [employer] Glaze Tech, Technician Referring MD:              Medicines:                See the Anesthesia note for documentation of the                            administered medications Complications:            No immediate complications. Estimated Blood Loss:     Estimated blood loss was minimal. Procedure:                Pre-Anesthesia Assessment:                           - The anesthesia plan was to use monitored                            anesthesia care (MAC).                           After obtaining informed consent, the endoscope was                            passed under direct vision. Throughout the                            procedure, the patient's blood pressure, pulse, and                            oxygen saturations were monitored continuously. The                            GIF-H190 ([id number]) scope was introduced through the                            mouth, and advanced to the second part of duodenum.                            The upper GI endoscopy was accomplished without                            difficulty. The patient tolerated the procedure                            well. Scope In: 10:56:23 AM  Scope Out: 11:01:15 AM Total Procedure Duration: 0 hours 4 minutes 52 seconds  Findings:      A small hiatal hernia was present.      A moderate Schatzki ring was found in the distal esophagus. A TTS       dilator was passed through the scope. Dilation with an 18-19-20 mm       balloon dilator was performed to 18 mm. The dilation site was examined       and showed  mild mucosal disruption and moderate improvement in luminal       narrowing.      Diffuse moderate inflammation characterized by erythema and linear       erosions was found in the entire examined stomach. Biopsies were taken       with a cold forceps for Helicobacter pylori testing.      The duodenal bulb, first portion of the duodenum and second portion of       the duodenum were normal. Impression:               - Small hiatal hernia.                           - Moderate Schatzki ring. Dilated.                           - Gastritis. Biopsied.                           - Normal duodenal bulb, first portion of the                            duodenum and second portion of the duodenum. Moderate Sedation:      Per Anesthesia Care Recommendation:           - Patient has a contact number available for                            emergencies. The signs and symptoms of potential                            delayed complications were discussed with the                            patient. Return to normal activities tomorrow.                            Written discharge instructions were provided to the                            patient.                           - Resume previous diet.                           - Continue present medications.                           - Await pathology  results.                           - Repeat upper endoscopy PRN for retreatment.                           - Return to GI clinic in 3 months.                           - Use a proton pump inhibitor PO BID. Procedure Code(s):        --- Professional ---                           [phone], Esophagogastroduodenoscopy, flexible,                            transoral; with transendoscopic balloon dilation of                            esophagus (less than 30 mm diameter)                           43239, 59, Esophagogastroduodenoscopy, flexible,                            transoral; with biopsy, single or  multiple Diagnosis Code(s):        --- Professional ---                           K44.9, Diaphragmatic hernia without obstruction or                            gangrene                           K22.2, Esophageal obstruction                           K29.70, Gastritis, unspecified, without bleeding                           R13.10, Dysphagia, unspecified                           R12, Heartburn CPT copyright 2022 [employer] Medical Association. All rights reserved. The codes documented in this report are preliminary and upon coder review may  be revised to meet current compliance requirements. Hennie Duos. Marletta Lor, DO Hennie Duos. Marletta Lor, DO 01/24/2023 11:03:51 AM This report has been signed electronically. Number of Addenda: 0

## 2023-01-24 NOTE — H&P (Signed)
Primary Care Physician:  Patient, No Pcp Per Primary Gastroenterologist:  Dr. Marletta Lor  Pre-Procedure History & Physical: HPI:  Craig [name] is a 62 y.o. male is  here for an EGD with possible dilation due to chronic GERD, dysphagia, and colonoscopy for high risk screening purposes, family history of colon cancer in brother.    Past Medical History:  Diagnosis Date   Bipolar 1 disorder (HCC)    COPD (chronic obstructive pulmonary disease) (HCC)    PTSD (post-traumatic stress disorder)    Stroke (HCC)    left-sided weakness, around 2008    Past Surgical History:  Procedure Laterality Date   APPENDECTOMY     BACK SURGERY     CATARACT EXTRACTION W/PHACO Right 06/12/2021   Procedure: CATARACT EXTRACTION PHACO AND INTRAOCULAR LENS PLACEMENT (IOC);  Surgeon: Fabio Pierce, MD;  Location: [facility];  Service: Ophthalmology;  Laterality: Right;  CDE: 77.97   CHOLECYSTECTOMY N/A 02/28/2017   Procedure: LAPAROSCOPIC CHOLECYSTECTOMY, possible open;  Surgeon: Lucretia Roers, MD;  Location: [facility];  Service: General;  Laterality: N/A;   HIATAL HERNIA REPAIR      Prior to Admission medications   Medication Sig Start Date End Date Taking? Authorizing Provider  albuterol (PROVENTIL HFA;VENTOLIN HFA) 108 (90 Base) MCG/ACT inhaler Inhale 2 puffs into the lungs every 6 (six) hours as needed for wheezing or shortness of breath. 01/06/16  Yes Oval Linsey, MD  divalproex (DEPAKOTE) 500 MG DR tablet Take 500 mg by mouth 2 (two) times daily.   Yes [provider]  fluticasone-salmeterol (ADVAIR) 250-50 MCG/ACT AEPB Inhale 1 puff into the lungs 2 (two) times daily. 08/27/22  Yes [provider]  GOODSENSE ARTHRITIS PAIN 650 MG CR tablet Take by mouth.   Yes [provider]  INVEGA SUSTENNA 234 MG/1.5ML injection Inject into the muscle. 08/16/22  Yes [provider]  levothyroxine (SYNTHROID) 75 MCG tablet Take 1 tablet (75 mcg total) by mouth daily. 05/30/20  Yes  Antonieta Pert, MD  meclizine (ANTIVERT) 25 MG tablet Take 25 mg by mouth 2 (two) times daily as needed. 08/24/22  Yes [provider]  mirtazapine (REMERON) 15 MG tablet Take 15 mg by mouth at bedtime. 12/10/21  Yes [provider]  nicotine polacrilex (NICORETTE) 2 MG gum Take by mouth.   Yes [provider]  senna (SENOKOT) 8.6 MG TABS tablet Take 1 tablet by mouth in the morning and at bedtime.   Yes [provider]  traMADol (ULTRAM) 50 MG tablet Take 1 tablet (50 mg total) by mouth every 6 (six) hours as needed. 12/28/21  Yes Roemhildt, Lorin T, PA-C  VITAMIN D3 50 MCG (2000 UT) TABS Take 1 tablet by mouth daily. 07/13/22  Yes [provider]    Allergies as of 12/29/2022 - Review Complete 09/08/2022  Allergen Reaction Noted   Morphine Anxiety 02/09/2022    Family History  Problem Relation Age of Onset   Colon cancer Brother        diagnosed around age 80    Pancreatic cancer Neg Hx    Pancreatic disease Neg Hx     Social History   Socioeconomic History   Marital status: Widowed    Spouse name: Not on file   Number of children: Not on file   Years of education: Not on file   Highest education level: Not on file  Occupational History   Not on file  Tobacco Use   Smoking status: Every Day  Current packs/day: 0.50    Types: Cigarettes   Smokeless tobacco: Never   Tobacco comments:    last smoke yesterday  Vaping Use   Vaping status: Former  Substance and Sexual Activity   Alcohol use: No    Alcohol/week: 1.0 standard drink of alcohol    Types: 1 Glasses of wine per week   Drug use: Not Currently    Types: Marijuana   Sexual activity: Not Currently  Other Topics Concern   Not on file  Social History Narrative   Not on file   Social Determinants of Health   Financial Resource Strain: Medium Risk (01/27/2022)   Received from [facility], [facility]   Overall Financial Resource Strain (CARDIA)    Difficulty of  Paying Living Expenses: Somewhat hard  Food Insecurity: Not on file  Transportation Needs: Not on file  Physical Activity: Not on file  Stress: Stress Concern Present (01/27/2022)   Received from [facility], [facility]   [employer] [facility] - Occupational Stress Questionnaire    Feeling of Stress : To some extent  Social Connections: Unknown (01/26/2022)   Received from [facility], [facility]   Social Network    Social Network: Not on file  Intimate Partner Violence: Unknown (01/26/2022)   Received from [facility], [facility]   HITS    Physically Hurt: Not on file    Insult or Talk Down To: Not on file    Threaten Physical Harm: Not on file    Scream or Curse: Not on file    Review of Systems: General: Negative for fever, chills, fatigue, weakness. Eyes: Negative for vision changes.  ENT: Negative for hoarseness, difficulty swallowing , nasal congestion. CV: Negative for chest pain, angina, palpitations, dyspnea on exertion, peripheral edema.  Respiratory: Negative for dyspnea at rest, dyspnea on exertion, cough, sputum, wheezing.  GI: See history of present illness. GU:  Negative for dysuria, hematuria, urinary incontinence, urinary frequency, nocturnal urination.  MS: Negative for joint pain, low back pain.  Derm: Negative for rash or itching.  Neuro: Negative for weakness, abnormal sensation, seizure, frequent headaches, memory loss, confusion.  Psych: Negative for anxiety, depression Endo: Negative for unusual weight change.  Heme: Negative for bruising or bleeding. Allergy: Negative for rash or hives.  Physical Exam: Vital signs in last 24 hours: Temp:  [97.7 F (36.5 C)] 97.7 F (36.5 C) (08/19 1040) Pulse Rate:  [100] 100 (08/19 1040) Resp:  [20] 20 (08/19 1040) BP: (126)/(95) 126/95 (08/19 1040) SpO2:  [97 %] 97 % (08/19 1040) Weight:  [78.3 kg] 78.3 kg (08/19 1040)   General:   Alert,  Well-developed, well-nourished,  pleasant and cooperative in NAD Head:  Normocephalic and atraumatic. Eyes:  Sclera clear, no icterus.   Conjunctiva pink. Ears:  Normal auditory acuity. Nose:  No deformity, discharge,  or lesions. Msk:  Symmetrical without gross deformities. Normal posture. Extremities:  Without clubbing or edema. Neurologic:  Alert and  oriented x4;  grossly normal neurologically. Skin:  Intact without significant lesions or rashes. Psych:  Alert and cooperative. Normal mood and affect.   Impression/Plan: Craig [name] is here for an EGD with possible dilation due to chronic GERD, dysphagia, and colonoscopy for high risk screening purposes, family history of colon cancer in brother.    Risks, benefits, limitations, imponderables and alternatives regarding procedure have been reviewed with the patient. Questions have been answered. All parties agreeable.

## 2023-01-24 NOTE — Anesthesia Procedure Notes (Signed)
Date/Time: 01/24/2023 10:58 AM  Performed by: Julian Reil, CRNAPre-anesthesia Checklist: Patient identified, Emergency Drugs available, Suction available and Patient being monitored Patient Re-evaluated:Patient Re-evaluated prior to induction Oxygen Delivery Method: Nasal cannula Induction Type: IV induction Placement Confirmation: positive ETCO2

## 2023-01-24 NOTE — Discharge Instructions (Signed)
EGD Discharge instructions Please read the instructions outlined below and refer to this sheet in the next few weeks. These discharge instructions provide you with general information on caring for yourself after you leave the hospital. Your doctor may also give you specific instructions. While your treatment has been planned according to the most current medical practices available, unavoidable complications occasionally occur. If you have any problems or questions after discharge, please call your doctor. ACTIVITY You may resume your regular activity but move at a slower pace for the next 24 hours.  Take frequent rest periods for the next 24 hours.  Walking will help expel (get rid of) the air and reduce the bloated feeling in your abdomen.  No driving for 24 hours (because of the anesthesia (medicine) used during the test).  You may shower.  Do not sign any important legal documents or operate any machinery for 24 hours (because of the anesthesia used during the test).  NUTRITION Drink plenty of fluids.  You may resume your normal diet.  Begin with a light meal and progress to your normal diet.  Avoid alcoholic beverages for 24 hours or as instructed by your caregiver.  MEDICATIONS You may resume your normal medications unless your caregiver tells you otherwise.  WHAT YOU CAN EXPECT TODAY You may experience abdominal discomfort such as a feeling of fullness or "gas" pains.  FOLLOW-UP Your doctor will discuss the results of your test with you.  SEEK IMMEDIATE MEDICAL ATTENTION IF ANY OF THE FOLLOWING OCCUR: Excessive nausea (feeling sick to your stomach) and/or vomiting.  Severe abdominal pain and distention (swelling).  Trouble swallowing.  Temperature over 101 F (37.8 C).  Rectal bleeding or vomiting of blood.      Colonoscopy Discharge Instructions  Read the instructions outlined below and refer to this sheet in the next few weeks. These discharge instructions provide you  with general information on caring for yourself after you leave the hospital. Your doctor may also give you specific instructions. While your treatment has been planned according to the most current medical practices available, unavoidable complications occasionally occur.   ACTIVITY You may resume your regular activity, but move at a slower pace for the next 24 hours.  Take frequent rest periods for the next 24 hours.  Walking will help get rid of the air and reduce the bloated feeling in your belly (abdomen).  No driving for 24 hours (because of the medicine (anesthesia) used during the test).   Do not sign any important legal documents or operate any machinery for 24 hours (because of the anesthesia used during the test).  NUTRITION Drink plenty of fluids.  You may resume your normal diet as instructed by your doctor.  Begin with a light meal and progress to your normal diet. Heavy or fried foods are harder to digest and may make you feel sick to your stomach (nauseated).  Avoid alcoholic beverages for 24 hours or as instructed.  MEDICATIONS You may resume your normal medications unless your doctor tells you otherwise.  WHAT YOU CAN EXPECT TODAY Some feelings of bloating in the abdomen.  Passage of more gas than usual.  Spotting of blood in your stool or on the toilet paper.  IF YOU HAD POLYPS REMOVED DURING THE COLONOSCOPY: No aspirin products for 7 days or as instructed.  No alcohol for 7 days or as instructed.  Eat a soft diet for the next 24 hours.  FINDING OUT THE RESULTS OF YOUR TEST Not all test results  are available during your visit. If your test results are not back during the visit, make an appointment with your caregiver to find out the results. Do not assume everything is normal if you have not heard from your caregiver or the medical facility. It is important for you to follow up on all of your test results.  SEEK IMMEDIATE MEDICAL ATTENTION IF: You have more than a  spotting of blood in your stool.  Your belly is swollen (abdominal distention).  You are nauseated or vomiting.  You have a temperature over 101.  You have abdominal pain or discomfort that is severe or gets worse throughout the day.   Your EGD revealed mild amount inflammation in your stomach.  I took biopsies of this to rule out infection with a bacteria called H. pylori.  Await pathology results, my office will contact you.  You had a tightening of your esophagus called a Schatzki's ring.  I did stretch this out today.  Hopefully this helps with your swallowing.  I am going to start you on a new medication called pantoprazole 40 mg twice daily.  I have sent this to your pharmacy.  Your colonoscopy revealed 12 polyp(s) which I removed successfully. Await pathology results, my office will contact you. I recommend repeating colonoscopy in 1 year for surveillance purposes.   Follow-up with GI in 2 to 3 months  I hope you have a great rest of your week!  Hennie Duos. Marletta Lor, D.O. Gastroenterology and Hepatology [facility]

## 2023-01-24 NOTE — Op Note (Signed)
[facility] Patient Name: Fremont Laroche Procedure Date: 01/24/2023 10:39 AM MRN: [id number] Date of Birth: [birth year] Attending MD: Hennie Duos. Maple Mirza, [id number] CSN: [id number] Age: 62 Admit Type: Outpatient Procedure:                Colonoscopy Indications:              Screening in patient at increased risk: Colorectal                            cancer in brother before age 88 Providers:                Hennie Duos. Marletta Lor, DO, Crystal Page, Durwin Glaze Tech, Technician Referring MD:              Medicines:                See the Anesthesia note for documentation of the                            administered medications Complications:            No immediate complications. Estimated Blood Loss:     Estimated blood loss was minimal. Procedure:                Pre-Anesthesia Assessment:                           - The anesthesia plan was to use monitored                            anesthesia care (MAC).                           After obtaining informed consent, the colonoscope                            was passed under direct vision. Throughout the                            procedure, the patient's blood pressure, pulse, and                            oxygen saturations were monitored continuously. The                            [id number]-HQ190L ([id number]) scope was introduced through                            the anus and advanced to the the cecum, identified                            by appendiceal orifice and ileocecal valve. The                            colonoscopy was performed  without difficulty. The                            patient tolerated the procedure well. Scope In: 11:06:04 AM Scope Out: 11:42:29 AM Scope Withdrawal Time: 0 hours 24 minutes 32 seconds  Total Procedure Duration: 0 hours 36 minutes 25 seconds  Findings:      Non-bleeding internal hemorrhoids were found during endoscopy.      Seven sessile polyps were found in  the ascending colon and cecum. The       polyps were 5 to 13 mm in size. These polyps were removed with a cold       snare. Resection and retrieval were complete.      Four sessile polyps were found in the descending colon and transverse       colon. The polyps were 4 to 8 mm in size. These polyps were removed with       a cold snare. Resection and retrieval were complete.      A 5 mm polyp was found in the rectum. The polyp was sessile. The polyp       was removed with a cold snare. Resection and retrieval were complete. Impression:               - Non-bleeding internal hemorrhoids.                           - Seven 5 to 13 mm polyps in the ascending colon                            and in the cecum, removed with a cold snare.                            Resected and retrieved.                           - Four 4 to 8 mm polyps in the descending colon and                            in the transverse colon, removed with a cold snare.                            Resected and retrieved.                           - One 5 mm polyp in the rectum, removed with a cold                            snare. Resected and retrieved. Moderate Sedation:      Per Anesthesia Care Recommendation:           - Patient has a contact number available for                            emergencies. The signs and symptoms of potential  delayed complications were discussed with the                            patient. Return to normal activities tomorrow.                            Written discharge instructions were provided to the                            patient.                           - Resume previous diet.                           - Continue present medications.                           - Await pathology results.                           - Repeat colonoscopy in 1 year for surveillance.                           - Return to GI clinic in 3 months. Procedure Code(s):        ---  Professional ---                           [phone], Colonoscopy, flexible; with removal of                            tumor(s), polyp(s), or other lesion(s) by snare                            technique Diagnosis Code(s):        --- Professional ---                           Z80.0, Family history of malignant neoplasm of                            digestive organs                           D12.2, Benign neoplasm of ascending colon                           D12.0, Benign neoplasm of cecum                           D12.4, Benign neoplasm of descending colon                           D12.3, Benign neoplasm of transverse colon (hepatic                            flexure or splenic flexure)  D12.8, Benign neoplasm of rectum                           K64.8, Other hemorrhoids CPT copyright 2022 American Medical Association. All rights reserved. The codes documented in this report are preliminary and upon coder review may  be revised to meet current compliance requirements. Hennie Duos. Marletta Lor, DO Hennie Duos. Marletta Lor, DO 01/24/2023 11:50:11 AM This report has been signed electronically. Number of Addenda: 0

## 2023-01-24 NOTE — Transfer of Care (Signed)
Immediate Anesthesia Transfer of Care Note  Patient: Craig [name]  Procedure(s) Performed: COLONOSCOPY WITH PROPOFOL ESOPHAGOGASTRODUODENOSCOPY (EGD) WITH PROPOFOL BALLOON DILATION BIOPSY POLYPECTOMY INTESTINAL  Patient Location: Short Stay  Anesthesia Type:General  Level of Consciousness: awake  Airway & Oxygen Therapy: Patient Spontanous Breathing  Post-op Assessment: Report given to RN and Post -op Vital signs reviewed and stable  Post vital signs: Reviewed and stable  Last Vitals:  Vitals Value Taken Time  BP 79/52 01/24/23 1146  Temp 36.4 C 01/24/23 1146  Pulse 71 01/24/23 1148  Resp 23 01/24/23 1148  SpO2 95 % 01/24/23 1148  Vitals shown include unfiled device data.  Last Pain:  Vitals:   01/24/23 1146  TempSrc: Oral  PainSc:          Complications: No notable events documented.

## 2023-01-25 LAB — SURGICAL PATHOLOGY

## 2023-01-25 NOTE — Anesthesia Postprocedure Evaluation (Signed)
Anesthesia Post Note  Patient: Craig [name]  Procedure(s) Performed: COLONOSCOPY WITH PROPOFOL ESOPHAGOGASTRODUODENOSCOPY (EGD) WITH PROPOFOL BALLOON DILATION BIOPSY POLYPECTOMY INTESTINAL  Patient location during evaluation: Phase II Anesthesia Type: General Level of consciousness: awake Pain management: pain level controlled Vital Signs Assessment: post-procedure vital signs reviewed and stable Respiratory status: spontaneous breathing and respiratory function stable Cardiovascular status: blood pressure returned to baseline and stable Postop Assessment: no headache and no apparent nausea or vomiting Anesthetic complications: no Comments: Late entry   No notable events documented.   Last Vitals:  Vitals:   01/24/23 1146 01/24/23 1148  BP: (!) 79/52 (!) 89/48  Pulse: 71 71  Resp: (!) 21 (!) 23  Temp: (!) 36.4 C   SpO2: 95% 95%    Last Pain:  Vitals:   01/24/23 1146  TempSrc: Oral  PainSc:                  Windell Norfolk

## 2023-01-26 ENCOUNTER — Encounter (HOSPITAL_COMMUNITY): Payer: Self-pay | Admitting: Internal Medicine

## 2023-03-24 ENCOUNTER — Encounter: Payer: Self-pay | Admitting: Gastroenterology

## 2023-08-10 ENCOUNTER — Ambulatory Visit: Payer: Medicare Other | Admitting: Podiatry

## 2023-08-17 ENCOUNTER — Ambulatory Visit (INDEPENDENT_AMBULATORY_CARE_PROVIDER_SITE_OTHER): Admitting: Podiatry

## 2023-08-17 ENCOUNTER — Encounter: Payer: Self-pay | Admitting: Podiatry

## 2023-08-17 DIAGNOSIS — M79674 Pain in right toe(s): Secondary | ICD-10-CM | POA: Diagnosis not present

## 2023-08-17 DIAGNOSIS — B351 Tinea unguium: Secondary | ICD-10-CM

## 2023-08-17 DIAGNOSIS — M79675 Pain in left toe(s): Secondary | ICD-10-CM

## 2023-08-17 NOTE — Progress Notes (Signed)
 Patient presents subjective:   Patient ID: Craig [name], male   DOB: 63 y.o.   MRN: [id number]   HPI Stating he has very thickened nailbeds 1-5 both feet that are impossible for him to cut they get sore and he is tried to trim himself with patient smoking a half a pack of cigarettes per day not well oriented   Review of Systems  All other systems reviewed and are negative.       Objective:  Physical Exam Vitals and nursing note reviewed.  Constitutional:      Appearance: He is well-developed.  Pulmonary:     Effort: Pulmonary effort is normal.  Musculoskeletal:        General: Normal range of motion.  Skin:    General: Skin is warm.  Neurological:     Mental Status: He is alert.     Neurovascular status was found to be intact muscle strength is adequate range of motion adequate with patient found to have severely elongated thickened nailbeds 1-5 both feet dystrophic painful when pressed and impossible for him to cut.  Good digital perfusion well-oriented     Assessment:  Chronic mycotic nail infection 1-5 both feet with pain     Plan:  H&P reviewed discussed condition and debrided nailbeds 1-5 both feet no intra genic bleeding which can be done as needed routine

## 2024-01-17 ENCOUNTER — Encounter (INDEPENDENT_AMBULATORY_CARE_PROVIDER_SITE_OTHER): Payer: Self-pay | Admitting: *Deleted

## 2024-01-18 ENCOUNTER — Other Ambulatory Visit: Payer: Self-pay | Admitting: Internal Medicine

## 2024-04-09 ENCOUNTER — Ambulatory Visit (INDEPENDENT_AMBULATORY_CARE_PROVIDER_SITE_OTHER): Admitting: Podiatry

## 2024-04-09 ENCOUNTER — Encounter: Payer: Self-pay | Admitting: Podiatry

## 2024-04-09 DIAGNOSIS — B351 Tinea unguium: Secondary | ICD-10-CM

## 2024-04-09 DIAGNOSIS — M79674 Pain in right toe(s): Secondary | ICD-10-CM | POA: Diagnosis not present

## 2024-04-09 DIAGNOSIS — M79675 Pain in left toe(s): Secondary | ICD-10-CM | POA: Diagnosis not present

## 2024-04-09 NOTE — Progress Notes (Signed)
This patient returns to the office for evaluation and treatment of long thick painful nails .  This patient is unable to trim his own nails since the patient cannot reach his feet.  Patient says the nails are painful walking and wearing his shoes.  He returns for preventive foot care services.  General Appearance  Alert, conversant and in no acute stress.  Vascular  Dorsalis pedis and posterior tibial  pulses are palpable  bilaterally.  Capillary return is within normal limits  bilaterally. Temperature is within normal limits  bilaterally.  Neurologic  Senn-Weinstein monofilament wire test within normal limits  bilaterally. Muscle power within normal limits bilaterally.  Nails Thick disfigured discolored nails with subungual debris  from hallux to fifth toes bilaterally. No evidence of bacterial infection or drainage bilaterally.  Orthopedic  No limitations of motion  feet .  No crepitus or effusions noted.  No bony pathology or digital deformities noted.  Skin  normotropic skin with no porokeratosis noted bilaterally.  No signs of infections or ulcers noted.     Onychomycosis  Pain in toes right foot  Pain in toes left foot  Debridement  of nails  1-5  B/L with a nail nipper.  Nails were then filed using a dremel tool with no incidents.    RTC  3 months    Norita Meigs DPM  

## 2024-07-10 ENCOUNTER — Ambulatory Visit: Admitting: Podiatry
